# Patient Record
Sex: Female | Born: 1935 | Race: White | Hispanic: No | Smoking: Never smoker
Health system: Southern US, Community
[De-identification: ages and names within clinical notes are randomized; demographics above are authoritative.]

## PROBLEM LIST (undated history)

## (undated) DIAGNOSIS — C50919 Malignant neoplasm of unspecified site of unspecified female breast: Secondary | ICD-10-CM

## (undated) DIAGNOSIS — I4729 Other ventricular tachycardia: Secondary | ICD-10-CM

## (undated) DIAGNOSIS — Z923 Personal history of irradiation: Secondary | ICD-10-CM

## (undated) DIAGNOSIS — K449 Diaphragmatic hernia without obstruction or gangrene: Secondary | ICD-10-CM

## (undated) DIAGNOSIS — L539 Erythematous condition, unspecified: Secondary | ICD-10-CM

## (undated) DIAGNOSIS — I472 Ventricular tachycardia: Secondary | ICD-10-CM

## (undated) DIAGNOSIS — R58 Hemorrhage, not elsewhere classified: Secondary | ICD-10-CM

## (undated) DIAGNOSIS — K219 Gastro-esophageal reflux disease without esophagitis: Secondary | ICD-10-CM

## (undated) DIAGNOSIS — R918 Other nonspecific abnormal finding of lung field: Secondary | ICD-10-CM

## (undated) DIAGNOSIS — C50912 Malignant neoplasm of unspecified site of left female breast: Principal | ICD-10-CM

## (undated) DIAGNOSIS — I10 Essential (primary) hypertension: Secondary | ICD-10-CM

## (undated) DIAGNOSIS — D649 Anemia, unspecified: Secondary | ICD-10-CM

## (undated) DIAGNOSIS — Z79818 Long term (current) use of other agents affecting estrogen receptors and estrogen levels: Secondary | ICD-10-CM

## (undated) DIAGNOSIS — D509 Iron deficiency anemia, unspecified: Secondary | ICD-10-CM

## (undated) HISTORY — DX: Ventricular tachycardia: I47.2

## (undated) HISTORY — DX: Malignant neoplasm of unspecified site of left female breast: C50.912

## (undated) HISTORY — DX: Essential (primary) hypertension: I10

## (undated) HISTORY — DX: Diaphragmatic hernia without obstruction or gangrene: K44.9

## (undated) HISTORY — DX: Anemia, unspecified: D64.9

## (undated) HISTORY — DX: Iron deficiency anemia, unspecified: D50.9

## (undated) HISTORY — DX: Other ventricular tachycardia: I47.29

## (undated) HISTORY — DX: Malignant neoplasm of unspecified site of unspecified female breast: C50.919

## (undated) HISTORY — DX: Gastro-esophageal reflux disease without esophagitis: K21.9

## (undated) HISTORY — DX: Personal history of irradiation: Z92.3

---

## 2008-08-23 ENCOUNTER — Inpatient Hospital Stay (HOSPITAL_COMMUNITY): Admission: EM | Admit: 2008-08-23 | Discharge: 2008-08-25 | Payer: Self-pay | Admitting: Emergency Medicine

## 2008-08-23 ENCOUNTER — Ambulatory Visit: Payer: Self-pay | Admitting: Cardiology

## 2008-08-24 ENCOUNTER — Ambulatory Visit: Payer: Self-pay | Admitting: Oncology

## 2008-08-28 ENCOUNTER — Ambulatory Visit: Payer: Self-pay | Admitting: Oncology

## 2008-09-20 ENCOUNTER — Ambulatory Visit: Payer: Self-pay | Admitting: Gastroenterology

## 2008-12-26 ENCOUNTER — Encounter (HOSPITAL_COMMUNITY): Admission: RE | Admit: 2008-12-26 | Discharge: 2009-02-07 | Payer: Self-pay | Admitting: Oncology

## 2008-12-26 ENCOUNTER — Ambulatory Visit: Payer: Self-pay | Admitting: Oncology

## 2008-12-26 LAB — CBC WITH DIFFERENTIAL/PLATELET
Basophils Absolute: 0.1 10*3/uL (ref 0.0–0.1)
Eosinophils Absolute: 0 10*3/uL (ref 0.0–0.5)
HCT: 16.8 % — ABNORMAL LOW (ref 34.8–46.6)
HGB: 4.2 g/dL — CL (ref 11.6–15.9)
MONO#: 0.5 10*3/uL (ref 0.1–0.9)
NEUT%: 67.4 % (ref 38.4–76.8)
WBC: 6.4 10*3/uL (ref 3.9–10.3)
lymph#: 1.5 10*3/uL (ref 0.9–3.3)

## 2008-12-26 LAB — TYPE & CROSSMATCH - CHCC

## 2008-12-28 LAB — LACTATE DEHYDROGENASE: LDH: 107 U/L (ref 94–250)

## 2008-12-28 LAB — COMPREHENSIVE METABOLIC PANEL
Albumin: 4 g/dL (ref 3.5–5.2)
CO2: 22 mEq/L (ref 19–32)
Glucose, Bld: 93 mg/dL (ref 70–99)
Potassium: 4.3 mEq/L (ref 3.5–5.3)
Sodium: 136 mEq/L (ref 135–145)
Total Protein: 6.2 g/dL (ref 6.0–8.3)

## 2008-12-28 LAB — CEA: CEA: 1.5 ng/mL (ref 0.0–5.0)

## 2009-01-01 LAB — IRON AND TIBC

## 2009-01-02 ENCOUNTER — Encounter (INDEPENDENT_AMBULATORY_CARE_PROVIDER_SITE_OTHER): Payer: Self-pay | Admitting: Diagnostic Radiology

## 2009-01-02 ENCOUNTER — Other Ambulatory Visit: Admission: RE | Admit: 2009-01-02 | Discharge: 2009-01-02 | Payer: Self-pay | Admitting: Diagnostic Radiology

## 2009-01-02 ENCOUNTER — Encounter: Admission: RE | Admit: 2009-01-02 | Discharge: 2009-01-02 | Payer: Self-pay | Admitting: Oncology

## 2009-01-04 ENCOUNTER — Ambulatory Visit (HOSPITAL_COMMUNITY): Admission: RE | Admit: 2009-01-04 | Discharge: 2009-01-04 | Payer: Self-pay | Admitting: Oncology

## 2009-01-11 ENCOUNTER — Encounter: Admission: RE | Admit: 2009-01-11 | Discharge: 2009-01-11 | Payer: Self-pay | Admitting: Oncology

## 2009-01-16 LAB — IRON AND TIBC
%SAT: 18 % — ABNORMAL LOW (ref 20–55)
Iron: 61 ug/dL (ref 42–145)

## 2009-01-16 LAB — COMPREHENSIVE METABOLIC PANEL
Albumin: 3.9 g/dL (ref 3.5–5.2)
Alkaline Phosphatase: 82 U/L (ref 39–117)
BUN: 16 mg/dL (ref 6–23)
Calcium: 9.3 mg/dL (ref 8.4–10.5)
Creatinine, Ser: 0.72 mg/dL (ref 0.40–1.20)
Glucose, Bld: 94 mg/dL (ref 70–99)
Potassium: 4.9 mEq/L (ref 3.5–5.3)

## 2009-01-16 LAB — CBC WITH DIFFERENTIAL/PLATELET
BASO%: 1.1 % (ref 0.0–2.0)
EOS%: 2.3 % (ref 0.0–7.0)
HGB: 10 g/dL — ABNORMAL LOW (ref 11.6–15.9)
MCH: 26.7 pg (ref 25.1–34.0)
MCV: 84.8 fL (ref 79.5–101.0)
NEUT%: 62.9 % (ref 38.4–76.8)
lymph#: 1.6 10*3/uL (ref 0.9–3.3)

## 2009-01-16 LAB — FERRITIN: Ferritin: 794 ng/mL — ABNORMAL HIGH (ref 10–291)

## 2009-02-01 LAB — CBC WITH DIFFERENTIAL/PLATELET
BASO%: 0.3 % (ref 0.0–2.0)
EOS%: 1.3 % (ref 0.0–7.0)
HCT: 35.5 % (ref 34.8–46.6)
LYMPH%: 23.9 % (ref 14.0–49.7)
MCH: 29.9 pg (ref 25.1–34.0)
MCHC: 33.4 g/dL (ref 31.5–36.0)
NEUT%: 68.1 % (ref 38.4–76.8)
Platelets: 184 10*3/uL (ref 145–400)

## 2009-02-01 LAB — VITAMIN B12: Vitamin B-12: 296 pg/mL (ref 211–911)

## 2009-02-13 ENCOUNTER — Ambulatory Visit: Payer: Self-pay | Admitting: Gastroenterology

## 2009-02-13 DIAGNOSIS — R195 Other fecal abnormalities: Secondary | ICD-10-CM | POA: Insufficient documentation

## 2009-02-27 ENCOUNTER — Ambulatory Visit: Payer: Self-pay | Admitting: Gastroenterology

## 2009-02-27 ENCOUNTER — Ambulatory Visit: Payer: Self-pay | Admitting: Oncology

## 2009-03-01 LAB — COMPREHENSIVE METABOLIC PANEL
Albumin: 3.9 g/dL (ref 3.5–5.2)
Alkaline Phosphatase: 78 U/L (ref 39–117)
BUN: 13 mg/dL (ref 6–23)
Creatinine, Ser: 0.74 mg/dL (ref 0.40–1.20)
Glucose, Bld: 132 mg/dL — ABNORMAL HIGH (ref 70–99)
Total Bilirubin: 0.2 mg/dL — ABNORMAL LOW (ref 0.3–1.2)

## 2009-03-01 LAB — CBC WITH DIFFERENTIAL/PLATELET
Basophils Absolute: 0 10*3/uL (ref 0.0–0.1)
EOS%: 1.5 % (ref 0.0–7.0)
Eosinophils Absolute: 0.1 10*3/uL (ref 0.0–0.5)
HGB: 11.7 g/dL (ref 11.6–15.9)
LYMPH%: 30.1 % (ref 14.0–49.7)
MCH: 30.8 pg (ref 25.1–34.0)
MCV: 94.5 fL (ref 79.5–101.0)
MONO%: 8.3 % (ref 0.0–14.0)
NEUT#: 2.8 10*3/uL (ref 1.5–6.5)
Platelets: 163 10*3/uL (ref 145–400)
RBC: 3.8 10*6/uL (ref 3.70–5.45)

## 2009-03-01 LAB — IRON AND TIBC
%SAT: 25 % (ref 20–55)
Iron: 70 ug/dL (ref 42–145)
UIBC: 214 ug/dL

## 2009-03-01 LAB — FERRITIN: Ferritin: 348 ng/mL — ABNORMAL HIGH (ref 10–291)

## 2009-04-03 ENCOUNTER — Ambulatory Visit: Payer: Self-pay | Admitting: Oncology

## 2009-05-03 ENCOUNTER — Ambulatory Visit: Payer: Self-pay | Admitting: Oncology

## 2009-05-03 LAB — COMPREHENSIVE METABOLIC PANEL
BUN: 18 mg/dL (ref 6–23)
CO2: 22 mEq/L (ref 19–32)
Creatinine, Ser: 0.75 mg/dL (ref 0.40–1.20)
Glucose, Bld: 85 mg/dL (ref 70–99)
Total Bilirubin: 0.3 mg/dL (ref 0.3–1.2)

## 2009-05-03 LAB — CBC WITH DIFFERENTIAL/PLATELET
Eosinophils Absolute: 0.1 10*3/uL (ref 0.0–0.5)
HCT: 37 % (ref 34.8–46.6)
LYMPH%: 29.3 % (ref 14.0–49.7)
MCHC: 33.9 g/dL (ref 31.5–36.0)
MCV: 96.7 fL (ref 79.5–101.0)
MONO#: 0.4 10*3/uL (ref 0.1–0.9)
NEUT#: 3 10*3/uL (ref 1.5–6.5)
NEUT%: 60.8 % (ref 38.4–76.8)
Platelets: 231 10*3/uL (ref 145–400)
WBC: 5 10*3/uL (ref 3.9–10.3)

## 2009-05-03 LAB — IRON AND TIBC
Iron: 71 ug/dL (ref 42–145)
TIBC: 295 ug/dL (ref 250–470)

## 2009-05-03 LAB — LACTATE DEHYDROGENASE: LDH: 131 U/L (ref 94–250)

## 2009-05-03 LAB — FERRITIN: Ferritin: 251 ng/mL (ref 10–291)

## 2009-07-10 ENCOUNTER — Ambulatory Visit: Payer: Self-pay | Admitting: Oncology

## 2009-07-12 LAB — CBC WITH DIFFERENTIAL/PLATELET
Basophils Absolute: 0 10*3/uL (ref 0.0–0.1)
Eosinophils Absolute: 0.1 10*3/uL (ref 0.0–0.5)
HCT: 36.1 % (ref 34.8–46.6)
HGB: 12.5 g/dL (ref 11.6–15.9)
LYMPH%: 26 % (ref 14.0–49.7)
MCV: 98.3 fL (ref 79.5–101.0)
MONO#: 0.4 10*3/uL (ref 0.1–0.9)
MONO%: 7.2 % (ref 0.0–14.0)
NEUT#: 3.9 10*3/uL (ref 1.5–6.5)
NEUT%: 65.2 % (ref 38.4–76.8)
Platelets: 237 10*3/uL (ref 145–400)
RBC: 3.68 10*6/uL — ABNORMAL LOW (ref 3.70–5.45)
WBC: 6 10*3/uL (ref 3.9–10.3)

## 2009-07-13 LAB — COMPREHENSIVE METABOLIC PANEL
BUN: 14 mg/dL (ref 6–23)
CO2: 26 mEq/L (ref 19–32)
Glucose, Bld: 93 mg/dL (ref 70–99)
Sodium: 138 mEq/L (ref 135–145)
Total Bilirubin: 0.3 mg/dL (ref 0.3–1.2)
Total Protein: 6.6 g/dL (ref 6.0–8.3)

## 2009-07-13 LAB — LACTATE DEHYDROGENASE: LDH: 126 U/L (ref 94–250)

## 2009-07-13 LAB — IRON AND TIBC
%SAT: 23 % (ref 20–55)
Iron: 75 ug/dL (ref 42–145)
UIBC: 252 ug/dL

## 2009-09-18 ENCOUNTER — Ambulatory Visit: Payer: Self-pay | Admitting: Oncology

## 2009-10-16 ENCOUNTER — Ambulatory Visit: Payer: Self-pay | Admitting: Oncology

## 2009-10-18 LAB — CBC WITH DIFFERENTIAL/PLATELET
Basophils Absolute: 0 10*3/uL (ref 0.0–0.1)
Eosinophils Absolute: 0.1 10*3/uL (ref 0.0–0.5)
HCT: 37.8 % (ref 34.8–46.6)
HGB: 12.4 g/dL (ref 11.6–15.9)
MCH: 32.1 pg (ref 25.1–34.0)
MCV: 97.9 fL (ref 79.5–101.0)
MONO%: 7.5 % (ref 0.0–14.0)
NEUT#: 3.1 10*3/uL (ref 1.5–6.5)
NEUT%: 59.2 % (ref 38.4–76.8)
RDW: 12.7 % (ref 11.2–14.5)

## 2009-10-18 LAB — COMPREHENSIVE METABOLIC PANEL
Albumin: 4.1 g/dL (ref 3.5–5.2)
Alkaline Phosphatase: 71 U/L (ref 39–117)
BUN: 22 mg/dL (ref 6–23)
Calcium: 8.8 mg/dL (ref 8.4–10.5)
Chloride: 99 mEq/L (ref 96–112)
Creatinine, Ser: 0.79 mg/dL (ref 0.40–1.20)
Glucose, Bld: 134 mg/dL — ABNORMAL HIGH (ref 70–99)
Potassium: 4.9 mEq/L (ref 3.5–5.3)

## 2009-10-18 LAB — IRON AND TIBC
Iron: 49 ug/dL (ref 42–145)
TIBC: 354 ug/dL (ref 250–470)
UIBC: 305 ug/dL

## 2009-11-18 ENCOUNTER — Ambulatory Visit: Payer: Self-pay | Admitting: Oncology

## 2009-12-06 LAB — FERRITIN: Ferritin: 42 ng/mL (ref 10–291)

## 2010-01-08 ENCOUNTER — Ambulatory Visit: Payer: Self-pay | Admitting: Oncology

## 2010-02-07 ENCOUNTER — Ambulatory Visit: Payer: Self-pay | Admitting: Oncology

## 2010-02-07 LAB — CBC WITH DIFFERENTIAL/PLATELET
BASO%: 0.3 % (ref 0.0–2.0)
Eosinophils Absolute: 0.1 10*3/uL (ref 0.0–0.5)
HCT: 32.3 % — ABNORMAL LOW (ref 34.8–46.6)
LYMPH%: 25 % (ref 14.0–49.7)
MCHC: 32.7 g/dL (ref 31.5–36.0)
MONO#: 0.5 10*3/uL (ref 0.1–0.9)
NEUT%: 65.1 % (ref 38.4–76.8)
Platelets: 307 10*3/uL (ref 145–400)
WBC: 5.6 10*3/uL (ref 3.9–10.3)

## 2010-02-07 LAB — COMPREHENSIVE METABOLIC PANEL
BUN: 15 mg/dL (ref 6–23)
CO2: 27 mEq/L (ref 19–32)
Calcium: 8.7 mg/dL (ref 8.4–10.5)
Chloride: 105 mEq/L (ref 96–112)
Creatinine, Ser: 0.74 mg/dL (ref 0.40–1.20)
Glucose, Bld: 81 mg/dL (ref 70–99)

## 2010-02-07 LAB — IRON AND TIBC
Iron: 23 ug/dL — ABNORMAL LOW (ref 42–145)
TIBC: 358 ug/dL (ref 250–470)
UIBC: 335 ug/dL

## 2010-02-07 LAB — FERRITIN: Ferritin: 18 ng/mL (ref 10–291)

## 2010-02-28 LAB — CBC WITH DIFFERENTIAL/PLATELET
HCT: 30.6 % — ABNORMAL LOW (ref 34.8–46.6)
MCH: 33.9 pg (ref 25.1–34.0)
MCHC: 33.3 g/dL (ref 31.5–36.0)
MONO%: 9.3 % (ref 0.0–14.0)
Platelets: 275 10*3/uL (ref 145–400)

## 2010-02-28 LAB — FERRITIN: Ferritin: 30 ng/mL (ref 10–291)

## 2010-05-07 ENCOUNTER — Ambulatory Visit: Payer: Self-pay | Admitting: Oncology

## 2010-06-24 ENCOUNTER — Ambulatory Visit: Payer: Self-pay | Admitting: Oncology

## 2010-06-27 ENCOUNTER — Ambulatory Visit (HOSPITAL_COMMUNITY)
Admission: RE | Admit: 2010-06-27 | Discharge: 2010-06-27 | Payer: Self-pay | Source: Home / Self Care | Admitting: Oncology

## 2010-06-27 LAB — CBC WITH DIFFERENTIAL/PLATELET
Basophils Absolute: 0 10*3/uL (ref 0.0–0.1)
Eosinophils Absolute: 0.1 10*3/uL (ref 0.0–0.5)
HGB: 12.6 g/dL (ref 11.6–15.9)
LYMPH%: 24.5 % (ref 14.0–49.7)
MCV: 97.7 fL (ref 79.5–101.0)
MONO%: 8.2 % (ref 0.0–14.0)
NEUT#: 3.8 10*3/uL (ref 1.5–6.5)
NEUT%: 64.5 % (ref 38.4–76.8)
Platelets: 235 10*3/uL (ref 145–400)

## 2010-06-30 LAB — COMPREHENSIVE METABOLIC PANEL
Albumin: 3.8 g/dL (ref 3.5–5.2)
Alkaline Phosphatase: 75 U/L (ref 39–117)
BUN: 17 mg/dL (ref 6–23)
Creatinine, Ser: 0.79 mg/dL (ref 0.40–1.20)
Glucose, Bld: 117 mg/dL — ABNORMAL HIGH (ref 70–99)
Total Bilirubin: 0.2 mg/dL — ABNORMAL LOW (ref 0.3–1.2)

## 2010-06-30 LAB — IRON AND TIBC
%SAT: 34 % (ref 20–55)
Iron: 120 ug/dL (ref 42–145)
UIBC: 231 ug/dL

## 2010-06-30 LAB — FERRITIN: Ferritin: 38 ng/mL (ref 10–291)

## 2010-07-04 ENCOUNTER — Encounter: Admission: RE | Admit: 2010-07-04 | Discharge: 2010-07-04 | Payer: Self-pay | Admitting: Oncology

## 2010-09-03 ENCOUNTER — Ambulatory Visit: Payer: Self-pay | Admitting: Oncology

## 2010-09-05 ENCOUNTER — Encounter: Payer: Self-pay | Admitting: Gastroenterology

## 2010-09-05 LAB — CBC WITH DIFFERENTIAL/PLATELET
BASO%: 1.3 % (ref 0.0–2.0)
Basophils Absolute: 0.1 10*3/uL (ref 0.0–0.1)
EOS%: 2.2 % (ref 0.0–7.0)
HGB: 12.8 g/dL (ref 11.6–15.9)
MCH: 33.5 pg (ref 25.1–34.0)
MONO#: 0.5 10*3/uL (ref 0.1–0.9)
RDW: 13 % (ref 11.2–14.5)
WBC: 5.9 10*3/uL (ref 3.9–10.3)
lymph#: 1.6 10*3/uL (ref 0.9–3.3)

## 2010-09-08 LAB — COMPREHENSIVE METABOLIC PANEL
AST: 16 U/L (ref 0–37)
Albumin: 4.1 g/dL (ref 3.5–5.2)
Alkaline Phosphatase: 75 U/L (ref 39–117)
Chloride: 102 mEq/L (ref 96–112)
Potassium: 4.8 mEq/L (ref 3.5–5.3)
Sodium: 136 mEq/L (ref 135–145)
Total Protein: 6.3 g/dL (ref 6.0–8.3)

## 2010-09-08 LAB — VITAMIN D 25 HYDROXY (VIT D DEFICIENCY, FRACTURES): Vit D, 25-Hydroxy: 78 ng/mL (ref 30–89)

## 2010-09-08 LAB — TRANSFERRIN RECEPTOR, SOLUABLE: Transferrin Receptor, Soluble: 13.6 nmol/L

## 2010-09-08 LAB — IRON AND TIBC: %SAT: 20 % (ref 20–55)

## 2010-10-26 ENCOUNTER — Encounter (HOSPITAL_COMMUNITY): Payer: Self-pay | Admitting: Oncology

## 2010-11-04 NOTE — Letter (Signed)
Summary: [facility]  [facility]   Imported By: Sherian Rein 09/12/2010 07:43:02  _____________________________________________________________________  External Attachment:    Type:   Image     Comment:   External Document

## 2010-12-05 ENCOUNTER — Encounter (HOSPITAL_BASED_OUTPATIENT_CLINIC_OR_DEPARTMENT_OTHER): Payer: Medicare Other | Admitting: Oncology

## 2010-12-05 ENCOUNTER — Other Ambulatory Visit (HOSPITAL_COMMUNITY): Payer: Self-pay | Admitting: Oncology

## 2010-12-05 DIAGNOSIS — D509 Iron deficiency anemia, unspecified: Secondary | ICD-10-CM

## 2010-12-05 DIAGNOSIS — C50919 Malignant neoplasm of unspecified site of unspecified female breast: Secondary | ICD-10-CM

## 2010-12-05 DIAGNOSIS — D649 Anemia, unspecified: Secondary | ICD-10-CM

## 2010-12-05 DIAGNOSIS — Z170 Estrogen receptor positive status [ER+]: Secondary | ICD-10-CM

## 2010-12-05 LAB — CBC WITH DIFFERENTIAL/PLATELET
Basophils Absolute: 0 10*3/uL (ref 0.0–0.1)
EOS%: 1.3 % (ref 0.0–7.0)
MCH: 33.3 pg (ref 25.1–34.0)
MCHC: 33.6 g/dL (ref 31.5–36.0)
MCV: 99.2 fL (ref 79.5–101.0)
MONO%: 7.1 % (ref 0.0–14.0)
RBC: 3.65 10*6/uL — ABNORMAL LOW (ref 3.70–5.45)
RDW: 13.2 % (ref 11.2–14.5)

## 2010-12-10 LAB — COMPREHENSIVE METABOLIC PANEL
ALT: 13 U/L (ref 0–35)
AST: 19 U/L (ref 0–37)
Albumin: 4.2 g/dL (ref 3.5–5.2)
BUN: 18 mg/dL (ref 6–23)
CO2: 26 mEq/L (ref 19–32)
Calcium: 8.8 mg/dL (ref 8.4–10.5)
Chloride: 100 mEq/L (ref 96–112)
Creatinine, Ser: 0.84 mg/dL (ref 0.40–1.20)
Potassium: 4.5 mEq/L (ref 3.5–5.3)

## 2010-12-10 LAB — VITAMIN B12: Vitamin B-12: 643 pg/mL (ref 211–911)

## 2010-12-10 LAB — IRON AND TIBC: UIBC: 247 ug/dL

## 2010-12-10 LAB — VITAMIN D 25 HYDROXY (VIT D DEFICIENCY, FRACTURES): Vit D, 25-Hydroxy: 66 ng/mL (ref 30–89)

## 2010-12-10 LAB — LACTATE DEHYDROGENASE: LDH: 135 U/L (ref 94–250)

## 2011-01-15 LAB — CROSSMATCH: Antibody Screen: NEGATIVE

## 2011-01-15 LAB — ABO/RH: ABO/RH(D): A POS

## 2011-02-17 NOTE — Consult Note (Signed)
NAMECHAU, SAVELL NO.:  [id number]   MEDICAL RECORD NO.:  [id number]          PATIENT TYPE:  INP   LOCATION:  2036                         FACILITY:  [facility]   PHYSICIAN:  Samul Dada, M.D.DATE OF BIRTH:  13-Feb-1922   DATE OF CONSULTATION:  08/24/2008  DATE OF DISCHARGE:  08/25/2008                                 CONSULTATION   Room number 6527.   CONSULTING PHYSICIAN:  Dr. Arline Asp.   REASON FOR CONSULTATION:  Breast masses.   REFERRING PHYSICIAN:  Dr. Karilyn Cota.   HISTORY OF PRESENT ILLNESS:  Ms. Rozman is an 77 year old woman, with no  prior medical history, who presented to her [facility]  physicians with increased weakness for 1 week, fatigue and very short of  breath with minimal exertion.  She also complained of a hematoma in the  right breast, since her dog had jumped on her about 2 months ago.  She  stated that once the bruising resolved, a mass appeared in that breast.  A chest x-ray and a CT of the chest with contrast on August 23, 2008  revealed oval fluid density structures, superficial to the right  pectoralis muscle, immediately abutting a 7x5.2-cm heterogeneous right  breast mass, as well as the lobulated 3.8 x 3.2 cm left central breast  mass.  Left axillary lymphadenopathy noted with dominant lymph node  measuring 1.9 cm.  There is bilateral pleural effusion, and the right  upper lobe 4 mm nodule of unknown significance.  In the left lung, no  masses are seen.  No lytic or sclerotic lesions.  Of note, she was also  found to be severely anemic with a hemoglobin at [facility]  of 3.7, with a repeat hemoglobin of 4 once admitted to [facility].  To this date, she received 4 units of packed RBCs, with some  improvement.  Her hemoccult was negative.  Prior to that at the  [facility] we were asked to see her, with recommendations  regarding her care.   PAST MEDICAL HISTORY:  1. GERD - hiatal  hernia.  2. No diagnosis of hypertension this hospitalization.  3. Status post one run of VTach during this hospitalization.   SURGERIES:  None.   ALLERGIES:  NKDA.   MEDICATIONS:  Lasix as directed, Protonix 40 mg b.i.d., K-Dur 40 mEq as  directed, Zofran 4 mg q.6h.   REVIEW OF SYSTEMS:  Essentially negative with the exception of dyspnea  on exertion, GERD symptoms fatigue. Denying any fever, chills, night  sweats, headaches, confusion, double vision, or dysphagia.  No cough,  chest pain or abdominal pain.  No nausea, vomiting, diarrhea or  constipation.  Denies any blood in the stools or hematuria.  No dysuria.  No back pain, numbness or edema.  No weight loss or failure to thrive.  She admits to eating significant amounts of crushed ice.  She denies any  significant caffeine intake.   FAMILY HISTORY:  Mother died of old age at 55.  Father died of old age.  Her family history is noncontributory for  any oncologic issues or blood  disorders.   SOCIAL HISTORY:  The patient is widowed.  She has four children, two  daughters and two sons.  Supportive family.  She lives alone.  No  tobacco or alcohol history.  Lives in [place], [place].  She is  full code. On health maintenance, she has never had a colonoscopy or  EGD.  No mammogram for many years.  She has not been followed up by a  primary care physician for a long period of time.   PHYSICAL EXAM:  GENERAL APPEARANCE:  This is a well-developed, well-  nourished 77 year old white female in no acute distress, alert and  oriented x3, who looks younger than her stated age.  VITAL SIGNS:  Blood pressure 154/76, pulse 82, respirations 23,  temperature 98.2, pulse oximetry 98% on room air.  HEENT:  Normocephalic, atraumatic.  Sclerae are anicteric.  Oral cavity  without lesions or thrush.  NECK:  Supple.  No cervical or supraclavicular masses.  LUNGS:  Remarkable for right wheezing, and decreased breath sounds at  the bases.  No  rhonchi or rales.  No axillary masses palpable despite  the known left axillary lymphadenopathy per CT.  CARDIOVASCULAR:  Regular rate and rhythm without murmurs, rubs or  gallops.  ABDOMEN:  Soft, nontender.  Bowel sounds x4.  No hepatosplenomegaly.  EXTREMITIES:  With no clubbing or cyanosis.  No edema.  No inguinal  masses.  SKIN:  Remarkable for mild bruising at the puncture sites. No petechial  rash.  No open lesions.  BREASTS:  Remarkable for a 15x10-cm disk discrete right mass extending  from the axillary area to below the nipple.  There is no nipple  retraction.  No nipple discharge.  On the left, there is also a mass of  5x5-cm, with nipple inversion and skin tightening in the area.  There is  a mole above that nipple, suspicious.  No discharge.  GU and RECTAL:  Deferred.  MUSCULOSKELETAL:  No spinal tenderness.  NEURO:  Nonfocal.   LABS:  Hemoglobin 11.9, was 4 on admission. Hematocrit 37.4 and was 13.5  on admission. White count 9.5, platelets 297, MCV 78.2, was 61  initially. Sodium 138, potassium 3.9, BUN 9, creatinine 0.71, glucose  109, total bilirubin  0.2, alkaline phosphatase 65, AST 17, ALT 9, total  protein 5.2, albumin 3, calcium 8.5, hemoccult negative.   ASSESSMENT/PLAN:  Dr. Arline Asp has seen and evaluated the patient and  reviewed the chart.  He discussed the case with her and her adult  children.  The patient has 3 main problems.  1. Iron-deficiency anemia status post 4 units of packed red blood      cells.  The patient needs a gastrointestinal workup despite the      negative stool x1.  This can be done as an outpatient.  2. Clinical left breast cancer with positive lymph nodes on physical      exam and chest computed tomography.  The mass in the left upper      quadrant is 5x5-cm with nipple inversion and skin tightening.  She      needs workup at the breast center.  We recommend mammogram,      ultrasound, and biopsy.  3. Right breast mass 10 x 15 cm,  fluctuant, hopefully benign.  May be      resolving hematoma but we need to rule out cancer asper #2.  Dr.      Arline Asp spoke with the [facility]  physician.  The patient is      alright to be      discharged from our perspective, and can be followed at the cancer      center.  In the meantime, LDH, CEA and CA 27, 29 will be ordered.      In addition, would recommend for her anemia to order an anemia      panel.   Thank you very much for allowing [place] the opportunity to participate in  the care of this nice patient.      Marlowe Kays, P.A.      Samul Dada, M.D.  Electronically Signed    SW/MEDQ  D:  08/27/2008  T:  08/27/2008  Job:  [id number]   cc:   Samul Dada, M.D.

## 2011-02-17 NOTE — Discharge Summary (Signed)
[name], [name]                 ACCOUNT NO.:  [id number]   MEDICAL RECORD NO.:  [id number]          PATIENT TYPE:  INP   LOCATION:  2036                         FACILITY:  [facility]   PHYSICIAN:  Michelene Gardener, MD    DATE OF BIRTH:  [birth year]   DATE OF ADMISSION:  08/23/2008  DATE OF DISCHARGE:  08/25/2008                               DISCHARGE SUMMARY   DISCHARGE DIAGNOSES:  1. Breast masses bilaterally.  The left one is consistent with a      clinical breast cancer and the right one is more suggestive of a      benign tumor.  2. Severe anemia.  3. Hypertension.  4. Nonsustained ventricular tachycardia.  5. Hypertension.   DISCHARGE MEDICATIONS:  1. Metoprolol 25 mg twice daily.  2. Aspirin 81 mg once a day.  3. Prilosec 20 mg once a day.   CONSULTATIONS:  1. Oncology consult.  2. Cardiology consult.   PROCEDURES:  None.   RADIOLOGY STUDIES:  1. Chest x-ray on August 23, 2008, showed cardiomegaly with      bibasilar atelectasis and hiatal hernia.  2. CT scan of the chest without contrast on August 23, 2008, showed      bilateral breast masses, which is worrisome for breast cancer, and      it showed left axillary lymphadenopathy, which is worrisome for      metastasis and small bilateral pleural effusion and right upper      lobe nodules.  Follow up with Oncology next week.   COURSE OF HOSPITALIZATION:  1. Bilateral breast masses.  This patient has extensive masses in      bilateral breast, the left one is suspicious for malignant process.      The right one looks mostly benign tumor.  The patient underwent a      CT scan of the chest and results were mentioned above.  Oncology      evaluation was done, and the patient is recommended for outpatient      workup where she will undergo ultrasound and then she will get      mammography.  The patient will follow with Cancer Center where she      will call on Monday and then she will follow with them.  2. Severe  anemia.  The patient received 4 units of packed RBC, last      hemoglobin is 9.8.  Again, I spoke with Gastroenterology to      evaluate her in the hospital, but they will not be able to see      until tomorrow.  I discussed those with Oncology and with the      patient, the patient feels fine to follow as an outpatient.  Her      current occult blood is negative.  The patient was set up for      outpatient GI followup with Oncology.  3. Hypertension.  Systolic blood pressure remains above 160.  The      patient was started on metoprolol and her blood pressure improved  to 120s.  4. Nonsustained V-tach.  The patient has a 1 episode of nonsustained V-      tach and she remains asymptomatic.  I      started her on metoprolol.  Cardiology evaluation was done and no      aggressive treatment is recommended at this time.   TOTAL ASSESSMENT TIME:  40 minutes.      Michelene Gardener, MD  Electronically Signed     NAE/MEDQ  D:  08/25/2008  T:  08/25/2008  Job:  [id number]

## 2011-02-17 NOTE — Consult Note (Signed)
[name] NO.:  [id number]   MEDICAL RECORD NO.:  [id number]          PATIENT TYPE:  INP   LOCATION:  2036                         FACILITY:  [facility]   PHYSICIAN:  Jesse Sans. Wall, MD, FACCDATE OF BIRTH:  Dec 29, 1921   DATE OF CONSULTATION:  08/24/2008  DATE OF DISCHARGE:                                 CONSULTATION   REFERRING:  [facility] D [facility]   CHIEF COMPLAINT:  Wide complex tachycardia.   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   She had hemoglobin of 4 on admission.  She has got bilateral breast mass  and probably metastatic disease to the left axilla as well as to the  lungs with bilateral pleural effusions.   She has no previous cardiac history .  __________angina, dyspnea on  exertion __________. She denies orthopnea, PND or peripheral edema.   PAST MEDICAL HISTORY:  Negative.  She has __________ in the hospital  before.   PAST SURGICAL HISTORY:  None.   ALLERGIES:  She has no known drug allergies.   MEDICATIONS:  She is currently on aspirin, Plavix, Lasix, Toprolol  __________ b.i.d.   SOCIAL HISTORY:  She lives alone.  She __________ drink.  She is widowed  and has 4 children.   FAMILY HISTORY:  Noncontributory.   REVIEW OF SYSTEMS:  As per HPI is negative.  She does not __________  breast mass.   PHYSICAL EXAMINATION:  VITALS:  Blood pressure 154/96, pulse 72 and  regular, temp is 98.2, respiratory rate is 23.  She is in no acute distress.  She looks much younger than stated age.  HEENT:  Other than above is unremarkable.  NECK:  Supple.  Carotids are 2+ without bruit or JVD.  Thyroid is not  enlarged.  Trachea is midline.    LUNGS:  Clear except for decreased breath sounds in the bases.  HEART:  Regular rate and rhythm.  __________ Normal S1 and S2.  ABDOMEN:  Soft.  Bowel sounds  present without bruits.  EXTREMITIES:  No cyanosis, clubbing or edema.  Pulses intact.  NEURO:  Grossly intact.   Her labs reveal hemoglobin 11.9 after 4 units of blood.  Potassium 3.9.  Normal coag factors.  EKG which shows normal sinus rhythm at the rate of  98 beats per minute.  There are no changes.   Radiographic studies showed bilateral breast mass suspicious for breast  CA with left axillary lymphadenopathy and bilateral pleural effusions.   ASSESSMENT:  Nonsustained ventricular tachycardia, asymptomatic.   PLAN:  1. 2-D echocardiogram.  2. Beta-blockade with an increased dose as tolerated.  We will      increase her dose today to 50 mg p.o. b.i.d.  3. Check TSH.      Thomas C. Daleen Squibb, MD, [facility]  Electronically Signed     TCW/MEDQ  D:  08/24/2008  T:  08/25/2008  Job:  [id number]

## 2011-02-17 NOTE — H&P (Signed)
[name], [name]                 ACCOUNT NO.:  [id number]   MEDICAL RECORD NO.:  [id number]          PATIENT TYPE:  INP   LOCATION:  6527                         FACILITY:  [facility]   PHYSICIAN:  Thomasenia Bottoms, MDDATE OF BIRTH:  07/25/1922   DATE OF ADMISSION:  08/23/2008  DATE OF DISCHARGE:                              HISTORY & PHYSICAL   CHIEF COMPLAINT:  Weakness.   HISTORY OF PRESENT ILLNESS:  Ms. Kosiba is a pleasant 77 year old with no  past medical history, who presented to an urgent care facility today  because of about a week of generalized weakness.  The patient also  reports that she gets very short of breath with minimal activity.  This  is not her baseline.  The patient is usually quite active and  independent.  She has been having trouble with leg cramps for several  weeks and having the desire to crunch eyes.  She has not had any  hematemesis.  No bright red blood per rectum.  No hematuria.  She has  not seen any blood.  No bleeding at all.  She denies any fever.  No  chest pain.  No lower extremity edema.  The patient reports that 2-3  months ago, her 19-pound dog jumped on her chest when she was in the bed  early in the morning.  The patient initially had some bruising, more of  the right breast greater than left and a couple of days later large knot  appeared on her breast and that has been there basically for the last  couple of months since this happened.  All of the bruising has since  resolved, but the big knot has remained.  She says it does not hurt at  all any longer.   Her past medical history is significant for no medical problems except  for what she describes as some mild heartburn symptoms.   Her medications on arrival, she takes an aspirin daily and Prilosec over  the counter.   REVIEW OF SYSTEMS:  CONSTITUTIONAL:  No weight loss.  No night sweats.  Appetite is excellent.  HEENT:  No headache.  No sore throat.  CARDIOVASCULAR:  No chest pain.   No lower extremity edema.  No  orthopnea.  RESPIRATORY:  She gets short of breath with short walks in  the last several weeks.  NEUROLOGIC:  She has been having some leg  cramps.  She also reports some diffuse weakness.  No asymmetric  weakness.  No paresthesias.  All other systems reviewed and are  negative.   PHYSICAL EXAMINATION:  VITAL SIGNS:  In the emergency department, her  temperature is 98.2, blood pressure 174/69, respiratory rate 14, and O2  sats 99% on room air.  GENERAL:  The patient is pale and swallow appearing.  HEENT:  Normocephalic and atraumatic.  Her pupils are equal and round.  Her sclerae are nonicteric.  Conjunctivae are pale.  Oral mucosa moist.  NECK:  Supple.  No lymphadenopathy.  No thyromegaly.  No jugular venous  distention.  CARDIAC:  Regular rate and rhythm.  She is  not tachycardiac.  CHEST:  The patient's breast, she has smooth, firm mass in her upper  right breast.  It is approximately 13 mm long and 5-6 cm deep.  It  extends from her upper chest wall, down into her breast.  In the left  breast, she has a smaller area closer to the nipple.  The left side is  approximately 4 mm long x 2 cm wide.  Both sides are nontender.  There  are no skin changes.  She has nipple discharge.  She has no palpable  lymphadenopathy of her neck or her axilla region.  LUNGS:  Clear to auscultation bilaterally.  No wheezes, no rhonchi, no  rales.  ABDOMEN:  Soft, nontender, and nondistended.  Normoactive bowel sounds.  No hepatosplenomegaly.  NEUROLOGIC:  She is alert and oriented x3.  Her cranial nerves II-XII  are intact grossly.  She has 5/5 strength in her upper and lower  extremities.  Her sensory exam is intact grossly in her upper lower  extremities.  She has normal muscle tone and bulk.  SKIN:  Intact.  No open lesions.  No rashes at all, particularly of her  breast, no dimpling and the skin of her breast appears normal.   LABORATORY DATA:  The patient's sodium is  138, potassium 3.7, chloride  106, bicarb 25, glucose 141, BUN 11, and creatinine 0.7.  AST is normal.  Albumin is low at 3.0.  Her white count is 6.2 and platelet count is  297.  Her chest x-ray reveals cardiac enlargement, questionable hiatal  hernia, bibasilar atelectasis, bronchitic changes, bony demineralization  with old right rib fractures, question of small left pleural effusion.   ASSESSMENT AND PLAN:  Severe symptomatic anemia secondary to blood loss.  The patient did have a stool guaiac which was negative.  We will  transfuse her starting immediately.  I suspect that these are hematomas  in her breasts given her recent trauma and that is the source of the  blood loss, so we will get a CT scan of her chest to evaluate these  masses and proceed from there.  I will put her on IV Protonix  empirically as we continue to guaiac her stools just until we are sure  that the patient does not have a GI bleed.  If the CT can not delineate  between the hematoma versus malignancy, she certainly will require  further workup.  We will hold the patient's aspirin in the meantime.  She will need a primary care physician at discharge.      Thomasenia Bottoms, MD  Electronically Signed     CVC/MEDQ  D:  08/23/2008  T:  08/24/2008  Job:  [id number]

## 2011-03-20 ENCOUNTER — Other Ambulatory Visit (HOSPITAL_COMMUNITY): Payer: Self-pay | Admitting: Oncology

## 2011-03-20 ENCOUNTER — Encounter (HOSPITAL_BASED_OUTPATIENT_CLINIC_OR_DEPARTMENT_OTHER): Payer: Medicare Other | Admitting: Oncology

## 2011-03-20 DIAGNOSIS — C50419 Malignant neoplasm of upper-outer quadrant of unspecified female breast: Secondary | ICD-10-CM

## 2011-03-20 DIAGNOSIS — D509 Iron deficiency anemia, unspecified: Secondary | ICD-10-CM

## 2011-03-20 DIAGNOSIS — C773 Secondary and unspecified malignant neoplasm of axilla and upper limb lymph nodes: Secondary | ICD-10-CM

## 2011-03-20 DIAGNOSIS — D649 Anemia, unspecified: Secondary | ICD-10-CM

## 2011-03-20 DIAGNOSIS — Z170 Estrogen receptor positive status [ER+]: Secondary | ICD-10-CM

## 2011-03-20 DIAGNOSIS — C50919 Malignant neoplasm of unspecified site of unspecified female breast: Secondary | ICD-10-CM

## 2011-03-20 LAB — CBC WITH DIFFERENTIAL/PLATELET
BASO%: 0.6 % (ref 0.0–2.0)
EOS%: 0.9 % (ref 0.0–7.0)
HCT: 39.2 % (ref 34.8–46.6)
HGB: 13.3 g/dL (ref 11.6–15.9)
MCH: 32.3 pg (ref 25.1–34.0)
MCHC: 33.9 g/dL (ref 31.5–36.0)
MONO#: 0.4 10*3/uL (ref 0.1–0.9)
NEUT%: 68.1 % (ref 38.4–76.8)
RDW: 13.2 % (ref 11.2–14.5)
WBC: 5.7 10*3/uL (ref 3.9–10.3)
lymph#: 1.3 10*3/uL (ref 0.9–3.3)

## 2011-03-20 LAB — COMPREHENSIVE METABOLIC PANEL
ALT: 11 U/L (ref 0–35)
AST: 15 U/L (ref 0–37)
Albumin: 4.1 g/dL (ref 3.5–5.2)
CO2: 27 mEq/L (ref 19–32)
Calcium: 9.3 mg/dL (ref 8.4–10.5)
Chloride: 102 mEq/L (ref 96–112)
Potassium: 4.3 mEq/L (ref 3.5–5.3)
Total Protein: 6.5 g/dL (ref 6.0–8.3)

## 2011-03-20 LAB — LACTATE DEHYDROGENASE: LDH: 134 U/L (ref 94–250)

## 2011-07-08 LAB — CROSSMATCH

## 2011-07-08 LAB — CBC
HCT: 13.5 — ABNORMAL LOW
HCT: 37.4
Hemoglobin: 4 — CL
MCHC: 29.6 — ABNORMAL LOW
MCHC: 32.2
MCV: 62.5 — ABNORMAL LOW
MCV: 77.2 — ABNORMAL LOW
MCV: 78.2
Platelets: 237
Platelets: ADEQUATE
RBC: 2.16 — ABNORMAL LOW
RBC: 3.94
RBC: 4.79
RDW: 25.2 — ABNORMAL HIGH
WBC: 6.2
WBC: 9.5

## 2011-07-08 LAB — BASIC METABOLIC PANEL
Chloride: 105
GFR calc Af Amer: >60
GFR calc non Af Amer: >60
Potassium: 3.9

## 2011-07-08 LAB — COMPREHENSIVE METABOLIC PANEL
AST: 17
BUN: 11
CO2: 25
Calcium: 8.1 — ABNORMAL LOW
Chloride: 106
Creatinine, Ser: 0.7
GFR calc Af Amer: >60
GFR calc non Af Amer: >60
Glucose, Bld: 141 — ABNORMAL HIGH
Total Bilirubin: 0.2 — ABNORMAL LOW

## 2011-07-08 LAB — RETICULOCYTES
RBC.: 4.31
Retic Count, Absolute: 60.3
Retic Ct Pct: 1.4

## 2011-07-08 LAB — MAGNESIUM: Magnesium: 2.2

## 2011-07-08 LAB — PROTIME-INR: Prothrombin Time: 12.9

## 2011-07-08 LAB — DIFFERENTIAL
Basophils Absolute: 0.1
Eosinophils Absolute: 0.1
Lymphocytes Relative: 14
Neutro Abs: 4.7

## 2011-07-08 LAB — IRON AND TIBC: TIBC: 512 — ABNORMAL HIGH

## 2011-07-08 LAB — CANCER ANTIGEN 27.29: CA 27.29: 29

## 2011-07-08 LAB — FERRITIN: Ferritin: 25 (ref 10–291)

## 2011-07-08 LAB — OCCULT BLOOD X 1 CARD TO LAB, STOOL: Fecal Occult Bld: NEGATIVE

## 2011-07-08 LAB — FOLATE: Folate: >20

## 2011-07-08 LAB — PREPARE RBC (CROSSMATCH)

## 2011-07-08 LAB — VITAMIN B12: Vitamin B-12: 274 (ref 211–911)

## 2011-07-08 LAB — APTT: aPTT: 22 — ABNORMAL LOW

## 2011-07-17 ENCOUNTER — Other Ambulatory Visit (HOSPITAL_COMMUNITY): Payer: Self-pay | Admitting: Oncology

## 2011-07-17 DIAGNOSIS — Z853 Personal history of malignant neoplasm of breast: Secondary | ICD-10-CM

## 2011-07-31 ENCOUNTER — Ambulatory Visit
Admission: RE | Admit: 2011-07-31 | Discharge: 2011-07-31 | Disposition: A | Discharge status: Home / Self Care | Payer: Medicare Other | Source: Ambulatory Visit | Attending: Oncology | Admitting: Oncology

## 2011-07-31 DIAGNOSIS — Z853 Personal history of malignant neoplasm of breast: Secondary | ICD-10-CM

## 2011-08-21 ENCOUNTER — Other Ambulatory Visit (HOSPITAL_COMMUNITY): Payer: Self-pay | Admitting: Oncology

## 2011-09-25 ENCOUNTER — Telehealth: Payer: Self-pay | Admitting: Oncology

## 2011-09-25 NOTE — Telephone Encounter (Signed)
l/m w/ new appt info and also mailed    aom

## 2011-10-12 ENCOUNTER — Ambulatory Visit: Payer: Medicare Other | Admitting: Oncology

## 2011-10-12 ENCOUNTER — Other Ambulatory Visit: Payer: Medicare Other | Admitting: Lab

## 2011-10-14 ENCOUNTER — Other Ambulatory Visit: Payer: Self-pay | Admitting: *Deleted

## 2011-10-14 DIAGNOSIS — C773 Secondary and unspecified malignant neoplasm of axilla and upper limb lymph nodes: Secondary | ICD-10-CM

## 2011-10-14 DIAGNOSIS — D509 Iron deficiency anemia, unspecified: Secondary | ICD-10-CM

## 2011-10-14 DIAGNOSIS — C50419 Malignant neoplasm of upper-outer quadrant of unspecified female breast: Secondary | ICD-10-CM

## 2011-10-14 DIAGNOSIS — Z170 Estrogen receptor positive status [ER+]: Secondary | ICD-10-CM

## 2011-10-14 DIAGNOSIS — I10 Essential (primary) hypertension: Secondary | ICD-10-CM

## 2011-10-14 MED ORDER — METOPROLOL TARTRATE 25 MG PO TABS: 25.0000 mg | ORAL_TABLET | Freq: Two times a day (BID) | ORAL | Status: DC

## 2011-10-14 MED ORDER — LISINOPRIL 20 MG PO TABS: 20.0000 mg | ORAL_TABLET | Freq: Every day | ORAL | Status: DC

## 2011-10-16 ENCOUNTER — Other Ambulatory Visit: Payer: Self-pay

## 2011-10-16 ENCOUNTER — Telehealth: Payer: Self-pay | Admitting: Oncology

## 2011-10-16 DIAGNOSIS — D509 Iron deficiency anemia, unspecified: Secondary | ICD-10-CM

## 2011-10-16 DIAGNOSIS — C773 Secondary and unspecified malignant neoplasm of axilla and upper limb lymph nodes: Secondary | ICD-10-CM

## 2011-10-16 DIAGNOSIS — Z170 Estrogen receptor positive status [ER+]: Secondary | ICD-10-CM

## 2011-10-16 DIAGNOSIS — I10 Essential (primary) hypertension: Secondary | ICD-10-CM

## 2011-10-16 DIAGNOSIS — C50419 Malignant neoplasm of upper-outer quadrant of unspecified female breast: Secondary | ICD-10-CM

## 2011-10-16 MED ORDER — METOPROLOL TARTRATE 25 MG PO TABS: 25.0000 mg | ORAL_TABLET | Freq: Two times a day (BID) | ORAL | Status: DC

## 2011-10-16 MED ORDER — LISINOPRIL 20 MG PO TABS: 20.0000 mg | ORAL_TABLET | Freq: Every day | ORAL | Status: DC

## 2011-10-16 NOTE — Telephone Encounter (Signed)
l/m on v/m with 10/28/11 appt info.aom

## 2011-10-19 ENCOUNTER — Ambulatory Visit: Payer: Medicare Other | Admitting: Oncology

## 2011-10-19 ENCOUNTER — Other Ambulatory Visit: Payer: Self-pay | Admitting: *Deleted

## 2011-10-19 ENCOUNTER — Other Ambulatory Visit: Payer: Medicare Other | Admitting: Lab

## 2011-10-19 DIAGNOSIS — Z170 Estrogen receptor positive status [ER+]: Secondary | ICD-10-CM

## 2011-10-19 DIAGNOSIS — D649 Anemia, unspecified: Secondary | ICD-10-CM

## 2011-10-19 DIAGNOSIS — D509 Iron deficiency anemia, unspecified: Secondary | ICD-10-CM

## 2011-10-19 DIAGNOSIS — C50419 Malignant neoplasm of upper-outer quadrant of unspecified female breast: Secondary | ICD-10-CM

## 2011-10-19 DIAGNOSIS — C773 Secondary and unspecified malignant neoplasm of axilla and upper limb lymph nodes: Secondary | ICD-10-CM

## 2011-10-19 DIAGNOSIS — I10 Essential (primary) hypertension: Secondary | ICD-10-CM

## 2011-10-19 MED ORDER — LISINOPRIL 20 MG PO TABS: 20.0000 mg | ORAL_TABLET | Freq: Every day | ORAL | Status: DC

## 2011-10-28 ENCOUNTER — Telehealth: Payer: Self-pay | Admitting: Oncology

## 2011-10-28 ENCOUNTER — Other Ambulatory Visit (HOSPITAL_BASED_OUTPATIENT_CLINIC_OR_DEPARTMENT_OTHER): Payer: Medicare Other | Admitting: Lab

## 2011-10-28 ENCOUNTER — Ambulatory Visit (HOSPITAL_BASED_OUTPATIENT_CLINIC_OR_DEPARTMENT_OTHER): Payer: Medicare Other | Admitting: Oncology

## 2011-10-28 ENCOUNTER — Encounter: Payer: Self-pay | Admitting: Oncology

## 2011-10-28 VITALS — BP 193/89 | HR 63 | Temp 97.1°F | Ht 62.0 in | Wt 126.2 lb

## 2011-10-28 DIAGNOSIS — D509 Iron deficiency anemia, unspecified: Secondary | ICD-10-CM

## 2011-10-28 DIAGNOSIS — C50912 Malignant neoplasm of unspecified site of left female breast: Secondary | ICD-10-CM

## 2011-10-28 DIAGNOSIS — Z79811 Long term (current) use of aromatase inhibitors: Secondary | ICD-10-CM

## 2011-10-28 DIAGNOSIS — C50919 Malignant neoplasm of unspecified site of unspecified female breast: Secondary | ICD-10-CM

## 2011-10-28 HISTORY — DX: Malignant neoplasm of unspecified site of left female breast: C50.912

## 2011-10-28 LAB — CBC WITH DIFFERENTIAL/PLATELET
Basophils Absolute: 0 10*3/uL (ref 0.0–0.1)
EOS%: 2 % (ref 0.0–7.0)
Eosinophils Absolute: 0.1 10*3/uL (ref 0.0–0.5)
HCT: 36 % (ref 34.8–46.6)
HGB: 12.4 g/dL (ref 11.6–15.9)
MCH: 32.3 pg (ref 25.1–34.0)
MCV: 93.8 fL (ref 79.5–101.0)
MONO%: 9.8 % (ref 0.0–14.0)
NEUT#: 3 10*3/uL (ref 1.5–6.5)
NEUT%: 55.3 % (ref 38.4–76.8)
Platelets: 247 10*3/uL (ref 145–400)
RDW: 14.4 % (ref 11.2–14.5)

## 2011-10-28 LAB — COMPREHENSIVE METABOLIC PANEL
AST: 17 U/L (ref 0–37)
Albumin: 4.1 g/dL (ref 3.5–5.2)
Alkaline Phosphatase: 94 U/L (ref 39–117)
BUN: 17 mg/dL (ref 6–23)
Calcium: 8.9 mg/dL (ref 8.4–10.5)
Creatinine, Ser: 0.73 mg/dL (ref 0.50–1.10)
Glucose, Bld: 95 mg/dL (ref 70–99)
Potassium: 4.2 mEq/L (ref 3.5–5.3)

## 2011-10-28 LAB — IRON AND TIBC
TIBC: 416 ug/dL (ref 250–470)
UIBC: 371 ug/dL (ref 125–400)

## 2011-10-28 NOTE — Progress Notes (Signed)
CC:   [name]. [name], M.D. Barbara Quarry, MD Barbara Fee, MD  PROBLEM LIST: 1. Locally advanced cancer of the left breast diagnosed  with biopsy     on 01/02/2009 although changes of breast cancer were apparently     evident in November 2009.  There was clinical lymph node     involvement.  Tumor stage was T4b N1, stage IIIB.  Estrogen     receptor was 100%, progesterone receptor 99%.  Proliferative index     was 45%.  HER-2/neu was negative.  The patient declined surgery,     radiation and chemotherapy.  She was accepting of Femara which was     started on Feb 02, 2009.  The patient continues on this and has had     an excellent response to treatment. 2. Cystic masses involving the right breast with negative biopsy on     01/02/2009. 3. Iron-deficiency anemia requiring hospitalization, 4 units of blood     and intravenous iron.  The patient had positive stools but a     negative colonoscopy on 02/28/2009. 4. Hypertension. 5. Hiatal hernia. 6. Cholelithiasis. 7. History of nonsustained ventricular tachycardia.  MEDICATIONS: 1. Norvasc 5 mg. 2. Aspirin 81 mg daily. 3. Cholecalciferol 5000 units daily. 4. Ferrous sulfate 325 mg daily. 5. Femara 2.5 mg daily.  This was started on Feb 02, 2009. 6. Lisinopril 20 mg daily. 7. Lopressor 50 mg daily. 8. Multivitamin. 9. Senokot, 1 daily as needed. 10.Vitamin B12, 50 mcg daily.  HISTORY:  [name] was last seen by [place] on 03/20/2011.  She is accompanied by her daughter, [name].  There has been no significant change in her condition.  She continues to feel well.  She is active. She has not driven in a couple years.  She lives with her daughter, [name], and her family.  There is no history of any pain or respiratory problems, and the patient feels quite well.  PHYSICAL EXAM:  General:  [name] is 78 years old and quite spry for her age and well appearing.  Weight is 126 pounds.  Height 5 feet, 2 inches, body surface area 1.58 sq m.   Vital Signs:  Blood pressure today was 193/89.  The patient does have a blood pressure cuff at home, and usually her blood pressure is only minimally elevated.  When she has been here, her blood pressures have been significantly elevated.  On 03/10/2011, blood pressure was at 184/79.  HEENT:  There is no scleral icterus.  Mouth and pharynx are benign.  No adenopathy in the neck supraclavicular or axillary areas.  Heart/Lungs:  Normal.  Abdomen: Benign with no organomegaly or masses palpable.  Extremities:  No peripheral edema or clubbing.  Neurologic:  Exam is grossly normal. Lymphatics:  No lymphedema of either arm.  Breasts:  Examination of the right breast shows some.  Firmness in the 11 to 12 o'clock position but no discrete mass.  The right nipple is somewhat flat.  The left breast continues to show marked distortion consistent with a diagnosis of confirmed breast cancer.  There is deep inversion of and left nipple- areolar complex.  There are some subcutaneous areas of firmness at about the 6 and 5 o'clock position related to the nipple.  At about the 2 o'clock position just adjacent to the nipple is a 2 to 3 cm indurated area that is somewhat soft, all of which is consistent with locally advanced breast cancer, that has responded very  well to current treatment.  Skin:  Intact.  LABORATORY DATA:  Today white count 5.4, ANC 3.0, hemoglobin 12.4, hematocrit 36.0, platelets 247,000.  Chemistries and iron studies are pending.  Chemistries from 03/20/2011 were normal except for a glucose of 108.  Ferritin was 59, iron saturation 37%.  On 12/05/2010, the vitamin B12 level was 643, and the vitamin D level was 66.  IMAGING STUDIES: 1. Chest x-ray on 06/27/2010 showed no acute cardiopulmonary process. 2. Digital diagnostic bilateral mammograms on 07/04/2010 showed the     masses in both breasts. 3. Digital diagnostic bilateral mammograms carried out on 07/31/2011     showed a dense  spiculated mass containing central biopsy clip     artifact in the slightly upper outer left breast with associated     nipple and skin retraction without significant change since the     most recent prior study of 07/04/2010.  On mammogram, this mass     measures approximately 2.7 x 2.5 x 2.9 cm.  No new mass is     identified in the left breast. 4. On the right side, the circumscribed more superiorly and     posteriorly positioned mass has decreased in size since 07/04/2010.     On the mammogram, it currently measures 3.2 x 3.2 x 2.8 cm, whereas     previously it measured 4.0 x 3.8 x 3.4 cm.  The more irregular mass     in the upper central right breast just anterior to the     circumscribed mass is without significant change.  It measured     approximately 2.9 cm in greatest dimension and is felt to be     stable.  No new masses identified in the right breast.  IMPRESSION AND PLAN:  [name] continues to do extraordinarily well with an excellent response regarding the known locally advanced cancer in her left breast and also resolution of what had been cystic masses in her right breast.  It will be recalled that the biopsy from the right breast was negative.  The patient is completely asymptomatic without any complaints.  Her CBC appears to be stable.  The patient says she is being compliant with her Femara and her other medicines.  We will plan to see [name] again in 4 months at which time we will check CBC, chemistries, and iron studies.    ______________________________ Samul Dada, M.D. DSM/MEDQ  D:  10/28/2011  T:  10/28/2011  Job:  [id number]

## 2011-10-28 NOTE — Progress Notes (Signed)
This office note has been dictated.  #[id number]

## 2011-10-28 NOTE — Telephone Encounter (Signed)
APPT MADE FOR 5/31 AND PRINTED  AOM

## 2011-10-29 ENCOUNTER — Encounter: Payer: Self-pay | Admitting: Oncology

## 2011-10-29 NOTE — Progress Notes (Signed)
Ferritin from 10/28/11 was 18 down from 59 on 03/20/11.  We will give pt 2 doses of IV Feraheme.

## 2011-10-30 ENCOUNTER — Other Ambulatory Visit: Payer: Self-pay

## 2011-10-30 ENCOUNTER — Telehealth: Payer: Self-pay

## 2011-10-30 NOTE — Telephone Encounter (Signed)
Pt's daughter [name] notified per Dr Arline Asp that schedulers will be contacting her to schedule IV Iron for pt.   Pt has had Iron Dextran in the past, but will be receiving Feraheme this time.  Procedure & time expected explained to [name] who verbalizes understanding.   Onc Tx schedule to schedulers by Dr DM.  dph

## 2011-11-02 ENCOUNTER — Other Ambulatory Visit: Payer: Medicare Other | Admitting: Lab

## 2011-11-02 ENCOUNTER — Ambulatory Visit: Payer: Medicare Other | Admitting: Oncology

## 2011-11-02 ENCOUNTER — Telehealth: Payer: Self-pay | Admitting: Oncology

## 2011-11-02 NOTE — Telephone Encounter (Signed)
l/m with 2/1 and 2/8 iv iron appts,aom

## 2011-11-06 ENCOUNTER — Ambulatory Visit (HOSPITAL_BASED_OUTPATIENT_CLINIC_OR_DEPARTMENT_OTHER): Payer: Medicare Other

## 2011-11-06 VITALS — BP 192/83 | HR 63 | Temp 97.1°F

## 2011-11-06 DIAGNOSIS — D509 Iron deficiency anemia, unspecified: Secondary | ICD-10-CM

## 2011-11-06 MED ORDER — SODIUM CHLORIDE 0.9 % IV SOLN
Freq: Once | INTRAVENOUS | Status: AC
  Administered 2011-11-06: 10:00:00 via INTRAVENOUS

## 2011-11-06 MED ORDER — FERUMOXYTOL INJECTION 510 MG/17 ML
510.0000 mg | Freq: Once | INTRAVENOUS | Status: AC
  Administered 2011-11-06: 510 mg via INTRAVENOUS
  Filled 2011-11-06: qty 17

## 2011-11-12 ENCOUNTER — Other Ambulatory Visit: Payer: Self-pay | Admitting: Oncology

## 2011-11-12 DIAGNOSIS — I10 Essential (primary) hypertension: Secondary | ICD-10-CM

## 2011-11-12 DIAGNOSIS — C50419 Malignant neoplasm of upper-outer quadrant of unspecified female breast: Secondary | ICD-10-CM

## 2011-11-13 ENCOUNTER — Ambulatory Visit (HOSPITAL_BASED_OUTPATIENT_CLINIC_OR_DEPARTMENT_OTHER): Payer: Medicare Other

## 2011-11-13 ENCOUNTER — Other Ambulatory Visit: Payer: Self-pay

## 2011-11-13 ENCOUNTER — Telehealth: Payer: Self-pay

## 2011-11-13 VITALS — BP 183/78 | HR 68 | Temp 98.3°F

## 2011-11-13 DIAGNOSIS — D509 Iron deficiency anemia, unspecified: Secondary | ICD-10-CM

## 2011-11-13 MED ORDER — FERUMOXYTOL INJECTION 510 MG/17 ML
510.0000 mg | Freq: Once | INTRAVENOUS | Status: AC
  Administered 2011-11-13: 510 mg via INTRAVENOUS
  Filled 2011-11-13: qty 17

## 2011-11-13 MED ORDER — SODIUM CHLORIDE 0.9 % IV SOLN
Freq: Once | INTRAVENOUS | Status: AC
  Administered 2011-11-13: 10:00:00 via INTRAVENOUS

## 2011-11-13 NOTE — Patient Instructions (Signed)
Pt aware of f/u appts, no complaints.  SLJ

## 2011-11-13 NOTE — Telephone Encounter (Signed)
[name] called earlier in AM that her mothers BP is elevated to 173 systolic and can she adjust her BP meds. [name] was able to get NP appt for her mother in August.  I called [place] and set up a NP appt w/Dr Oliver Barre March 19 at 3pm. S/W Dr Arline Asp and he said to increase norvasc to 10 mg daily.  Called [name] back and relayed this information. [name] expressed understanding

## 2011-11-13 NOTE — Progress Notes (Signed)
Pt's daughter with pt, stating that she needs her BP meds to be adjusted by Dr Arline Asp.  He had given her some BP rx's because she was not established with a PCP.  Pt's daughter stated they are "having trouble getting her established with a PCP (they keep saying they have no openings)".  Gave daughter information to the [facility] on [address] and she stated she will call Dr Murinson's desk RN to discuss possibly adjusting meds in the meantime. She also stated she may take her to urgent care to get some BP meds.  She denied any headaches today but does have h/a at times when BP is high.  BP today is 183/78.  SLJ

## 2011-12-02 ENCOUNTER — Telehealth: Payer: Self-pay

## 2011-12-02 ENCOUNTER — Other Ambulatory Visit: Payer: Self-pay

## 2011-12-02 DIAGNOSIS — I10 Essential (primary) hypertension: Secondary | ICD-10-CM

## 2011-12-02 MED ORDER — AMLODIPINE BESYLATE 5 MG PO TABS: 10.0000 mg | ORAL_TABLET | Freq: Every day | ORAL | Status: DC

## 2011-12-02 NOTE — Telephone Encounter (Signed)
Daughter called that pt is out of norvasc because Dr Arline Asp increased her dose to 10mg /day. Her appt with cardiologist is not until mid March. She asked for refill until she could get into cardiologist. 1 refill given.

## 2011-12-19 ENCOUNTER — Encounter: Payer: Self-pay | Admitting: Internal Medicine

## 2011-12-19 DIAGNOSIS — I472 Ventricular tachycardia: Secondary | ICD-10-CM | POA: Insufficient documentation

## 2011-12-19 DIAGNOSIS — C50919 Malignant neoplasm of unspecified site of unspecified female breast: Secondary | ICD-10-CM | POA: Insufficient documentation

## 2011-12-19 DIAGNOSIS — I4729 Other ventricular tachycardia: Secondary | ICD-10-CM | POA: Insufficient documentation

## 2011-12-19 DIAGNOSIS — Z0000 Encounter for general adult medical examination without abnormal findings: Secondary | ICD-10-CM | POA: Insufficient documentation

## 2011-12-19 DIAGNOSIS — D649 Anemia, unspecified: Secondary | ICD-10-CM | POA: Insufficient documentation

## 2011-12-19 DIAGNOSIS — K219 Gastro-esophageal reflux disease without esophagitis: Secondary | ICD-10-CM | POA: Insufficient documentation

## 2011-12-19 DIAGNOSIS — I10 Essential (primary) hypertension: Secondary | ICD-10-CM | POA: Insufficient documentation

## 2011-12-19 DIAGNOSIS — K8020 Calculus of gallbladder without cholecystitis without obstruction: Secondary | ICD-10-CM | POA: Insufficient documentation

## 2011-12-19 DIAGNOSIS — K449 Diaphragmatic hernia without obstruction or gangrene: Secondary | ICD-10-CM | POA: Insufficient documentation

## 2011-12-22 ENCOUNTER — Ambulatory Visit (INDEPENDENT_AMBULATORY_CARE_PROVIDER_SITE_OTHER): Payer: Medicare Other | Admitting: Internal Medicine

## 2011-12-22 ENCOUNTER — Encounter: Payer: Self-pay | Admitting: Internal Medicine

## 2011-12-22 VITALS — BP 120/82 | HR 69 | Temp 97.0°F | Ht 62.0 in | Wt 127.0 lb

## 2011-12-22 DIAGNOSIS — Z170 Estrogen receptor positive status [ER+]: Secondary | ICD-10-CM

## 2011-12-22 DIAGNOSIS — Z0000 Encounter for general adult medical examination without abnormal findings: Secondary | ICD-10-CM

## 2011-12-22 DIAGNOSIS — C773 Secondary and unspecified malignant neoplasm of axilla and upper limb lymph nodes: Secondary | ICD-10-CM

## 2011-12-22 DIAGNOSIS — C801 Malignant (primary) neoplasm, unspecified: Secondary | ICD-10-CM

## 2011-12-22 DIAGNOSIS — D509 Iron deficiency anemia, unspecified: Secondary | ICD-10-CM

## 2011-12-22 DIAGNOSIS — D649 Anemia, unspecified: Secondary | ICD-10-CM

## 2011-12-22 DIAGNOSIS — I10 Essential (primary) hypertension: Secondary | ICD-10-CM

## 2011-12-22 DIAGNOSIS — C50419 Malignant neoplasm of upper-outer quadrant of unspecified female breast: Secondary | ICD-10-CM

## 2011-12-22 MED ORDER — LISINOPRIL 20 MG PO TABS: 20.0000 mg | ORAL_TABLET | Freq: Every day | ORAL | Status: DC

## 2011-12-22 MED ORDER — METOPROLOL TARTRATE 25 MG PO TABS: 25.0000 mg | ORAL_TABLET | Freq: Two times a day (BID) | ORAL | Status: DC

## 2011-12-22 MED ORDER — AMLODIPINE BESYLATE 10 MG PO TABS: 10.0000 mg | ORAL_TABLET | Freq: Every day | ORAL | Status: DC

## 2011-12-22 NOTE — Patient Instructions (Signed)
Continue all other medications as before Your refills for BP medications were sent to the pharmacy today (with the amlodipine with the 10 mg pill) Please return in 1 year for your yearly visit, or sooner if needed, with Lab testing done 3-5 days before

## 2011-12-27 ENCOUNTER — Encounter: Payer: Self-pay | Admitting: Internal Medicine

## 2011-12-27 NOTE — Assessment & Plan Note (Signed)

## 2011-12-27 NOTE — Progress Notes (Signed)
Subjective:    Patient ID: [name], female    DOB: March 24, 1922, 78 y.o.   MRN: [id number]  HPI  Here for wellness and to establish as new pt  Overall doing ok;  Pt denies CP, worsening SOB, DOE, wheezing, orthopnea, PND, worsening LE edema, palpitations, dizziness or syncope.  Pt denies neurological change such as new Headache, facial or extremity weakness.  Pt denies polydipsia, polyuria, or low sugar symptoms. Pt states overall good compliance with treatment and medications, good tolerability, and trying to follow lower cholesterol diet.  Pt denies worsening depressive symptoms, suicidal ideation or panic. No fever, wt loss, night sweats, loss of appetite, or other constitutional symptoms.  Pt states good ability with ADL's, low fall risk, home safety reviewed and adequate, no significant changes in hearing or vision, and occasionally active with exercise. Past Medical History  Diagnosis Date  . Anemia, unspecified   . Malignant neoplasm of breast (female), unspecified site   . Anemia, iron deficiency   . Hiatal hernia   . HTN (hypertension)   . Non-sustained ventricular tachycardia   . Cholelithiasis   . Breast cancer, left breast 10/28/2011  . GERD (gastroesophageal reflux disease)    History reviewed. No pertinent past surgical history.  reports that she has never smoked. She does not have any smokeless tobacco history on file. She reports that she does not drink alcohol or use illicit drugs. family history includes Blindness in her maternal grandmother. No Known Allergies Current Outpatient Prescriptions on File Prior to Visit  Medication Sig Dispense Refill  . aspirin 81 MG tablet Take 160 mg by mouth daily.      . Cholecalciferol 5000 UNITS TABS Take 5,000 Units by mouth daily.      . Ferrous Sulfate (IRON) 325 (65 FE) MG TABS Take 325 mg by mouth daily.      Marland Kitchen letrozole (FEMARA) 2.5 MG tablet TAKE 1 TABLET BY MOUTH ONCE A DAY  30 tablet  PRN  . Multiple Vitamin (MULTIVITAMIN)  tablet Take 1 tablet by mouth daily.      Marland Kitchen senna (SENOKOT) 8.6 MG tablet Take 1 tablet by mouth as needed.      . vitamin B-12 (CYANOCOBALAMIN) 100 MCG tablet Take 50 mcg by mouth daily.       Review of Systems Review of Systems  Constitutional: Negative for diaphoresis, activity change, appetite change and unexpected weight change.  HENT: Negative for hearing loss, ear pain, facial swelling, mouth sores and neck stiffness.   Eyes: Negative for pain, redness and visual disturbance.  Respiratory: Negative for shortness of breath and wheezing.   Cardiovascular: Negative for chest pain and palpitations.  Gastrointestinal: Negative for diarrhea, blood in stool, abdominal distention and rectal pain.  Genitourinary: Negative for hematuria, flank pain and decreased urine volume.  Musculoskeletal: Negative for myalgias and joint swelling.  Skin: Negative for color change and wound.  Neurological: Negative for syncope and numbness.  Hematological: Negative for adenopathy.  Psychiatric/Behavioral: Negative for hallucinations, self-injury, decreased concentration and agitation.      Objective:   Physical Exam BP 120/82  Pulse 69  Temp(Src) 97 F (36.1 C) (Oral)  Ht 5\' 2"  (1.575 m)  Wt 127 lb (57.607 kg)  BMI 23.23 kg/m2  SpO2 97% Physical Exam  VS noted Constitutional: Pt appears well-developed and well-nourished.  HENT: Head: Normocephalic.  Right Ear: External ear normal.  Left Ear: External ear normal.  Eyes: Conjunctivae and EOM are normal. Pupils are equal, round, and reactive to light.  Neck: Normal range of motion. Neck supple.  Cardiovascular: Normal rate and regular rhythm.   Pulmonary/Chest: Effort normal and breath sounds normal.  Abd:  Soft, NT, non-distended, + BS Neurological: Pt is alert. No cranial nerve deficit.  Skin: Skin is warm. No erythema.  Psychiatric: Pt behavior is normal. Thought content normal.     Assessment & Plan:

## 2011-12-28 ENCOUNTER — Other Ambulatory Visit: Payer: Self-pay | Admitting: Oncology

## 2012-01-08 ENCOUNTER — Telehealth: Payer: Self-pay | Admitting: Oncology

## 2012-01-08 NOTE — Telephone Encounter (Signed)
called pt back and she did have the u/s and did not have a dvt,her plts were 227,

## 2012-03-04 ENCOUNTER — Ambulatory Visit: Payer: Medicare Other | Admitting: Oncology

## 2012-03-04 ENCOUNTER — Other Ambulatory Visit: Payer: Medicare Other

## 2012-03-18 ENCOUNTER — Telehealth: Payer: Self-pay | Admitting: Oncology

## 2012-03-18 ENCOUNTER — Encounter: Payer: Self-pay | Admitting: Oncology

## 2012-03-18 ENCOUNTER — Other Ambulatory Visit (HOSPITAL_BASED_OUTPATIENT_CLINIC_OR_DEPARTMENT_OTHER): Payer: Medicare Other | Admitting: Lab

## 2012-03-18 ENCOUNTER — Ambulatory Visit (HOSPITAL_BASED_OUTPATIENT_CLINIC_OR_DEPARTMENT_OTHER): Payer: Medicare Other | Admitting: Oncology

## 2012-03-18 VITALS — BP 174/82 | HR 65 | Temp 96.9°F | Ht 62.0 in | Wt 126.1 lb

## 2012-03-18 DIAGNOSIS — D509 Iron deficiency anemia, unspecified: Secondary | ICD-10-CM

## 2012-03-18 DIAGNOSIS — C50912 Malignant neoplasm of unspecified site of left female breast: Secondary | ICD-10-CM

## 2012-03-18 DIAGNOSIS — C50919 Malignant neoplasm of unspecified site of unspecified female breast: Secondary | ICD-10-CM

## 2012-03-18 LAB — CBC WITH DIFFERENTIAL/PLATELET
BASO%: 0.8 % (ref 0.0–2.0)
Eosinophils Absolute: 0.2 10*3/uL (ref 0.0–0.5)
HCT: 39.1 % (ref 34.8–46.6)
LYMPH%: 28.5 % (ref 14.0–49.7)
MCHC: 33.7 g/dL (ref 31.5–36.0)
MONO#: 0.5 10*3/uL (ref 0.1–0.9)
NEUT#: 2.8 10*3/uL (ref 1.5–6.5)
NEUT%: 56.9 % (ref 38.4–76.8)
Platelets: 224 10*3/uL (ref 145–400)
RBC: 3.95 10*6/uL (ref 3.70–5.45)
WBC: 5 10*3/uL (ref 3.9–10.3)
lymph#: 1.4 10*3/uL (ref 0.9–3.3)

## 2012-03-18 LAB — LACTATE DEHYDROGENASE: LDH: 131 U/L (ref 94–250)

## 2012-03-18 LAB — IRON AND TIBC
%SAT: 18 % — ABNORMAL LOW (ref 20–55)
Iron: 55 ug/dL (ref 42–145)

## 2012-03-18 LAB — COMPREHENSIVE METABOLIC PANEL
ALT: 16 U/L (ref 0–35)
CO2: 27 mEq/L (ref 19–32)
Calcium: 9.1 mg/dL (ref 8.4–10.5)
Chloride: 105 mEq/L (ref 96–112)
Glucose, Bld: 98 mg/dL (ref 70–99)
Sodium: 139 mEq/L (ref 135–145)
Total Protein: 6.5 g/dL (ref 6.0–8.3)

## 2012-03-18 LAB — FERRITIN: Ferritin: 276 ng/mL (ref 10–291)

## 2012-03-18 NOTE — Progress Notes (Signed)
This office note has been dictated.  #[id number]

## 2012-03-18 NOTE — Telephone Encounter (Signed)
gv dtr appt schedule for October and referral for cxr to be done same day as lb/fu 07/15/12.

## 2012-03-18 NOTE — Progress Notes (Signed)
CC:   [name]. [name], M.D. [name], MD [name], MD   PROBLEM LIST:  1. Locally advanced cancer of the left breast diagnosed with biopsy  on 01/02/2009 although changes of breast cancer were apparently  evident in November 2009. There was clinical lymph node  involvement. Tumor stage was T4b N1 M0, stage IIIB. Estrogen  receptor was 100%, progesterone receptor 99%. Proliferative index  was 45%. HER-2/neu was negative. The patient declined surgery,  radiation and chemotherapy. She was accepting of Femara which was  started on Feb 02, 2009. The patient continues on this and has had  an excellent response to treatment.  2. Cystic masses involving the right breast with negative biopsy on  01/02/2009.  3. Iron-deficiency anemia requiring hospitalization, 4 units of blood  and intravenous iron. The patient had positive stools but a  negative colonoscopy on 02/28/2009.  Feraheme 510 mg IV was given  on 11/06/2011 and 11/13/2011. 4. Hypertension.  5. Hiatal hernia.  6. Cholelithiasis.  7. History of nonsustained ventricular tachycardia.     MEDICATIONS:  1. Norvasc 10 mg daily.  2. Aspirin 81 mg daily.  3. Cholecalciferol 5000 units daily.  4. Ferrous sulfate 325 mg daily.  5. Femara 2.5 mg daily. This was started on Feb 02, 2009.  6. Lisinopril 20 mg daily.  7. Lopressor 50 mg daily.  8. Multivitamin.  9. Senokot, 1 daily as needed.  10.Vitamin B12, 50 mcg daily.    HISTORY: I saw [name] today for followup of her locally-advanced cancer of the left breast diagnosed with needle biopsy on 01/02/2009.  The patient is here today with her daughter, [name].  She was last seen by [place] on 10/28/2011.  Since that visit, the patient states that she has established care with Dr. Oliver Barre for management of her hypertension. Her condition remains excellent.  She is without any complaints today, feels entirely well.  She states that she is being compliant with  her medicines.  At home her blood pressure is usually normal; however, she appears to have white coat hypertension when she is here.  The patient remains quite active, independent around her home and yard.  She likes to raise barnyard animals as pets.  PHYSICAL EXAMINATION:  The patient is a very spry 78 year old woman who looks quite well for her age.  Weight is 126.1 pounds, height 5 feet 2 inches, body surface area 1.58 sq. m.  Blood pressure 174/82.  Other vital signs are normal.  There is no scleral icterus.  Mouth and pharynx are benign.  There is no peripheral adenopathy palpable in the neck, supraclavicular or axillary areas.  Heart and lungs:  Normal.  Abdomen: Benign with no organomegaly or masses palpable.  Extremities:  1+ edema of both lower legs.  This was not present in January.  Neurologic: Normal.  Examination of the right breast seems to be completely benign today.  I am not able to detect any significant abnormalities.  The nipple-areolar complex is slightly flat.  Left breast continues to show marked distortion in the region of the nipple-areolar complex with some deep creasing and overall firmness measuring about 5 x 4 cm.  There is deep inversion of the left nipple.  There are a couple of very subtle areas of subcutaneous induration located inferiorly to this nipple- areolar complex as previously noted.  No axillary adenopathy.  No obvious lymphedema of either arm.  LABORATORY DATA:  Today, white count 5.0, ANC 2.8, hemoglobin  13.2, hematocrit 39.1, platelets 224,000.  Chemistries and iron studies are pending.  Chemistries from 10/28/2011 were entirely normal.  BUN was 17, creatinine 0.73, albumin 5.1, LDH 131.  Iron studies came back showing iron deficiency with a ferritin of 18, iron saturation 11%, iron 45, and TIBC 416.  Ferritin on 03/20/2011 had been 59.  The patient did receive 2 doses of IV Feraheme 510 mg each on 11/06/2011 and 11/13/2011.  She did not  have any reactions to this treatment.  She remains on oral iron 1 a day.  She has previously undergone a GI workup for positive stools Colonoscopy was negative on 02/28/2009.  IMAGING STUDIES:  1. Chest x-ray on 06/27/2010 showed no acute cardiopulmonary process.  2. Digital diagnostic bilateral mammograms on 07/04/2010 showed the  masses in both breasts.  3. Digital diagnostic bilateral mammograms carried out on 07/31/2011  showed a dense spiculated mass containing central biopsy clip  artifact in the slightly upper outer left breast with associated  nipple and skin retraction without significant change since the  most recent prior study of 07/04/2010. On mammogram, this mass  measures approximately 2.7 x 2.5 x 2.9 cm. No new mass is  identified in the left breast.  On the right side, the circumscribed more superiorly and  posteriorly positioned mass has decreased in size since 07/04/2010.  On the mammogram, it currently measures 3.2 x 3.2 x 2.8 cm, whereas  previously it measured 4.0 x 3.8 x 3.4 cm. The more irregular mass  in the upper central right breast just anterior to the  circumscribed mass is without significant change. It measured  approximately 2.9 cm in greatest dimension and is felt to be  stable. No new masses identified in the right breast.   IMPRESSION AND PLAN:  [name] continues to do extraordinarily well with an excellent response to Femara.  The changes in her right breast seem to have resolved and may have been due to trauma.  Biopsy of that breast was negative, whereas the biopsy of the left breast was positive.  The patient has had an excellent response to Femara and continues to respond extremely well.  We await the results of the iron studies.  The patient will receive supplemental IV Feraheme as indicated.  We will plan to see [name] again in 4 months, at which time we will check CBC, chemistries, iron studies, and a chest x-ray.  The patient will also  be due for bilateral mammograms.    ______________________________ Samul Dada, M.D. DSM/MEDQ  D:  03/18/2012  T:  03/18/2012  Job:  [id number]

## 2012-05-20 ENCOUNTER — Other Ambulatory Visit: Payer: Self-pay | Admitting: Oncology

## 2012-05-26 ENCOUNTER — Other Ambulatory Visit: Payer: Self-pay | Admitting: Oncology

## 2012-05-26 ENCOUNTER — Other Ambulatory Visit: Payer: Self-pay

## 2012-05-26 ENCOUNTER — Telehealth: Payer: Self-pay

## 2012-05-26 MED ORDER — AMLODIPINE BESYLATE 10 MG PO TABS: 10.0000 mg | ORAL_TABLET | Freq: Every day | ORAL | Status: DC

## 2012-05-26 NOTE — Telephone Encounter (Signed)
Fax from pharmacy stating the patient said she has an allergic reaction to Amlodipine 10 mg and would like to go back on the 5 mg as did not bother her.

## 2012-05-26 NOTE — Telephone Encounter (Signed)
Called left message to call back 

## 2012-05-26 NOTE — Telephone Encounter (Signed)
Can we be more specific - can she say what type of side effect or allergy symtpom she may be having?

## 2012-05-27 NOTE — Telephone Encounter (Signed)
Called and spoke to the daughter, the patient had severe itching, breathing problems and cough.  She stopped the med. Last Friday (05/20/12).  She gave the patient a lisinopril this am , her hands started to itch (BP this am 158/78).  Once took the BP med. BP went down to 138/78.. The patient is only taking metoprolol at this time.  The family is taking her off all BP meds. Except the metoprolol.  Please advise as the patient and her family do not want to give her any BP meds. At this time.  Please advise

## 2012-05-27 NOTE — Telephone Encounter (Signed)
Called left message to call back 

## 2012-05-27 NOTE — Telephone Encounter (Signed)
Called the patient left message to call back 

## 2012-05-27 NOTE — Telephone Encounter (Signed)
This is a common situation with the lisinopril  (not the other meds)  Agree with stop the lisinopril only; I added to her allergy list  Needs to continue all other meds  OK for benadryl 50 mg po prn (OTC)  Please call with BP on mon or tues, as we will likely need to consider change to a different med for BP

## 2012-05-30 NOTE — Telephone Encounter (Signed)
Called the patient and spoke to the son in law, informed of all information.  He agreed to inform the daughter who will call back this afternoon with information requested (05/30/12).

## 2012-05-31 NOTE — Telephone Encounter (Signed)
Called the patients daughter left message to call back. 

## 2012-05-31 NOTE — Telephone Encounter (Signed)
Called the patient left message to call back 

## 2012-06-01 NOTE — Telephone Encounter (Signed)
Called and spoke to the son in law.  He stated BP was normal over the weekend.  Informed of all instructions and they will call back if patient has further problems.

## 2012-06-07 ENCOUNTER — Telehealth: Payer: Self-pay | Admitting: *Deleted

## 2012-06-07 NOTE — Telephone Encounter (Signed)
Called robin back no answer left msg on her cell stating pt will need ov before md can change med. MD hasn't seen pt since March...Raechel Chute

## 2012-06-07 NOTE — Telephone Encounter (Signed)
Left msg on triage mom had allergic reaction to med ? If it was BP meds she has stop taking med. Wanting md to rx something else...Raechel Chute

## 2012-06-17 ENCOUNTER — Encounter: Payer: Self-pay | Admitting: Internal Medicine

## 2012-06-17 ENCOUNTER — Ambulatory Visit (INDEPENDENT_AMBULATORY_CARE_PROVIDER_SITE_OTHER): Payer: Medicare Other | Admitting: Internal Medicine

## 2012-06-17 VITALS — BP 144/90 | HR 70 | Temp 97.1°F | Ht 62.0 in | Wt 125.2 lb

## 2012-06-17 DIAGNOSIS — R21 Rash and other nonspecific skin eruption: Secondary | ICD-10-CM

## 2012-06-17 DIAGNOSIS — I10 Essential (primary) hypertension: Secondary | ICD-10-CM

## 2012-06-17 DIAGNOSIS — K219 Gastro-esophageal reflux disease without esophagitis: Secondary | ICD-10-CM

## 2012-06-17 DIAGNOSIS — Z0000 Encounter for general adult medical examination without abnormal findings: Secondary | ICD-10-CM

## 2012-06-17 IMAGING — CR DG CHEST 2V
2 series · 2 of 2 positions shown · non-contrast
Comparison: 08/23/2008

CLINICAL DATA: Breast cancer

CHEST - 2 VIEW

[w chest pa]
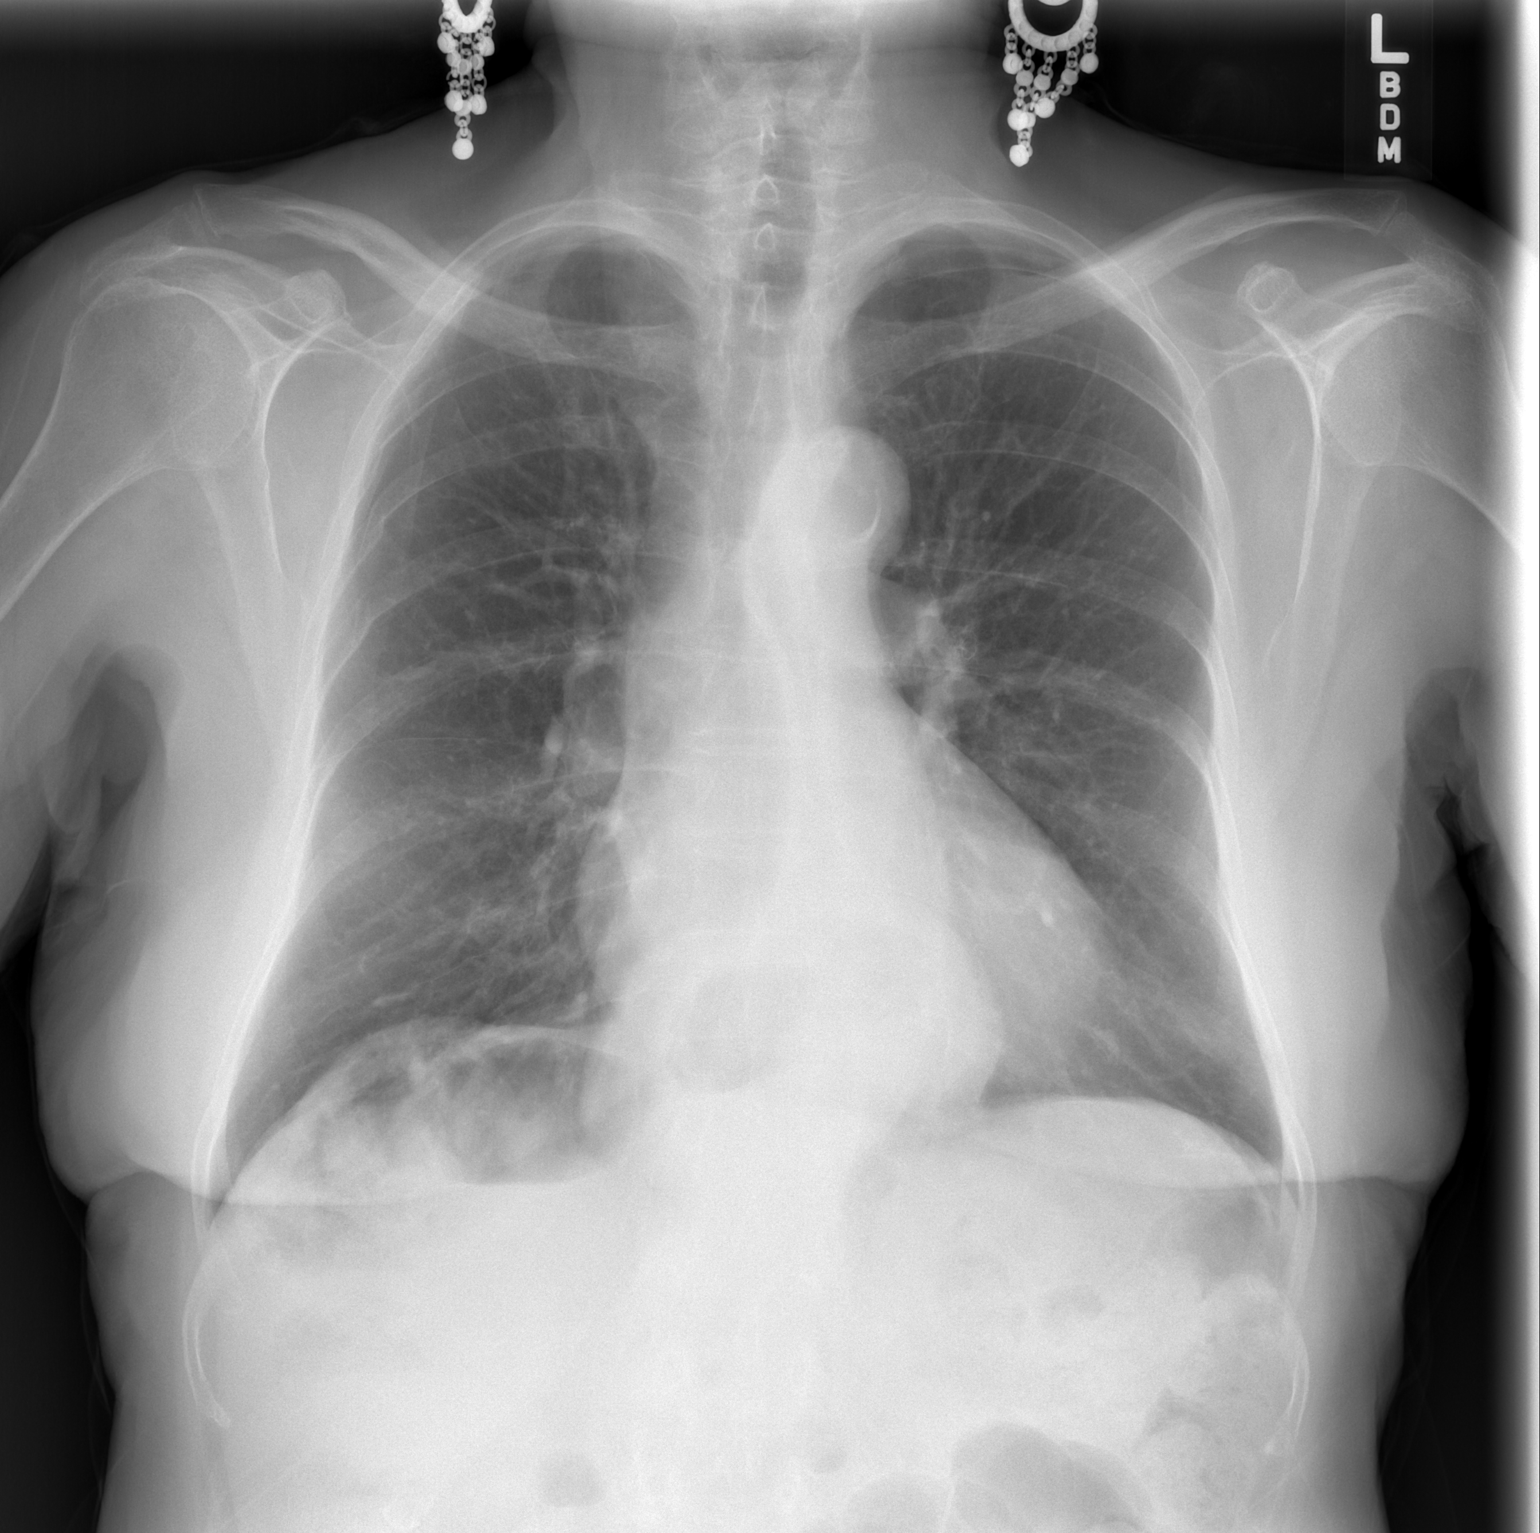

[w chest lat]
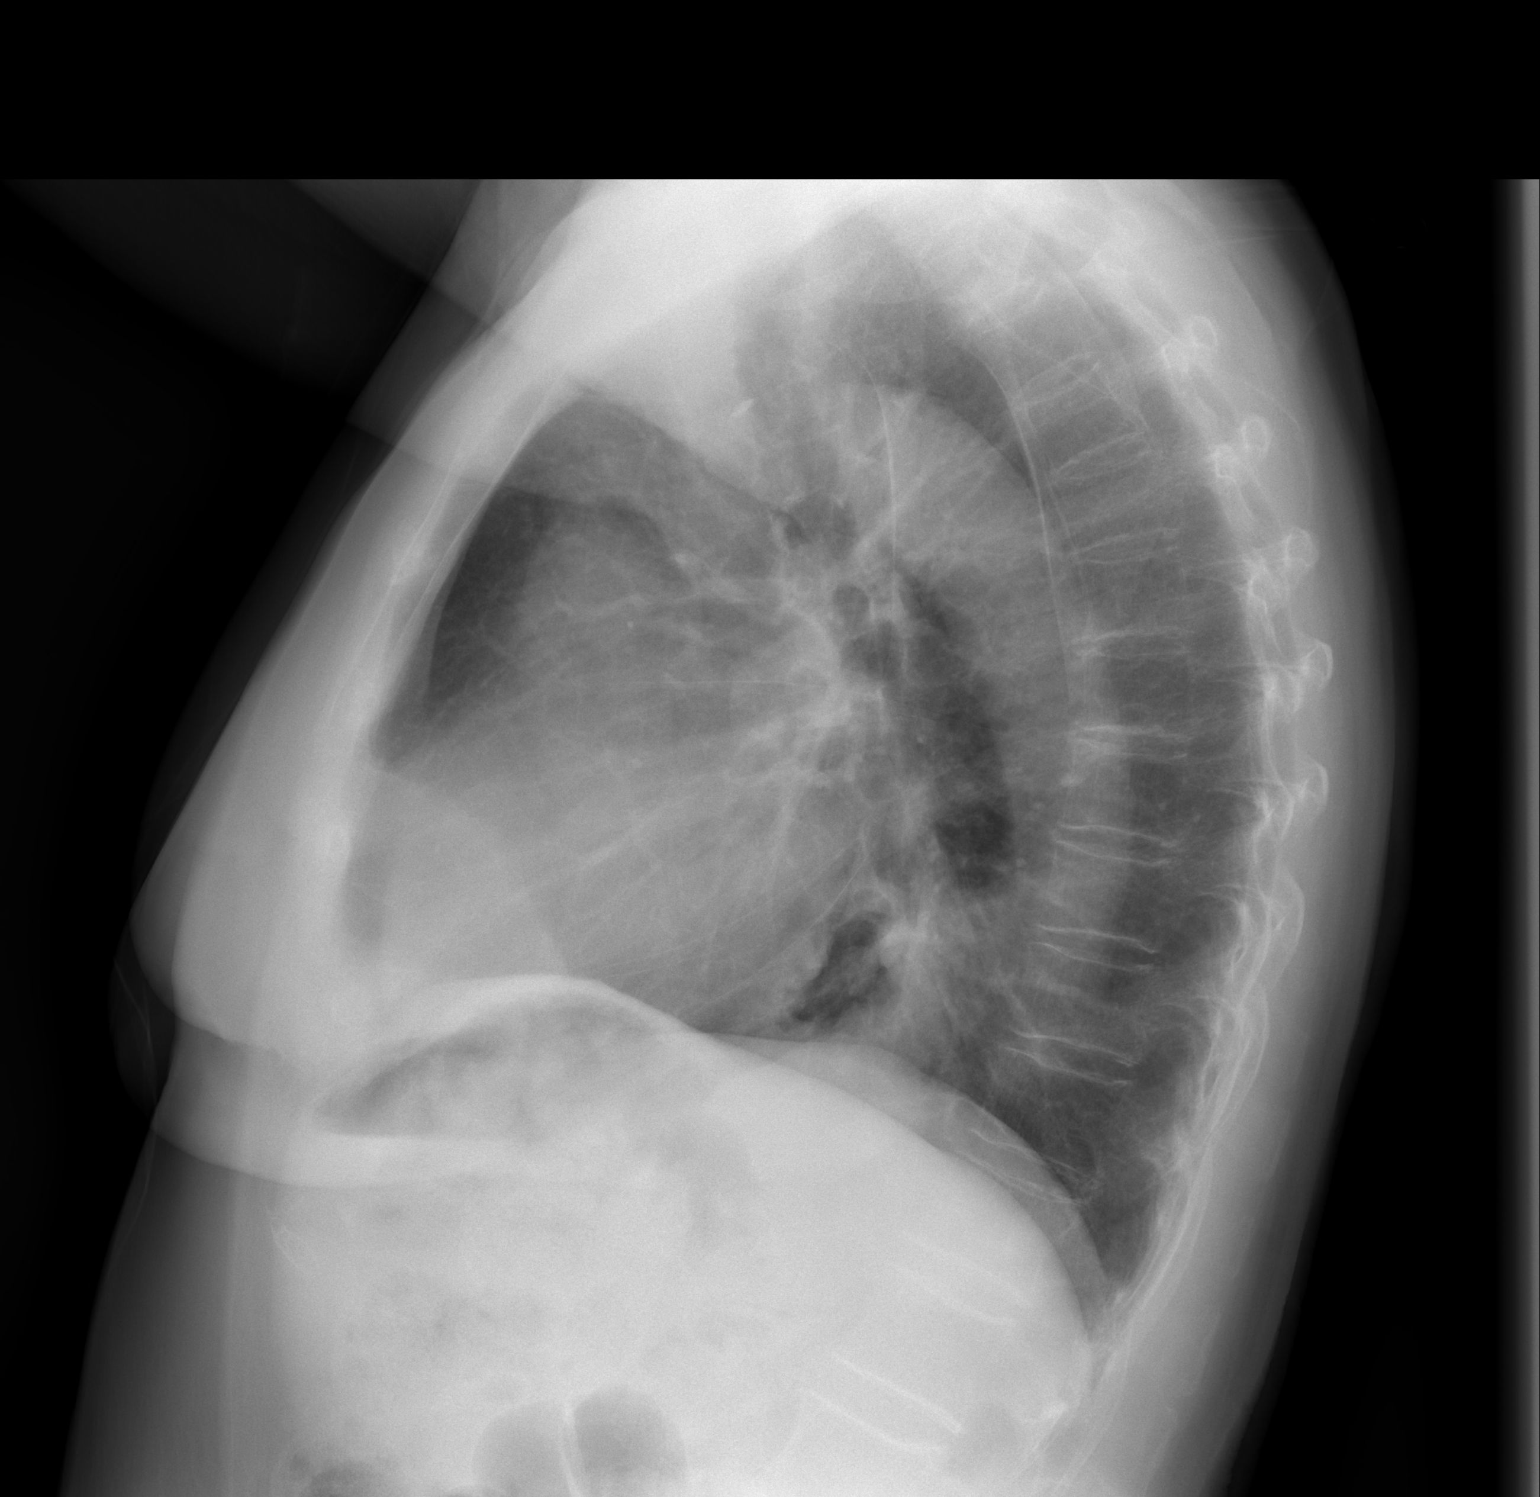

[2 of 2 positions shown; findings below may reference images not displayed]

FINDINGS: Heart size is normal.  Moderate size hiatal hernia noted.
Aorta is ectatic unfolded.  Lungs are grossly clear allowing for
technique.  No pleural effusion.  No acute osseous abnormality.
IMPRESSION: No acute cardiopulmonary process.

## 2012-06-17 MED ORDER — AMLODIPINE-OLMESARTAN 5-20 MG PO TABS: 1.0000 | ORAL_TABLET | Freq: Every day | ORAL | Status: DC

## 2012-06-17 MED ORDER — METHYLPREDNISOLONE ACETATE 80 MG/ML IJ SUSP
120.0000 mg | Freq: Once | INTRAMUSCULAR | Status: AC
  Administered 2012-06-17: 120 mg via INTRAMUSCULAR

## 2012-06-17 MED ORDER — PREDNISONE 10 MG PO TABS: ORAL_TABLET | ORAL | Status: DC

## 2012-06-17 NOTE — Patient Instructions (Addendum)
OK to stop the lisinopril and amlodipine (and Never more take the Lisinopril or ACE Inhibitors in the future) You had the steroid shot today (depomedrol) Take all new medications as prescribed - the prednisone (1 wk only); you can also use caladryl lotion for the worst itchy places Take all new medications as prescribed - the samples of the Azor 5/20 mg - 1 per day for blood pressure Continue all other medications as before Please return if you change your mind about the flu shot Please continue to monitor your Blood Pressure on a regular basis; your goal is to be less than 140/90 Please keep your appointments with your specialists as you have planned Please return in 6 months, or sooner if needed

## 2012-06-17 NOTE — Progress Notes (Signed)
Subjective:    Patient ID: [name], female    DOB: 10/26/1921, 78 y.o.   MRN: [id number]  HPI  Here with daughter after recent rash, likely ACE related and did stop the med, has also stopped the amlodipine well in some confusion,  Did see dermatology as the rash has persisted with skin bx neg for malignancy or other signficant etiology;   Pt denies fever, wt loss, night sweats, loss of appetite, or other constitutional symptoms  Pt denies chest pain, increased sob or doe, wheezing, orthopnea, PND, increased LE swelling, palpitations, dizziness or symptoms, and no tongue or throat swelling.  Pt denies new neurological symptoms such as new headache, or facial or extremity weakness or numbness   Pt denies polydipsia, polyuria.  Denies worsening reflux, dysphagia, abd pain, n/v, bowel change or blood.  Declines flu shot Past Medical History  Diagnosis Date  . Anemia, unspecified   . Malignant neoplasm of breast (female), unspecified site   . Anemia, iron deficiency   . Hiatal hernia   . HTN (hypertension)   . Non-sustained ventricular tachycardia   . Cholelithiasis   . Breast cancer, left breast 10/28/2011  . GERD (gastroesophageal reflux disease)    No past surgical history on file.  reports that she has never smoked. She does not have any smokeless tobacco history on file. She reports that she does not drink alcohol or use illicit drugs. family history includes Blindness in her maternal grandmother. Allergies  Allergen Reactions  . Ace Inhibitors Itching   Current Outpatient Prescriptions on File Prior to Visit  Medication Sig Dispense Refill  . aspirin 81 MG tablet Take 81 mg by mouth daily.       . Cholecalciferol 5000 UNITS TABS Take 5,000 Units by mouth daily.      . Ferrous Sulfate (IRON) 325 (65 FE) MG TABS Take 325 mg by mouth daily.      Marland Kitchen letrozole (FEMARA) 2.5 MG tablet TAKE 1 TABLET BY MOUTH ONCE A DAY  30 tablet  PRN  . metoprolol tartrate (LOPRESSOR) 25 MG tablet Take 1  tablet (25 mg total) by mouth 2 (two) times daily.  180 tablet  3  . Multiple Vitamin (MULTIVITAMIN) tablet Take 1 tablet by mouth daily.      . vitamin B-12 (CYANOCOBALAMIN) 100 MCG tablet Take 50 mcg by mouth daily.      Marland Kitchen amLODipine-olmesartan (AZOR) 5-20 MG per tablet Take 1 tablet by mouth daily.  90 tablet  3  . senna (SENOKOT) 8.6 MG tablet Take 1 tablet by mouth as needed.       Review of Systems  Constitutional: Negative for diaphoresis and unexpected weight change.  HENT: Negative for tinnitus.   Eyes: Negative for photophobia and visual disturbance.  Respiratory: Negative for choking and stridor.   Gastrointestinal: Negative for vomiting and blood in stool.  Genitourinary: Negative for hematuria and decreased urine volume.  Musculoskeletal: Negative for gait problem.  Neurological: Negative for tremors and numbness.  Psychiatric/Behavioral: Negative for decreased concentration. The patient is not hyperactive.      Objective:   Physical Exam BP 144/90  Pulse 70  Temp 97.1 F (36.2 C) (Oral)  Ht 5\' 2"  (1.575 m)  Wt 125 lb 4 oz (56.813 kg)  BMI 22.91 kg/m2  SpO2 98% Physical Exam  VS noted Constitutional: Pt appears well-developed and well-nourished.  HENT: Head: Normocephalic.  Right Ear: External ear normal.  Left Ear: External ear normal.  No tongue or throat swelling Eyes:  Conjunctivae and EOM are normal. Pupils are equal, round, and reactive to light.  Neck: Normal range of motion. Neck supple.  Cardiovascular: Normal rate and regular rhythm.   Pulmonary/Chest: Effort normal and breath sounds normal.  - no rales or wheezing Abd:  Soft, NT, non-distended, + BS Neurological: Pt is alert. Not confused  Skin: Skin with diffuse hive like rash to arms and torso Psychiatric: Pt behavior is normal. Thought content normal.     Assessment & Plan:

## 2012-06-18 ENCOUNTER — Encounter: Payer: Self-pay | Admitting: Internal Medicine

## 2012-06-18 NOTE — Assessment & Plan Note (Signed)
stable overall by hx and exam, most recent data reviewed with pt, and pt to continue medical treatment as before Lab Results  Component Value Date   WBC 5.0 03/18/2012   HGB 13.2 03/18/2012   HCT 39.1 03/18/2012   PLT 224 03/18/2012   GLUCOSE 98 03/18/2012   ALT 16 03/18/2012   AST 18 03/18/2012   NA 139 03/18/2012   K 4.3 03/18/2012   CL 105 03/18/2012   CREATININE 0.78 03/18/2012   BUN 13 03/18/2012   CO2 27 03/18/2012   INR 1.0 08/24/2008

## 2012-06-18 NOTE — Assessment & Plan Note (Addendum)
stable overall by hx and exam, most recent data reviewed with pt, and pt to continue medical treatment as before except to stop the ACE,amlodipine;  And start azor 5/20 mg qd, gave 3 wks samples, f/u BP at home and next visit BP Readings from Last 3 Encounters:  06/17/12 144/90  03/18/12 174/82  12/22/11 120/82

## 2012-06-18 NOTE — Assessment & Plan Note (Signed)
Likely persistent ACEI related rash, to stop the ACE, gave depomedrol IM, and predpack, to watch for polys, confusion or psychosis on prednisone,  to f/u any worsening symptoms or concerns

## 2012-07-14 ENCOUNTER — Telehealth: Payer: Self-pay | Admitting: Oncology

## 2012-07-14 NOTE — Telephone Encounter (Signed)
daughter had called to r/s 10/11 appt to 10/18,done,pt aware   aom

## 2012-07-15 ENCOUNTER — Ambulatory Visit: Payer: Medicare Other | Admitting: Oncology

## 2012-07-15 ENCOUNTER — Ambulatory Visit: Payer: Medicare Other | Admitting: Family

## 2012-07-15 ENCOUNTER — Other Ambulatory Visit: Payer: Medicare Other

## 2012-07-18 ENCOUNTER — Telehealth: Payer: Self-pay | Admitting: Oncology

## 2012-07-18 NOTE — Telephone Encounter (Signed)
l/m with new appt as she left a message to r/s to 11/1    aom

## 2012-07-22 ENCOUNTER — Encounter: Payer: Self-pay | Admitting: Internal Medicine

## 2012-07-22 ENCOUNTER — Telehealth: Payer: Self-pay | Admitting: Internal Medicine

## 2012-07-22 ENCOUNTER — Ambulatory Visit: Payer: Medicare Other | Admitting: Family

## 2012-07-22 ENCOUNTER — Ambulatory Visit (INDEPENDENT_AMBULATORY_CARE_PROVIDER_SITE_OTHER): Payer: Medicare Other | Admitting: Internal Medicine

## 2012-07-22 ENCOUNTER — Other Ambulatory Visit: Payer: Self-pay | Admitting: Oncology

## 2012-07-22 ENCOUNTER — Ambulatory Visit: Payer: Medicare Other | Admitting: Endocrinology

## 2012-07-22 ENCOUNTER — Other Ambulatory Visit: Payer: Medicare Other | Admitting: Lab

## 2012-07-22 VITALS — BP 172/88 | HR 77 | Temp 97.2°F | Resp 16 | Wt 125.0 lb

## 2012-07-22 DIAGNOSIS — I10 Essential (primary) hypertension: Secondary | ICD-10-CM

## 2012-07-22 DIAGNOSIS — Z853 Personal history of malignant neoplasm of breast: Secondary | ICD-10-CM

## 2012-07-22 DIAGNOSIS — R21 Rash and other nonspecific skin eruption: Secondary | ICD-10-CM

## 2012-07-22 DIAGNOSIS — Z9889 Other specified postprocedural states: Secondary | ICD-10-CM

## 2012-07-22 DIAGNOSIS — K219 Gastro-esophageal reflux disease without esophagitis: Secondary | ICD-10-CM

## 2012-07-22 MED ORDER — METOPROLOL TARTRATE 50 MG PO TABS: 50.0000 mg | ORAL_TABLET | Freq: Two times a day (BID) | ORAL | Status: DC

## 2012-07-22 MED ORDER — IRBESARTAN 150 MG PO TABS: 150.0000 mg | ORAL_TABLET | Freq: Every day | ORAL | Status: DC

## 2012-07-22 NOTE — Telephone Encounter (Signed)
° °  Caller: [name]/Child; Patient Name: [name]; PCP: Oliver Barre (Adults only); Best Callback Phone Number: [phone].  She is calling that pt was placed on Azor.   Pt started on Azor 06/17/12 for B/P and now is helping her B/P but is causing nausea every night and she has a slight rash on her arms that started this week. THEY WANT TO KNOW IF HER METROPOLOL 25 MG 1 PO BID CAN BE INCREASED?   IS THERE ANYTHING SHE CAN TAKE THAT IS NOT A CA CHANNEL BLOCKER?  Pt was previously on another B/P med that caused a severe rash.    Rash does not itch and is under the skin.  It looks like brown spots.    Triaged Hypertension and B/P this AM 121/? (low) and all emergent symptoms ruled out.  Needs appt in 72 hours, but per nursing judgement recommending pt be seen today.  Appt with [place] at 1600.  Call back  instrucitons given.

## 2012-07-22 NOTE — Telephone Encounter (Signed)
° °

## 2012-07-22 NOTE — Progress Notes (Signed)
Subjective:    Patient ID: [name], female    DOB: 12-12-1921, 78 y.o.   MRN: [id number]  HPI   Here with daughter to f/u;  Pt believes she is allergic to amlodipine and points to mult liver spots and a few bruises to the arms to prove this;  No other rash, pruritis, swelling, lip swelling, throat swelling, sob/doe, cough.  Both present quite set on change of azor,, though duaghter states several times she was very impressed with the good BP control when taking.  Pt stopped a few days ago - BP last few days similar to today.  Pt denies new neurological symptoms such as new headache, or facial or extremity weakness or numbness   Pt denies polydipsia, polyuria.   Pt denies fever, wt loss, night sweats, loss of appetite, or other constitutional symptoms  No other acute complaints.   Denies urinary symptoms such as dysuria, frequency, urgency,or hematuria.  Denies worsening reflux, dysphagia, abd pain, n/v, bowel change or blood.  Past Medical History  Diagnosis Date  . Anemia, unspecified   . Malignant neoplasm of breast (female), unspecified site   . Anemia, iron deficiency   . Hiatal hernia   . HTN (hypertension)   . Non-sustained ventricular tachycardia   . Cholelithiasis   . Breast cancer, left breast 10/28/2011  . GERD (gastroesophageal reflux disease)    No past surgical history on file.  reports that she has never smoked. She does not have any smokeless tobacco history on file. She reports that she does not drink alcohol or use illicit drugs. family history includes Blindness in her maternal grandmother. Allergies  Allergen Reactions  . Ace Inhibitors Itching  . Amlodipine Nausea Only and Rash    Leg cramps   Current Outpatient Prescriptions on File Prior to Visit  Medication Sig Dispense Refill  . aspirin 81 MG tablet Take 81 mg by mouth daily.       . Cholecalciferol 5000 UNITS TABS Take 5,000 Units by mouth daily.      . Ferrous Sulfate (IRON) 325 (65 FE) MG TABS Take 325 mg  by mouth daily.      Marland Kitchen letrozole (FEMARA) 2.5 MG tablet TAKE 1 TABLET BY MOUTH ONCE A DAY  30 tablet  PRN  . Multiple Vitamin (MULTIVITAMIN) tablet Take 1 tablet by mouth daily.      Marland Kitchen senna (SENOKOT) 8.6 MG tablet Take 1 tablet by mouth as needed.      . vitamin B-12 (CYANOCOBALAMIN) 100 MCG tablet Take 50 mcg by mouth daily.      . irbesartan (AVAPRO) 150 MG tablet Take 1 tablet (150 mg total) by mouth daily.  90 tablet  3  . predniSONE (DELTASONE) 10 MG tablet 2 tabs by mouth per day for 7 days  14 tablet  0   Review of Systems  Constitutional: Negative for diaphoresis and unexpected weight change.  HENT: Negative for tinnitus.   Eyes: Negative for photophobia and visual disturbance.  Respiratory: Negative for choking and stridor.   Gastrointestinal: Negative for vomiting and blood in stool.  Genitourinary: Negative for hematuria and decreased urine volume.  Musculoskeletal: Negative for gait problem.  Skin: Negative for color change and wound.  Neurological: Negative for tremors and numbness.  Psychiatric/Behavioral: Negative for decreased concentration. The patient is not hyperactive.       Objective:   Physical Exam BP 172/88  Pulse 77  Temp 97.2 F (36.2 C) (Oral)  Resp 16  Wt 125 lb (56.7  kg)  SpO2 96% Physical Exam  VS noted Constitutional: Pt appears well-developed and well-nourished.  HENT: Head: Normocephalic.  Right Ear: External ear normal.  Left Ear: External ear normal.  Eyes: Conjunctivae and EOM are normal. Pupils are equal, round, and reactive to light.  Neck: Normal range of motion. Neck supple.  Cardiovascular: Normal rate and regular rhythm.   Pulmonary/Chest: Effort normal and breath sounds normal.  Neurological: Pt is alert. Not confused  Skin: Skin is warm. No erythema. Several bruises and liver spots to arms to which pt is adamant represents an allergic rash Psychiatric: Pt behavior is normal. Thought content normal.     Assessment & Plan:

## 2012-07-22 NOTE — Patient Instructions (Addendum)
OK to stop the azor Please increase the metoprolol to 50 mg twice per day Please start the generic for Avapro at 150 mg per day Continue all other medications as before Please have the pharmacy call with any other refills you may need. Please return in 6 months, or sooner if needed Please remember to sign up for My Chart at your earliest convenience, as this will be important to you in the future with finding out test results.

## 2012-07-23 ENCOUNTER — Encounter: Payer: Self-pay | Admitting: Internal Medicine

## 2012-07-23 NOTE — Assessment & Plan Note (Signed)
stable overall by hx and exam, most recent data reviewed with pt, and pt to continue medical treatment as before Lab Results  Component Value Date   WBC 5.0 03/18/2012   HGB 13.2 03/18/2012   HCT 39.1 03/18/2012   PLT 224 03/18/2012   GLUCOSE 98 03/18/2012   ALT 16 03/18/2012   AST 18 03/18/2012   NA 139 03/18/2012   K 4.3 03/18/2012   CL 105 03/18/2012   CREATININE 0.78 03/18/2012   BUN 13 03/18/2012   CO2 27 03/18/2012   INR 1.0 08/24/2008   No current meds needed, for otc tums prn

## 2012-07-23 NOTE — Assessment & Plan Note (Signed)
Tried to explain to daughter and pt that I dont see evidence for allergy or rash today but they are adamant. I did not mention any specific tx except OTC antihistamine prn

## 2012-07-23 NOTE — Assessment & Plan Note (Addendum)
Ok to try change azor to avapro 150 qd, to cont to follow closely at home as they do, suspect may need higher strength but will start low to avoid overcorrection and symptoms such as dizziness for now, also for increased metoprolol to 50 bid  BP Readings from Last 3 Encounters:  07/22/12 172/88  06/17/12 144/90  03/18/12 174/82

## 2012-08-03 ENCOUNTER — Other Ambulatory Visit: Payer: Self-pay | Admitting: Oncology

## 2012-08-03 ENCOUNTER — Telehealth: Payer: Self-pay | Admitting: Oncology

## 2012-08-03 DIAGNOSIS — C50912 Malignant neoplasm of unspecified site of left female breast: Secondary | ICD-10-CM

## 2012-08-03 NOTE — Telephone Encounter (Signed)
lmonvm for both pt and dtr re pt doing a cxr @ 8:30 am 11/1 before her lb/fu appt here. Pt's dtr was also given this info at 03/18/12  Visit. cxr is a walk in appt.

## 2012-08-05 ENCOUNTER — Telehealth: Payer: Self-pay | Admitting: Oncology

## 2012-08-05 ENCOUNTER — Telehealth: Payer: Self-pay | Admitting: *Deleted

## 2012-08-05 ENCOUNTER — Ambulatory Visit (HOSPITAL_COMMUNITY)
Admission: RE | Admit: 2012-08-05 | Discharge: 2012-08-05 | Disposition: A | Discharge status: Home / Self Care | Payer: Medicare Other | Source: Ambulatory Visit | Attending: Oncology | Admitting: Oncology

## 2012-08-05 ENCOUNTER — Other Ambulatory Visit: Payer: Self-pay | Admitting: Oncology

## 2012-08-05 ENCOUNTER — Other Ambulatory Visit (HOSPITAL_BASED_OUTPATIENT_CLINIC_OR_DEPARTMENT_OTHER): Payer: Medicare Other | Admitting: Lab

## 2012-08-05 ENCOUNTER — Ambulatory Visit (HOSPITAL_BASED_OUTPATIENT_CLINIC_OR_DEPARTMENT_OTHER): Payer: Medicare Other | Admitting: Family

## 2012-08-05 ENCOUNTER — Other Ambulatory Visit: Payer: Self-pay | Admitting: *Deleted

## 2012-08-05 ENCOUNTER — Encounter: Payer: Self-pay | Admitting: Oncology

## 2012-08-05 ENCOUNTER — Encounter: Payer: Self-pay | Admitting: Family

## 2012-08-05 VITALS — BP 183/83 | HR 68 | Temp 97.0°F | Resp 20

## 2012-08-05 DIAGNOSIS — D509 Iron deficiency anemia, unspecified: Secondary | ICD-10-CM

## 2012-08-05 DIAGNOSIS — C50912 Malignant neoplasm of unspecified site of left female breast: Secondary | ICD-10-CM

## 2012-08-05 DIAGNOSIS — C50419 Malignant neoplasm of upper-outer quadrant of unspecified female breast: Secondary | ICD-10-CM

## 2012-08-05 DIAGNOSIS — K449 Diaphragmatic hernia without obstruction or gangrene: Secondary | ICD-10-CM | POA: Insufficient documentation

## 2012-08-05 DIAGNOSIS — C773 Secondary and unspecified malignant neoplasm of axilla and upper limb lymph nodes: Secondary | ICD-10-CM

## 2012-08-05 DIAGNOSIS — C50919 Malignant neoplasm of unspecified site of unspecified female breast: Secondary | ICD-10-CM | POA: Insufficient documentation

## 2012-08-05 LAB — COMPREHENSIVE METABOLIC PANEL (CC13)
ALT: 13 U/L (ref 0–55)
AST: 15 U/L (ref 5–34)
Albumin: 3.5 g/dL (ref 3.5–5.0)
Alkaline Phosphatase: 93 U/L (ref 40–150)
BUN: 11 mg/dL (ref 7.0–26.0)
Potassium: 4.6 mEq/L (ref 3.5–5.1)
Sodium: 133 mEq/L — ABNORMAL LOW (ref 136–145)

## 2012-08-05 LAB — CBC WITH DIFFERENTIAL/PLATELET
BASO%: 0.9 % (ref 0.0–2.0)
EOS%: 1.4 % (ref 0.0–7.0)
MCH: 32.4 pg (ref 25.1–34.0)
MCHC: 34.1 g/dL (ref 31.5–36.0)
MONO#: 0.5 10*3/uL (ref 0.1–0.9)
RBC: 3.84 10*6/uL (ref 3.70–5.45)
lymph#: 1.1 10*3/uL (ref 0.9–3.3)

## 2012-08-05 NOTE — Telephone Encounter (Signed)
appts made and printed for pt aom °

## 2012-08-05 NOTE — Telephone Encounter (Signed)
Called pt at home and spoke with daughter  [name].   Informed [name] that pt's Ferritin level was 34.  Dr. Arline Asp would like for pt to receive Feraheme infusions on  11/7 and  11/14.   [name] requested appts to be changed to Fridays since she is off and can take pt to appts.   Dr. Arline Asp notified.   OK to change dates to  11/8  And  11/15.   New onc tx sent to scheduler.

## 2012-08-05 NOTE — Progress Notes (Signed)
Patient ID: [name], female   DOB: 1922/09/07, 78 y.o.   MRN: [id number] CSN: [id number]  CC: [name]. [name], M.D.  [name], MD  [name], MD    Problem List: [name] is a 78 y.o. Caucasian female with a problem list consisting of:  1. Locally advanced cancer of the left breast diagnosed with biopsy on 01/02/2009 although changes of breast cancer were apparently  evident in November 2009. There was clinical lymph node involvement. Tumor stage was T4b N1 M0, stage IIIB. Estrogen receptor was 100%, progesterone receptor 99%. Proliferative index was 45%. HER-2/neu was negative. The patient declined surgery, radiation and chemotherapy. She was accepting of Femara which was started on Feb 02, 2009.The patient continues on this and has had an excellent response to treatment.  2. Cystic masses involving the right breast with negative biopsy on 01/02/2009.  3. Iron-deficiency anemia requiring hospitalization, 4 units of blood and intravenous iron. The patient had positive stools but a negative colonoscopy on 02/28/2009. Feraheme 510 mg IV was given on 11/06/2011 and 11/13/2011.  4. Hypertension 5. Hiatal hernia 6. Cholelithiasis  7. History of nonsustained ventricular tachycardia.   Dr. Arline Asp and I saw [name] today for follow up of her locally-advanced cancer of the left breast diagnosed with needle biopsy on 01/02/2009. The patient is here today with her daughter, [name]. She was last seen by [place] on 03/18/2012. The patient has established care with Dr. Oliver Barre for management of her hypertension.  Her daughter says her blood pressure is only elevated when she comes here for an office visit.  At home, [name] says her mother's systolic blood pressure is 140 or less.   With regards to her breast cancer and anemia, her condition remains excellent. She is without any complaints today and feels entirely well.  [name] denies any symptomatology today including fever, chills,  night sweats, unusual bleeding, changes with her breasts, N/V/D and constipation.  She states that she is being compliant with her medicines.  [name] states that she did have an allergic reaction to Lisinopril which manifested with a cough and a severe rash.  When the medication was discontinued, her symptoms resolved.  The patient remains quite active, independent around her home and yard. She likes to raise barnyard animals as pets.   Past Medical History: Past Medical History  Diagnosis Date  . Anemia, unspecified   . Malignant neoplasm of breast (female), unspecified site   . Anemia, iron deficiency   . Hiatal hernia   . HTN (hypertension)   . Non-sustained ventricular tachycardia   . Cholelithiasis   . Breast cancer, left breast 10/28/2011  . GERD (gastroesophageal reflux disease)     Surgical History: No past surgical history on file.  Current Medications: Current Outpatient Prescriptions  Medication Sig Dispense Refill  . aspirin 81 MG tablet Take 81 mg by mouth daily.       . Cholecalciferol 5000 UNITS TABS Take 5,000 Units by mouth daily.      . Ferrous Sulfate (IRON) 325 (65 FE) MG TABS Take 325 mg by mouth daily.      . irbesartan (AVAPRO) 150 MG tablet Take 1 tablet (150 mg total) by mouth daily.  90 tablet  3  . letrozole (FEMARA) 2.5 MG tablet TAKE 1 TABLET BY MOUTH ONCE A DAY  30 tablet  PRN  . metoprolol (LOPRESSOR) 50 MG tablet Take 1 tablet (50 mg total) by mouth 2 (two) times daily.  180 tablet  3  . Multiple Vitamin (MULTIVITAMIN) tablet Take 1 tablet by mouth daily.      Marland Kitchen senna (SENOKOT) 8.6 MG tablet Take 1 tablet by mouth as needed.      . vitamin B-12 (CYANOCOBALAMIN) 100 MCG tablet Take 50 mcg by mouth daily.        Allergies: Allergies  Allergen Reactions  . Ace Inhibitors Itching  . Amlodipine Nausea Only and Rash    Leg cramps  . Lisinopril Rash    Family History: Family History  Problem Relation Age of Onset  . Blindness Maternal Grandmother       Social History: History  Substance Use Topics  . Smoking status: Never Smoker   . Smokeless tobacco: Never Used  . Alcohol Use: No    Review of Systems: 10 Point review of systems was completed and is negative except as noted above.   Physical Exam:   Blood pressure 183/83, pulse 68, temperature 97 F (36.1 C), temperature source Oral, resp. rate 20.  General appearance: Alert, cooperative, well nourished, no apparent distress Head: Normocephalic, without obvious abnormality, atraumatic Eyes: Conjunctivae/corneas clear, PERRLA, EOMI Nose: Nares, septum and mucosa are normal, no drainage or sinus tenderness Neck: No adenopathy, supple, symmetrical, trachea midline, thyroid not enlarged, no tenderness Resp: Clear to auscultation bilaterally, diminished bibasilar breath sounds Breasts:  The right breast has a flat nipple-areolar complex but otherwise benign,  Left breast continues to show marked distortion in the region of the nipple-areolar complex with some deep creasing and overall firmness. There is deep inversion with scabbing of the left nipple. No tenderness or discharge of either breast noted. Cardio: Regular rate and rhythm, S1, S2 normal, no murmur, click, rub or gallop GI: Soft, non-tender, hypoactive bowel sounds, no organomegaly, hiatal hernia Extremities: Extremities normal, atraumatic, no cyanosis or edema, dry skin Lymph nodes: Cervical, supraclavicular, and axillary nodes normal Neurologic: Grossly normal   Laboratory Data: Results for orders placed in visit on 08/05/12 (from the past 48 hour(s))  CBC WITH DIFFERENTIAL     Status: Normal   Collection Time   08/05/12  9:25 AM      Component Value Range Comment   WBC 5.2  3.9 - 10.3 10e3/uL    NEUT# 3.5  1.5 - 6.5 10e3/uL    HGB 12.4  11.6 - 15.9 g/dL    HCT 16.1  09.6 - 04.5 %    Platelets 247  145 - 400 10e3/uL    MCV 95.1  79.5 - 101.0 fL    MCH 32.4  25.1 - 34.0 pg    MCHC 34.1  31.5 - 36.0 g/dL    RBC  4.09  8.11 - 9.14 10e6/uL    RDW 13.7  11.2 - 14.5 %    lymph# 1.1  0.9 - 3.3 10e3/uL    MONO# 0.5  0.1 - 0.9 10e3/uL    Eosinophils Absolute 0.1  0.0 - 0.5 10e3/uL    Basophils Absolute 0.0  0.0 - 0.1 10e3/uL    NEUT% 66.4  38.4 - 76.8 %    LYMPH% 21.2  14.0 - 49.7 %    MONO% 10.1  0.0 - 14.0 %    EOS% 1.4  0.0 - 7.0 %    BASO% 0.9  0.0 - 2.0 %   COMPREHENSIVE METABOLIC PANEL (CC13)     Status: Abnormal   Collection Time   08/05/12  9:25 AM      Component Value Range Comment   Sodium  133 (*) 136 - 145 mEq/L    Potassium 4.6  3.5 - 5.1 mEq/L    Chloride 103  98 - 107 mEq/L    CO2 27  22 - 29 mEq/L    Glucose 106 (*) 70 - 99 mg/dl    BUN 09.8  7.0 - 11.9 mg/dL    Creatinine 0.7  0.6 - 1.1 mg/dL    Total Bilirubin <1.47 Repeated and Verified  0.20 - 1.20 mg/dL    Alkaline Phosphatase 93  40 - 150 U/L    AST 15  5 - 34 U/L    ALT 13  0 - 55 U/L    Total Protein 6.0 (*) 6.4 - 8.3 g/dL    Albumin 3.5  3.5 - 5.0 g/dL    Calcium 8.9  8.4 - 82.9 mg/dL   LACTATE DEHYDROGENASE (CC13)     Status: Normal   Collection Time   08/05/12  9:25 AM      Component Value Range Comment   LDH 151  125 - 220 U/L      Imaging Studies: 1. Chest x-ray on 06/27/2010 showed no acute cardiopulmonary process.  2. Digital diagnostic bilateral mammograms on 07/04/2010 showed the masses in both breasts.  3. Digital diagnostic bilateral mammograms carried out on 07/31/2011 showed a dense spiculated mass containing central biopsy clip artifact in the slightly upper outer left breast with associated nipple and skin retraction without significant change since the most recent prior study of 07/04/2010. On mammogram, this mass measures approximately 2.7 x 2.5 x 2.9 cm. No new mass is identified in the left breast. On the right side, the circumscribed more superiorly and posteriorly positioned mass has decreased in size since 07/04/2010. On the mammogram, it currently measures 3.2 x 3.2 x 2.8 cm, whereas previously it  measured 4.0 x 3.8 x 3.4 cm. The more irregular mass in the upper central right breast just anterior to the circumscribed mass is without significant change. It measured approximately 2.9 cm in greatest dimension and is felt to be stable. No new masses identified in the right breast.  4. Chest x-ray 2 views on 08/05/12 shows the chest demonstrates a large air-fluid level in the retrocardiac space.  Findings are suggestive for a large hiatal hernia.  The lungs are clear bilaterally.  Stable appearance of the heart and mediastinum.  The trachea is midline.  No acute cardiopulmonary disease.      Impression/Plan: Ms. Canales continues to do extraordinarily well with an excellent response to Femara.  We await the results of the iron studies. The patient will receive supplemental IV Feraheme if indicated. We will plan to see Ms. Dehaan again in 4 months, at which time we will check CBC, chemistries and iron studies.  The patient is scheduled for bilateral mammograms on 08/12/2012.  The patient and her daughter are asked to contact us in the interim if they have any questions or concerns.   Larina Bras, NP-C 08/05/2012, 9:41 AM

## 2012-08-05 NOTE — Patient Instructions (Addendum)
Please call us if you have questions or concerns 

## 2012-08-05 NOTE — Progress Notes (Signed)
This patient's ferritin was 34 on 08/05/2012, down from 276 on 03/18/2012. The patient had received IV Feraheme 510 mg on February 1 and 11/23/2011 after ferritin value came back 18 on 10/28/2011.  We will plan to give the patient IV Feraheme on November 7 and November 14.  The patient had undergone a colonoscopy on 02/28/2009. That was negative. She has a large hiatal hernia which may be accounting for her chronic GI blood loss. She has had positive stools in the past.

## 2012-08-06 LAB — VITAMIN D 25 HYDROXY (VIT D DEFICIENCY, FRACTURES): Vit D, 25-Hydroxy: 52 ng/mL (ref 30–89)

## 2012-08-06 LAB — IRON AND TIBC
%SAT: 8 % — ABNORMAL LOW (ref 20–55)
Iron: 30 ug/dL — ABNORMAL LOW (ref 42–145)

## 2012-08-06 LAB — FERRITIN: Ferritin: 34 ng/mL (ref 10–291)

## 2012-08-08 ENCOUNTER — Telehealth: Payer: Self-pay | Admitting: *Deleted

## 2012-08-08 NOTE — Telephone Encounter (Signed)
Left voice message on the daughter [name]'s phone about the date and time of the IV IRON appointments  Sent michelle email to set up the patient's IV IRON Feraheme

## 2012-08-11 ENCOUNTER — Telehealth: Payer: Self-pay | Admitting: Oncology

## 2012-08-11 ENCOUNTER — Telehealth: Payer: Self-pay

## 2012-08-11 NOTE — Telephone Encounter (Signed)
lvm at home and on cell that there is no need for lab 11/8 so pt needs just to keep her 245 pm iron infusion appt.

## 2012-08-11 NOTE — Telephone Encounter (Signed)
Infusion appts added for 11/8 and 11/15. appt time for 11/8 changed due to addition of infusion. lmonvm for pt as well as on dtr cell re add on and change in time for 11/8 @ 215pm.

## 2012-08-12 ENCOUNTER — Other Ambulatory Visit: Payer: Self-pay | Admitting: Oncology

## 2012-08-12 ENCOUNTER — Other Ambulatory Visit: Payer: Medicare Other | Admitting: Lab

## 2012-08-12 ENCOUNTER — Encounter: Payer: Self-pay | Admitting: Oncology

## 2012-08-12 ENCOUNTER — Telehealth: Payer: Self-pay

## 2012-08-12 ENCOUNTER — Ambulatory Visit (HOSPITAL_BASED_OUTPATIENT_CLINIC_OR_DEPARTMENT_OTHER): Payer: Medicare Other

## 2012-08-12 ENCOUNTER — Ambulatory Visit
Admission: RE | Admit: 2012-08-12 | Discharge: 2012-08-12 | Disposition: A | Discharge status: Home / Self Care | Payer: Medicare Other | Source: Ambulatory Visit | Attending: Oncology | Admitting: Oncology

## 2012-08-12 DIAGNOSIS — D509 Iron deficiency anemia, unspecified: Secondary | ICD-10-CM

## 2012-08-12 DIAGNOSIS — Z853 Personal history of malignant neoplasm of breast: Secondary | ICD-10-CM

## 2012-08-12 DIAGNOSIS — Z9889 Other specified postprocedural states: Secondary | ICD-10-CM

## 2012-08-12 DIAGNOSIS — C50912 Malignant neoplasm of unspecified site of left female breast: Secondary | ICD-10-CM

## 2012-08-12 MED ORDER — SODIUM CHLORIDE 0.9 % IV SOLN
Freq: Once | INTRAVENOUS | Status: AC
  Administered 2012-08-12: 15:00:00 via INTRAVENOUS

## 2012-08-12 MED ORDER — FERUMOXYTOL INJECTION 510 MG/17 ML
510.0000 mg | Freq: Once | INTRAVENOUS | Status: AC
  Administered 2012-08-12: 510 mg via INTRAVENOUS
  Filled 2012-08-12: qty 17

## 2012-08-12 NOTE — Telephone Encounter (Signed)
lvm that on 11/15 pt has 1030 lab appt, MD appt and infusion appt for iron. Dr Arline Asp wants to see pt to discuss recent mammogram and [place] results.

## 2012-08-12 NOTE — Patient Instructions (Addendum)
Ferumoxytol injection What is this medicine? FERUMOXYTOL is an iron complex. Iron is used to make healthy red blood cells, which carry oxygen and nutrients throughout the body. This medicine is used to treat iron deficiency anemia in people with chronic kidney disease. This medicine may be used for other purposes; ask your health care provider or pharmacist if you have questions. What should I tell my health care provider before I take this medicine? They need to know if you have any of these conditions: -anemia not caused by low iron levels -high levels of iron in the blood -magnetic resonance imaging (MRI) test scheduled -an unusual or allergic reaction to iron, other medicines, foods, dyes, or preservatives -pregnant or trying to get pregnant -breast-feeding How should I use this medicine? This medicine is for infusion into a vein. It is given by a health care professional in a hospital or clinic setting. Talk to your pediatrician regarding the use of this medicine in children. Special care may be needed. Overdosage: If you think you've taken too much of this medicine contact a poison control center or emergency room at once. Overdosage: If you think you have taken too much of this medicine contact a poison control center or emergency room at once. NOTE: This medicine is only for you. Do not share this medicine with others. What if I miss a dose? It is important not to miss your dose. Call your doctor or health care professional if you are unable to keep an appointment. What may interact with this medicine? This medicine may interact with the following medications: -other iron products This list may not describe all possible interactions. Give your health care provider a list of all the medicines, herbs, non-prescription drugs, or dietary supplements you use. Also tell them if you smoke, drink alcohol, or use illegal drugs. Some items may interact with your medicine. What should I watch  for while using this medicine? Visit your doctor or healthcare professional regularly. Tell your doctor or healthcare professional if your symptoms do not start to get better or if they get worse. You may need blood work done while you are taking this medicine. You may need to follow a special diet. Talk to your doctor. Foods that contain iron include: whole grains/cereals, dried fruits, beans, or peas, leafy green vegetables, and organ meats (liver, kidney). What side effects may I notice from receiving this medicine? Side effects that you should report to your doctor or health care professional as soon as possible: -allergic reactions like skin rash, itching or hives, swelling of the face, lips, or tongue -breathing problems -changes in blood pressure -feeling faint or lightheaded, falls -fever or chills -flushing, sweating, or hot feelings -swelling of the ankles or feet Side effects that usually do not require medical attention (Report these to your doctor or health care professional if they continue or are bothersome.): -diarrhea -headache -nausea, vomiting -stomach pain This list may not describe all possible side effects. Call your doctor for medical advice about side effects. You may report side effects to FDA at 1-800-FDA-1088. Where should I keep my medicine? This drug is given in a hospital or clinic and will not be stored at home. NOTE: This sheet is a summary. It may not cover all possible information. If you have questions about this medicine, talk to your doctor, pharmacist, or health care provider.  2012, [employer]. (06/13/2008 9:48:25 PM) 

## 2012-08-12 NOTE — Progress Notes (Unsigned)
Mrs. [name] was seen on 08/05/2012 by Norina Buzzard. I did not examined the patient.  Mammogram and ultrasound of the left breast suggests progression. The mass measures 3.3 cm whereas on 07/31/2011 the mass measured 2.6 cm by mammogram. Another mass seen in the lower central aspect of the left breast measures 1.1 cm where as it measured 0.6 cm previously.  By ultrasound the dominant mass in the left breast measures 2.5 x 2.4 x 3.0 cm. The smaller lesion by ultrasound measures 1.1 x 0.7 x 0.9 cm. A lymph node in the left axilla measures 1.1 x 0.9 x 1.4 cm. There was obliteration of the fatty hilum.  Examination of the right breast by ultrasound discloses a mass in the superior aspect of the right breast measuring 2.4 x 2.6 x 0.6 cm. This mass is located 3 cm from the nipple. A second mass located 1 cm from the nipple measures 1.0 x 0.8 x 1.1 cm.  It is felt that the malignancy in the left breast is progressing. The masses in the right breast are decreasing.

## 2012-08-19 ENCOUNTER — Encounter: Payer: Self-pay | Admitting: Oncology

## 2012-08-19 ENCOUNTER — Other Ambulatory Visit (HOSPITAL_BASED_OUTPATIENT_CLINIC_OR_DEPARTMENT_OTHER): Payer: Medicare Other | Admitting: Lab

## 2012-08-19 ENCOUNTER — Telehealth: Payer: Self-pay

## 2012-08-19 ENCOUNTER — Other Ambulatory Visit: Payer: Self-pay

## 2012-08-19 ENCOUNTER — Telehealth: Payer: Self-pay | Admitting: Oncology

## 2012-08-19 ENCOUNTER — Ambulatory Visit (HOSPITAL_BASED_OUTPATIENT_CLINIC_OR_DEPARTMENT_OTHER): Payer: Medicare Other | Admitting: Oncology

## 2012-08-19 ENCOUNTER — Ambulatory Visit (HOSPITAL_BASED_OUTPATIENT_CLINIC_OR_DEPARTMENT_OTHER): Payer: Medicare Other

## 2012-08-19 ENCOUNTER — Other Ambulatory Visit: Payer: Medicare Other | Admitting: Lab

## 2012-08-19 VITALS — BP 182/91

## 2012-08-19 DIAGNOSIS — D509 Iron deficiency anemia, unspecified: Secondary | ICD-10-CM

## 2012-08-19 DIAGNOSIS — C50912 Malignant neoplasm of unspecified site of left female breast: Secondary | ICD-10-CM

## 2012-08-19 DIAGNOSIS — C773 Secondary and unspecified malignant neoplasm of axilla and upper limb lymph nodes: Secondary | ICD-10-CM

## 2012-08-19 DIAGNOSIS — D649 Anemia, unspecified: Secondary | ICD-10-CM

## 2012-08-19 DIAGNOSIS — C50419 Malignant neoplasm of upper-outer quadrant of unspecified female breast: Secondary | ICD-10-CM

## 2012-08-19 LAB — CBC WITH DIFFERENTIAL/PLATELET
Basophils Absolute: 0.1 10*3/uL (ref 0.0–0.1)
Eosinophils Absolute: 0.1 10*3/uL (ref 0.0–0.5)
HGB: 13.3 g/dL (ref 11.6–15.9)
MCV: 95.2 fL (ref 79.5–101.0)
MONO#: 0.6 10*3/uL (ref 0.1–0.9)
MONO%: 9.8 % (ref 0.0–14.0)
NEUT#: 4 10*3/uL (ref 1.5–6.5)
RBC: 4.06 10*6/uL (ref 3.70–5.45)
RDW: 13.7 % (ref 11.2–14.5)
WBC: 6.3 10*3/uL (ref 3.9–10.3)
lymph#: 1.5 10*3/uL (ref 0.9–3.3)
nRBC: 0 % (ref 0–0)

## 2012-08-19 MED ORDER — FERUMOXYTOL INJECTION 510 MG/17 ML
510.0000 mg | Freq: Once | INTRAVENOUS | Status: AC
  Administered 2012-08-19: 510 mg via INTRAVENOUS
  Filled 2012-08-19: qty 17

## 2012-08-19 MED ORDER — SODIUM CHLORIDE 0.9 % IV SOLN
Freq: Once | INTRAVENOUS | Status: AC
  Administered 2012-08-19: 13:00:00 via INTRAVENOUS

## 2012-08-19 NOTE — Progress Notes (Signed)
CC:   Barbara Fee, MD [name], MD [name]. [name], M.D.  PROBLEM LIST:  1. Locally advanced cancer of the left breast diagnosed with biopsy  on 01/02/2009 although changes of breast cancer were apparently  evident in November 2009. There was clinical lymph node  involvement. Tumor stage was T4b N1 M0, stage IIIB. Estrogen  receptor was 100%, progesterone receptor 99%. Proliferative index  was 45%. HER-2/neu was negative. The patient declined surgery,  radiation and chemotherapy. She was accepting of Femara which was  started on Feb 02, 2009. The patient continues on this and has had  an excellent response to treatment.  2. Cystic masses involving the right breast with negative biopsy on  01/02/2009.  3. Iron-deficiency anemia requiring hospitalization, 4 units of blood  and intravenous iron. The patient had positive stools but a  negative colonoscopy on 02/28/2009. Feraheme 510 mg IV was given  on 11/06/2011 and 11/13/2011.  4. Hypertension.  5. Hiatal hernia.  6. Cholelithiasis.  7. History of nonsustained ventricular tachycardia.    MEDICATIONS:  1. Norvasc 10 mg daily.  2. Aspirin 81 mg daily.  3. Cholecalciferol 5000 units daily.  4. Ferrous sulfate 325 mg daily.  5. Femara 2.5 mg daily. This was started on Feb 02, 2009.  6. Avapro 150 mg daily.  7. Lopressor 50 mg twice daily.  8. Multivitamin.  9. Senokot, 1 daily as needed.  10.Vitamin B12, 50 mcg daily.    HISTORY:  [name] was seen today for followup of her locally advanced cancer involving the left breast currently on Femara 2.5 mg daily.  This was started in May 2010.  The patient also is under evaluation for recurrent iron deficiency anemia associated with positive stools but a negative colonoscopy that was carried out on 02/28/2009.  The patient was last seen by [place] on 08/05/2012 and prior to that on 03/18/2012.  There had been no clinical change in her condition.  The patient's iron  saturation and ferritin on 08/05/2012 came back 8 and 34 respectively down from 18 and 276 respectively on 03/18/2012.  We arranged for Ms. [name] to receive IV Feraheme 510 mg a week ago 11/08. She is here today for another dose of IV Feraheme today.  In addition, the patient had undergone yearly breast re-evaluation on 08/12/2012. These studies were compared with the study done from 07/31/2011.  There was suggestion of progression.  A mass in the left breast that measured 3.3 cm on 11/08 had previously measured 2.6 cm by mammogram.  There was another mass seen in the lower central aspect of the left breast measuring 1.1 cm whereas previously on 07/31/2011 it measured 0.6 cm. Ultrasound was carried out on 08/12/2012.  The dominant mass in the left breast measured 2.5 x 2.4 x 3.0 cm by ultrasound.  The smaller lesion by ultrasound measured 1.1 x 0.7 x 0.9 cm.  I do not believe we had an ultrasound last year for comparison.  A lymph node was seen in the left axilla measuring 1.1 x 0.9 x 1.4 cm.  There was obliteration of the fatty hilum.  There were also by ultrasound masses in the right breast. A mass in the superior aspect of the right breast measured 2.4 x 2.6 x 0.6 cm.  This mass was located 3 cm from the nipple.  A second mass which was located 1 cm from the nipple measured 1.0 x 0.8 x 1.1 cm.  It was felt that the known  biopsy proven malignancy in the left breast was progressing.  The masses in the right breast were decreasing.  Clinically the patient remains stable without symptoms.  She denies any obvious blood in the stools.  She is on iron.  PHYSICAL EXAMINATION:  [name] looks well.  She is here today with her daughter, [name].  Weight is 127.3 pounds.  Her initial blood pressure was 206/85.  Repeat blood pressure was 182/91.  Other vital signs are normal.  The major findings have to do with the left breast where there is a thickening that measures about 4 cm with a lot of  distortion and some scab formation.  I do not see any obvious active tumor on the skin itself.  I do not really see any obvious changes compared with prior exams.  As stated, the left breast is markedly distorted.  The right breast is entirely benign as is the right axilla.  In the left anterior axilla however I feel a 1.5 cm lymph node that I have not appreciated recently.  The patient did have palpable left axillary node at the time of diagnosis 3-4 years ago.  The rest of her physical exam really is unremarkable.  Heart and lungs are normal.  No adenopathy in the neck. There is trace edema of the legs.  Abdomen is benign.  LABORATORY:  Today consisting of CBC, CEA and CA27.29 are pending.  IMAGING STUDIES:  1. Chest x-ray on 06/27/2010 showed no acute cardiopulmonary process.  2. Digital diagnostic bilateral mammograms on 07/04/2010 showed the  masses in both breasts.  3. Digital diagnostic bilateral mammograms carried out on 07/31/2011  showed a dense spiculated mass containing central biopsy clip  artifact in the slightly upper outer left breast with associated  nipple and skin retraction without significant change since the  most recent prior study of 07/04/2010. On mammogram, this mass  measures approximately 2.7 x 2.5 x 2.9 cm. No new mass is  identified in the left breast.  On the right side, the circumscribed more superiorly and  posteriorly positioned mass has decreased in size since 07/04/2010.  On the mammogram, it currently measures 3.2 x 3.2 x 2.8 cm, whereas  previously it measured 4.0 x 3.8 x 3.4 cm. The more irregular mass  in the upper central right breast just anterior to the  circumscribed mass is without significant change. It measured  approximately 2.9 cm in greatest dimension and is felt to be  stable. No new masses identified in the right breast. 4. Chest x-ray, 2 view, from 08/05/2012 showed a large hiatal hernia, but no acute cardiopulmonary disease. 5.  Digital diagnostic bilateral mammogram and bilateral breast ultrasound were carried out on 08/12/2012. These studies were compared with the study done from 07/31/2011.  There was suggestion of progression.  A mass in the left breast that measured 3.3 cm on 11/08 had previously measured 2.6 cm by mammogram.  There was another mass seen in the lower central aspect of the left breast measuring 1.1 cm whereas previously on 07/31/2011 it measured 0.6 cm. Ultrasound was carried out on 08/12/2012.  The dominant mass in the left breast measured 2.5 x 2.4 x 3.0 cm by ultrasound.  The smaller lesion by ultrasound measured 1.1 x 0.7 x 0.9 cm.  I do not believe we had an ultrasound last year for comparison.  A lymph node was seen in the left axilla measuring 1.1 x 0.9 x 1.4 cm.  There was obliteration of the fatty hilum.  There were also by ultrasound masses in the right breast. A mass in the superior aspect of the right breast measured 2.4 x 2.6 x 0.6 cm.  This mass was located 3 cm from the nipple.  A second mass which was located 1 cm from the nipple measured 1.0 x 0.8 x 1.1 cm.  It was felt that the known biopsy proven malignancy in the left breast was progressing.  The masses in the right breast were decreasing.  IMPRESSION AND PLAN:  [name] is here today for a second dose of Feraheme 510 mg IV.  She received a similar dose 510 mg last week on 11/08.  My inclination at this time with regard to the imaging studies regarding the left breast is to be conservative.  Femara has done a superb job of controlling the patient's disease.  My plan is to see her again after the holidays, somewhere around 01/09 at which time we will check CBC, chemistries and iron studies.  I am giving the patient stool cards so we can check for occult blood.  Stools have been positive in the past.  At some point I suspect we will repeat the mammogram and ultrasound, probably 3 or 4 months from when they were carried out on  11/08.  If there is continued progression than a change in treatment will be necessary either to change to something like Faslodex or perhaps something like Aromasin or perhaps even tamoxifen.  I should add that the patient had a fingerstick CBC earlier today and had rather persistent oozing and bleeding from that site requiring a change of bandages.  Even after that the fingerstick continued to ooze. We will continue to use elevation and pressure to control the bleeding. The patient is on aspirin which may be complicating the problem.    ______________________________ Samul Dada, M.D. DSM/MEDQ  D:  08/19/2012  T:  08/19/2012  Job:  [id number]

## 2012-08-19 NOTE — Telephone Encounter (Signed)
Labs hemolyzed from draw in infusion room. Pt most of way home and cannot come back until next Friday. POF to schedulers.

## 2012-08-19 NOTE — Telephone Encounter (Signed)
gv and printed appt schedule for pt for Jan 2014. °

## 2012-08-19 NOTE — Progress Notes (Signed)
This office note has been dictated.  #[id number]

## 2012-08-19 NOTE — Patient Instructions (Addendum)
Ferumoxytol injection What is this medicine? FERUMOXYTOL is an iron complex. Iron is used to make healthy red blood cells, which carry oxygen and nutrients throughout the body. This medicine is used to treat iron deficiency anemia in people with chronic kidney disease. This medicine may be used for other purposes; ask your health care provider or pharmacist if you have questions. What should I tell my health care provider before I take this medicine? They need to know if you have any of these conditions: -anemia not caused by low iron levels -high levels of iron in the blood -magnetic resonance imaging (MRI) test scheduled -an unusual or allergic reaction to iron, other medicines, foods, dyes, or preservatives -pregnant or trying to get pregnant -breast-feeding How should I use this medicine? This medicine is for infusion into a vein. It is given by a health care professional in a hospital or clinic setting. Talk to your pediatrician regarding the use of this medicine in children. Special care may be needed. Overdosage: If you think you've taken too much of this medicine contact a poison control center or emergency room at once. Overdosage: If you think you have taken too much of this medicine contact a poison control center or emergency room at once. NOTE: This medicine is only for you. Do not share this medicine with others. What if I miss a dose? It is important not to miss your dose. Call your doctor or health care professional if you are unable to keep an appointment. What may interact with this medicine? This medicine may interact with the following medications: -other iron products This list may not describe all possible interactions. Give your health care provider a list of all the medicines, herbs, non-prescription drugs, or dietary supplements you use. Also tell them if you smoke, drink alcohol, or use illegal drugs. Some items may interact with your medicine. What should I watch  for while using this medicine? Visit your doctor or healthcare professional regularly. Tell your doctor or healthcare professional if your symptoms do not start to get better or if they get worse. You may need blood work done while you are taking this medicine. You may need to follow a special diet. Talk to your doctor. Foods that contain iron include: whole grains/cereals, dried fruits, beans, or peas, leafy green vegetables, and organ meats (liver, kidney). What side effects may I notice from receiving this medicine? Side effects that you should report to your doctor or health care professional as soon as possible: -allergic reactions like skin rash, itching or hives, swelling of the face, lips, or tongue -breathing problems -changes in blood pressure -feeling faint or lightheaded, falls -fever or chills -flushing, sweating, or hot feelings -swelling of the ankles or feet Side effects that usually do not require medical attention (Report these to your doctor or health care professional if they continue or are bothersome.): -diarrhea -headache -nausea, vomiting -stomach pain This list may not describe all possible side effects. Call your doctor for medical advice about side effects. You may report side effects to FDA at 1-800-FDA-1088. Where should I keep my medicine? This drug is given in a hospital or clinic and will not be stored at home. NOTE: This sheet is a summary. It may not cover all possible information. If you have questions about this medicine, talk to your doctor, pharmacist, or health care provider.  2012, [employer]. (06/13/2008 9:48:25 PM) 

## 2012-08-22 ENCOUNTER — Encounter: Payer: Self-pay | Admitting: Oncology

## 2012-08-22 ENCOUNTER — Telehealth: Payer: Self-pay | Admitting: Oncology

## 2012-08-22 ENCOUNTER — Other Ambulatory Visit: Payer: Self-pay | Admitting: Oncology

## 2012-08-22 DIAGNOSIS — C50912 Malignant neoplasm of unspecified site of left female breast: Secondary | ICD-10-CM

## 2012-08-22 NOTE — Progress Notes (Signed)
In view of the recent mammogram and breast ultrasound suggesting progression of tumor in the left breast, we will go ahead and obtain a PET scan. This patient's last PET scan was on 01/04/2009. There has been no evidence for distant metastatic disease.

## 2012-08-22 NOTE — Telephone Encounter (Signed)
lmonvm for pt re appt for 11/22. Other appts for January and March 2014 remain the same.

## 2012-08-22 NOTE — Telephone Encounter (Signed)
[facility] will contact pt re pet scan appt. 1-2 wks per 11/18 pof. No other orders.

## 2012-08-23 ENCOUNTER — Encounter: Payer: Self-pay | Admitting: Oncology

## 2012-08-23 ENCOUNTER — Telehealth: Payer: Self-pay

## 2012-08-23 NOTE — Progress Notes (Signed)
Received patient's application for assistance. She sent part of the bank statement. I need page 3 and 4 of that statement. I will call patient to advise her need all of it.

## 2012-08-23 NOTE — Telephone Encounter (Signed)
lvm that Dr Arline Asp ordered PET scan for pt. Last PET was in 2010.

## 2012-08-26 ENCOUNTER — Other Ambulatory Visit: Payer: Medicare Other | Admitting: Lab

## 2012-08-26 ENCOUNTER — Encounter: Payer: Self-pay | Admitting: Oncology

## 2012-08-26 DIAGNOSIS — C50912 Malignant neoplasm of unspecified site of left female breast: Secondary | ICD-10-CM

## 2012-08-26 NOTE — Progress Notes (Signed)
Processed patient's application for assistance. 1 family and she is approved for a 70% discount 08/26/12-02/23/13. I will send letter/green card, note account and scanned application in document.

## 2012-09-05 ENCOUNTER — Other Ambulatory Visit: Payer: Self-pay | Admitting: Oncology

## 2012-09-05 DIAGNOSIS — C50912 Malignant neoplasm of unspecified site of left female breast: Secondary | ICD-10-CM

## 2012-09-09 ENCOUNTER — Encounter (HOSPITAL_COMMUNITY): Payer: Self-pay

## 2012-09-09 ENCOUNTER — Ambulatory Visit (HOSPITAL_COMMUNITY)
Admission: RE | Admit: 2012-09-09 | Discharge: 2012-09-09 | Disposition: A | Discharge status: Home / Self Care | Payer: Medicare Other | Source: Ambulatory Visit | Attending: Oncology | Admitting: Oncology

## 2012-09-09 DIAGNOSIS — R911 Solitary pulmonary nodule: Secondary | ICD-10-CM | POA: Insufficient documentation

## 2012-09-09 DIAGNOSIS — C50919 Malignant neoplasm of unspecified site of unspecified female breast: Secondary | ICD-10-CM | POA: Insufficient documentation

## 2012-09-09 DIAGNOSIS — N649 Disorder of breast, unspecified: Secondary | ICD-10-CM | POA: Insufficient documentation

## 2012-09-09 DIAGNOSIS — C50912 Malignant neoplasm of unspecified site of left female breast: Secondary | ICD-10-CM

## 2012-09-09 DIAGNOSIS — R918 Other nonspecific abnormal finding of lung field: Secondary | ICD-10-CM

## 2012-09-09 HISTORY — DX: Other nonspecific abnormal finding of lung field: R91.8

## 2012-09-09 LAB — GLUCOSE, CAPILLARY: Glucose-Capillary: 95 mg/dL (ref 70–99)

## 2012-09-09 MED ORDER — FLUDEOXYGLUCOSE F - 18 (FDG) INJECTION
18.0000 | Freq: Once | INTRAVENOUS | Status: AC | PRN
  Administered 2012-09-09: 18 via INTRAVENOUS

## 2012-09-13 ENCOUNTER — Encounter: Payer: Self-pay | Admitting: Oncology

## 2012-09-13 NOTE — Progress Notes (Signed)
PET scan was carried out on 09/09/2012 and compared with the prior PET scan from 01/04/2009. Scans were carefully reviewed.  There are 2 lesions seen in the left breast. The larger lesion is located lateral to the nipple. There is a smaller lesion located at the 6:00 position which I believe is new. There is also a left axillary lymph node. The 2 previous lesions which were seen on the initial PET scan are smaller. It is my concern that after an initial response to Femara, that the patient is now showing signs of progression in the left breast.  Previously there was a dumbbell lesion in the right breast. This lesion was quite large. There is now a small residual focus which corresponds to the upper part of the previous dumbbell lesion. This residual lesion is associated with increased activity raising concerns that this residual lesion may be malignant. It would appear that the original mass was mostly hematoma which has now resolved. There is the possibility that there was some tumor in the right breast as well.  Lastly, on reviewing the original PET scan, it would appear that the patient may have had small pulmonary metastases which have now regressed. Some of the lesions on the present scan are clearly smaller than they were. Some of the original lesions can no longer be seen. It is the lesions that remain and are now smaller which suggest that this patient originally had small metastatic nodules involving the lungs.  It is my plan to continue Femara for the next couple of months and then to repeat the mammogram of both breasts. Given the fact that we think the patient may have had metastatic disease involving the lungs, albeit minimal, my inclination is to continue with hormonal therapy, possibly Faslodex or tamoxifen. If disease progresses in the left breast, the patient may require local treatment, either radiation or possibly mastectomy.

## 2012-10-06 ENCOUNTER — Telehealth: Payer: Self-pay | Admitting: Oncology

## 2012-10-06 NOTE — Telephone Encounter (Signed)
Moved 1/10 appt from DM to [facility] per desk nurse. lmonvm for pt re change w/new time for 2:45pm. Schedule mailed.

## 2012-10-14 ENCOUNTER — Ambulatory Visit: Payer: Medicare Other | Admitting: Family

## 2012-10-14 ENCOUNTER — Telehealth: Payer: Self-pay | Admitting: Oncology

## 2012-10-14 ENCOUNTER — Other Ambulatory Visit: Payer: Medicare Other | Admitting: Lab

## 2012-10-14 NOTE — Telephone Encounter (Signed)
pt called and need to r/s appt to Feb..Marland KitchenMarland KitchenDone

## 2012-11-11 ENCOUNTER — Telehealth: Payer: Self-pay | Admitting: Oncology

## 2012-11-11 ENCOUNTER — Encounter: Payer: Self-pay | Admitting: Physician Assistant

## 2012-11-11 ENCOUNTER — Other Ambulatory Visit (HOSPITAL_BASED_OUTPATIENT_CLINIC_OR_DEPARTMENT_OTHER): Payer: Medicare Other | Admitting: Lab

## 2012-11-11 ENCOUNTER — Ambulatory Visit (HOSPITAL_BASED_OUTPATIENT_CLINIC_OR_DEPARTMENT_OTHER): Payer: Medicare Other | Admitting: Physician Assistant

## 2012-11-11 VITALS — BP 215/77 | HR 58 | Temp 97.7°F | Resp 20 | Ht 62.0 in | Wt 126.3 lb

## 2012-11-11 DIAGNOSIS — C50419 Malignant neoplasm of upper-outer quadrant of unspecified female breast: Secondary | ICD-10-CM

## 2012-11-11 DIAGNOSIS — D509 Iron deficiency anemia, unspecified: Secondary | ICD-10-CM

## 2012-11-11 DIAGNOSIS — C50912 Malignant neoplasm of unspecified site of left female breast: Secondary | ICD-10-CM

## 2012-11-11 DIAGNOSIS — Z170 Estrogen receptor positive status [ER+]: Secondary | ICD-10-CM

## 2012-11-11 LAB — CBC WITH DIFFERENTIAL/PLATELET
BASO%: 0.9 % (ref 0.0–2.0)
Basophils Absolute: 0 10*3/uL (ref 0.0–0.1)
EOS%: 2.1 % (ref 0.0–7.0)
HCT: 39.5 % (ref 34.8–46.6)
HGB: 13.2 g/dL (ref 11.6–15.9)
LYMPH%: 30.3 % (ref 14.0–49.7)
MCH: 32 pg (ref 25.1–34.0)
MCHC: 33.4 g/dL (ref 31.5–36.0)
MCV: 95.9 fL (ref 79.5–101.0)
NEUT%: 57.1 % (ref 38.4–76.8)
Platelets: 213 10*3/uL (ref 145–400)
lymph#: 1.5 10*3/uL (ref 0.9–3.3)

## 2012-11-11 LAB — COMPREHENSIVE METABOLIC PANEL (CC13)
ALT: 13 U/L (ref 0–55)
Albumin: 3.4 g/dL — ABNORMAL LOW (ref 3.5–5.0)
Alkaline Phosphatase: 103 U/L (ref 40–150)
CO2: 28 mEq/L (ref 22–29)
Potassium: 4 mEq/L (ref 3.5–5.1)
Sodium: 139 mEq/L (ref 136–145)
Total Bilirubin: 0.39 mg/dL (ref 0.20–1.20)
Total Protein: 6.3 g/dL — ABNORMAL LOW (ref 6.4–8.3)

## 2012-11-11 LAB — IRON AND TIBC: UIBC: 212 ug/dL (ref 125–400)

## 2012-11-11 LAB — LACTATE DEHYDROGENASE (CC13): LDH: 131 U/L (ref 125–245)

## 2012-11-11 NOTE — Progress Notes (Signed)
[facility]  [phone]   OFFICE PROGRESS NOTE  Oliver Barre, MD [name], MD [name]. [name], M.D.  PATIENT IDENTIFICATION:  1. Locally advanced cancer of the left breast diagnosed with biopsy on 01/02/2009 although changes of breast cancer were apparently evident in November 2009. There was clinical lymph node involvement. Tumor stage was T4b N1 M0, stage IIIB. Estrogen receptor was 100%, progesterone receptor 99%. Proliferative index  was 45%. HER-2/neu was negative. The patient declined surgery, radiation and chemotherapy. She was accepting of Femara which was started on Feb 02, 2009.  With initial  excellent response to treatment. However PET scan was carried out on 09/09/2012 and compared with the prior PET scan from 01/04/2009 showed  2 lesions seen in the left breast. The larger lesion is located lateral to the nipple. There is a smaller lesion located at the 6:00 position,new. There is also a left axillary lymph node. The 2 previous lesions which were seen on the initial PET scan are smaller. There is concern that after an initial response to Femara, that the patient is now showing signs of progression in the left breast. Previously there was a dumbbell lesion in the right breast. This lesion was quite large. There is now a small residual focus which corresponds to the upper part of the previous dumbbell lesion. This residual lesion is associated with increased activity raising concerns that this residual lesion may be malignant. It would appear that the original mass was mostly hematoma which has now resolved. There is the possibility that there was some tumor in the right breast as well.  2. Cystic masses involving the right breast with negative biopsy on 01/02/2009.  3. Iron-deficiency anemia requiring hospitalization, 4 units of blood and intravenous iron. The patient had positive stools but a negative colonoscopy on 02/28/2009. Feraheme 510 mg IV was  given on 11/06/2011 and 11/13/2011 and 08/08/12.  4. Hypertension.  5. Hiatal hernia.  6. Cholelithiasis.  7. History of nonsustained ventricular tachycardia.  8.PET scan, shows that  patient may have had small pulmonary metastases which have now regressed. Some of the lesions on the present scan are clearly smaller than they were. Some of the original lesions can no longer be seen. It is the lesions that remain and are now smaller which suggest that this patient originally had small metastatic nodules involving the lungs  INTERVAL HISTORY:  Jonetta Speak is returning today accompanied by her daughter Melina Schools.the patient denies any major complaints, other than the known left breast lesions palpated during the prior exam. In addition, the patient states that the left breast at the scab site, shows intermittent serosanguineous draining. She denies any fever, chills, night sweats. No respiratory or cardiac complaints. Her weight is stable her appetite is adequate. Her last CEA on 08/19/2012 was 2.8, with a CA 2729 of 1.6. She continues to take Femara as prescribed.she had a PET scan performed on12/03/2012,with the results above mentioned,worrisome for progression of disease. She tries to remain as active as possible.  MEDICATIONS: Current Outpatient Prescriptions  Medication Sig Dispense Refill  . aspirin 81 MG tablet Take 81 mg by mouth daily.       . Cholecalciferol 5000 UNITS TABS Take 5,000 Units by mouth daily.      . Ferrous Sulfate (IRON) 325 (65 FE) MG TABS Take 325 mg by mouth daily.      . irbesartan (AVAPRO) 150 MG tablet Take 1 tablet (150 mg total) by mouth daily.  90  tablet  3  . letrozole (FEMARA) 2.5 MG tablet TAKE 1 TABLET BY MOUTH ONCE A DAY  30 tablet  3  . metoprolol (LOPRESSOR) 50 MG tablet Take 1 tablet (50 mg total) by mouth 2 (two) times daily.  180 tablet  3  . Multiple Vitamin (MULTIVITAMIN) tablet Take 1 tablet by mouth daily.      Marland Kitchen senna (SENOKOT) 8.6 MG tablet Take 1 tablet by  mouth as needed.      . vitamin B-12 (CYANOCOBALAMIN) 100 MCG tablet Take 50 mcg by mouth daily.        ALLERGIES:  is allergic to ace inhibitors; amlodipine; and lisinopril.  REVIEW OF SYSTEMS:  The rest of the 14-point review of system was negative.   There were no vitals filed for this visit. Wt Readings from Last 3 Encounters:  07/22/12 125 lb (56.7 kg)  06/17/12 125 lb 4 oz (56.813 kg)  03/18/12 126 lb 1.6 oz (57.199 kg)      PHYSICAL EXAMINATION:  Ms. Strength looks well. The major findings have to do with the left breast where there is a thickening that measures about 4 cm with a lot of distortion and some scab formation.No obvious active tumor on the skin itself. I do not really see any obvious changes compared with prior exams. As stated, the left breast is markedly distorted. The right breast is entirely benign as is the right axilla. In the left anterior axilla however I feel a 1.5 cm lymph node palpated  on prior exam The patient did have palpable left axillary node at the time of diagnosis 4 years ago. The rest of her physical exam really is unremarkable. Heart and lungs are normal. No adenopathy in the neck. There is trace edema of the legs. Abdomen is benign.     LABORATORY/RADIOLOGY DATA:   Lab 11/11/12 0956  WBC 5.0  HGB 13.2  HCT 39.5  PLT 213  MCV 95.9  MCH 32.0  MCHC 33.4  RDW 14.2  LYMPHSABS 1.5  MONOABS 0.5  EOSABS 0.1  BASOSABS 0.0  BANDABS --    CMP   No results found for this basename: NA:5,K:5,CL:5,CO2:5,GLUCOSE:5,BUN:5,CREATININE:5,GFRCGP,:5,CALCIUM:5,MG:5,AST:5,ALT:5,ALKPHOS:5,BILITOT:5 in the last 168 hours      Component Value Date/Time   BILITOT <0.20 Repeated and Verified 08/05/2012 0925   BILITOT 0.3 03/18/2012 1339     Radiology Studies:  1. Chest x-ray on 06/27/2010 showed no acute cardiopulmonary process.   2. Digital diagnostic bilateral mammograms on 07/04/2010 showed the masses in both breasts.   3. Digital diagnostic  bilateral mammograms carried out on 07/31/2011 showed a dense spiculated mass containing central biopsy clip artifact in the slightly upper outer left breast with associated nipple and skin retraction without significant change since the most recent prior study of 07/04/2010. On mammogram, this mass  measures approximately 2.7 x 2.5 x 2.9 cm. No new mass is identified in the left breast. On the right side, the circumscribed more superiorly and posteriorly positioned mass has decreased in size since 07/04/2010. On the mammogram, it currently measures 3.2 x 3.2 x 2.8 cm, whereas previously it measured 4.0 x 3.8 x 3.4 cm. The more irregular mass in the upper central right breast just anterior to the cicumscribed mass is without significant change. It measured  approximately 2.9 cm in greatest dimension and is felt to be stable. No new masses identified in the right breast.   4. Chest x-ray, 2 view, from 08/05/2012 showed a large hiatal hernia, but no acute cardiopulmonary  disease.   5. Digital diagnostic bilateral mammogram and bilateral breast ultrasound were carried out on 08/12/2012. These studies were compared with the study done from 07/31/2011. There was suggestion of progression. A mass in the left breast that measured 3.3 cm on 11/08 had previously measured 2.6 cm by mammogram. There was another mass seen in the lower central aspect of the left breast measuring 1.1 cm whereas previously on 07/31/2011 it measured 0.6 cm. Ultrasound was carried out on 08/12/2012. The dominant mass in the left breast measured 2.5 x 2.4 x 3.0 cm by ultrasound. The smaller lesion by ultrasound measured 1.1 x 0.7 x 0.9 cm. I do not believe we had an ultrasound last year for comparison. A lymph node was seen in the left axilla measuring 1.1 x 0.9 x 1.4 cm. There was obliteration of the fatty hilum. There were also by ultrasound masses in the right breast. A mass in the superior aspect of the right breast measured 2.4 x 2.6 x    0.6 cm. This mass was located 3 cm from the nipple. A second mass which was located 1 cm from the nipple measured 1.0 x 0.8 x 1.1 cm. It was felt that the known biopsy proven malignancy in the left breast was progressing. The masses in the right breast were decreasing     ASSESSMENT AND PLAN:  Dr. Arline Asp has seen and evaluated the patient, and review all the labs and imaging studies.her latest PET scan is worrisome for progression of disease, despite the use of Femara.we'll repeat a mammogram hopefully today for evaluation of progression, along with chest x-ray to rule out metastatic disease.  If disease progresses in the left breast, the patient may require local treatment, either radiation or possibly mastectomy.at this time, Femara will be discontinued. She will return in 2 weeks, for the review of the films and further discussion of the plans. She knows to call us if she has any questions or concerns

## 2012-11-11 NOTE — Patient Instructions (Signed)
Return in 2 weeks to see Dr. Arline Asp . Will proceed with Chest X Ray and a bilateral mammogram.

## 2012-11-11 NOTE — Telephone Encounter (Signed)
gv and printed appt schedule for pt for Feb and March...desk nurse to call back with d/t of 2week appt...will call pt with appt d/

## 2012-11-11 NOTE — Telephone Encounter (Signed)
called pt and advised on 2.21.14 appts.Marland KitchenMarland Kitchen

## 2012-11-15 ENCOUNTER — Telehealth: Payer: Self-pay

## 2012-11-15 NOTE — Telephone Encounter (Signed)
S/w pt and she asked me to call daughter. lvm for daughter to call back to see if mammogram office gave reason for not doing earlier, booked or insurance or what?

## 2012-11-16 ENCOUNTER — Telehealth: Payer: Self-pay

## 2012-11-16 NOTE — Telephone Encounter (Signed)
S/w robin that Dr Arline Asp said to keep 12/09/12 appt and to cancel 2/21 appt so we could have all the tests done before the visit. [name] expressed understanding

## 2012-11-25 ENCOUNTER — Ambulatory Visit: Payer: Medicare Other | Admitting: Oncology

## 2012-12-02 ENCOUNTER — Ambulatory Visit (HOSPITAL_COMMUNITY)
Admission: RE | Admit: 2012-12-02 | Discharge: 2012-12-02 | Disposition: A | Discharge status: Home / Self Care | Payer: Medicare Other | Source: Ambulatory Visit | Attending: Physician Assistant | Admitting: Physician Assistant

## 2012-12-02 ENCOUNTER — Ambulatory Visit
Admission: RE | Admit: 2012-12-02 | Discharge: 2012-12-02 | Disposition: A | Discharge status: Home / Self Care | Payer: Medicare Other | Source: Ambulatory Visit | Attending: Physician Assistant | Admitting: Physician Assistant

## 2012-12-02 DIAGNOSIS — K449 Diaphragmatic hernia without obstruction or gangrene: Secondary | ICD-10-CM | POA: Insufficient documentation

## 2012-12-02 DIAGNOSIS — C50912 Malignant neoplasm of unspecified site of left female breast: Secondary | ICD-10-CM

## 2012-12-02 DIAGNOSIS — C50919 Malignant neoplasm of unspecified site of unspecified female breast: Secondary | ICD-10-CM | POA: Insufficient documentation

## 2012-12-02 DIAGNOSIS — Z85118 Personal history of other malignant neoplasm of bronchus and lung: Secondary | ICD-10-CM | POA: Insufficient documentation

## 2012-12-09 ENCOUNTER — Ambulatory Visit: Payer: Medicare Other | Admitting: Oncology

## 2012-12-09 ENCOUNTER — Other Ambulatory Visit: Payer: Medicare Other | Admitting: Lab

## 2012-12-12 ENCOUNTER — Telehealth: Payer: Self-pay | Admitting: Medical Oncology

## 2012-12-12 ENCOUNTER — Other Ambulatory Visit: Payer: Self-pay | Admitting: Medical Oncology

## 2012-12-12 NOTE — Telephone Encounter (Signed)
I called [name] to inform her  that Dr. Arline Asp would like to see her mother this week. We are going to schedule her for 12/14/12 1130 am/ 1200 pm. I also let her know that Dr. Arline Asp is referring her mother to radiation oncology for an evaluation. She states she is not sure her mother would want to do radiation and I told her she can discuss with Dr. Arline Asp on Wed.Marland Kitchenand if she chooses not to do we can cancel her appointment. She voiced understanding.

## 2012-12-12 NOTE — Telephone Encounter (Signed)
Pt's daughter [name] called left a message stating that her mother missed her appointment due to the weather. She is worried about her mother. This am she looked at her mother's breast and it is worse. She noted there is some bleeding. I spoke with Dr. Arline Asp and he needs to see the pt this week and he would like pt to be referred to rad-onc. POF was made for these appointments.

## 2012-12-14 ENCOUNTER — Other Ambulatory Visit (HOSPITAL_BASED_OUTPATIENT_CLINIC_OR_DEPARTMENT_OTHER): Payer: Medicare Other | Admitting: Lab

## 2012-12-14 ENCOUNTER — Encounter: Payer: Self-pay | Admitting: Radiation Oncology

## 2012-12-14 ENCOUNTER — Encounter: Payer: Self-pay | Admitting: Oncology

## 2012-12-14 ENCOUNTER — Ambulatory Visit (HOSPITAL_BASED_OUTPATIENT_CLINIC_OR_DEPARTMENT_OTHER): Payer: Medicare Other | Admitting: Oncology

## 2012-12-14 VITALS — BP 220/100 | HR 86 | Temp 97.9°F | Resp 20 | Ht 62.0 in | Wt 124.8 lb

## 2012-12-14 DIAGNOSIS — C50419 Malignant neoplasm of upper-outer quadrant of unspecified female breast: Secondary | ICD-10-CM

## 2012-12-14 DIAGNOSIS — Z79818 Long term (current) use of other agents affecting estrogen receptors and estrogen levels: Secondary | ICD-10-CM

## 2012-12-14 DIAGNOSIS — D539 Nutritional anemia, unspecified: Secondary | ICD-10-CM | POA: Insufficient documentation

## 2012-12-14 DIAGNOSIS — L539 Erythematous condition, unspecified: Secondary | ICD-10-CM

## 2012-12-14 DIAGNOSIS — R58 Hemorrhage, not elsewhere classified: Secondary | ICD-10-CM

## 2012-12-14 DIAGNOSIS — C50912 Malignant neoplasm of unspecified site of left female breast: Secondary | ICD-10-CM

## 2012-12-14 DIAGNOSIS — Z5111 Encounter for antineoplastic chemotherapy: Secondary | ICD-10-CM

## 2012-12-14 HISTORY — DX: Erythematous condition, unspecified: L53.9

## 2012-12-14 HISTORY — DX: Long term (current) use of other agents affecting estrogen receptors and estrogen levels: Z79.818

## 2012-12-14 HISTORY — DX: Hemorrhage, not elsewhere classified: R58

## 2012-12-14 LAB — COMPREHENSIVE METABOLIC PANEL (CC13)
Albumin: 3.3 g/dL — ABNORMAL LOW (ref 3.5–5.0)
Alkaline Phosphatase: 90 U/L (ref 40–150)
CO2: 26 mEq/L (ref 22–29)
Calcium: 8.9 mg/dL (ref 8.4–10.4)
Chloride: 102 mEq/L (ref 98–107)
Glucose: 112 mg/dl — ABNORMAL HIGH (ref 70–99)
Potassium: 4.2 mEq/L (ref 3.5–5.1)
Sodium: 136 mEq/L (ref 136–145)
Total Protein: 6.2 g/dL — ABNORMAL LOW (ref 6.4–8.3)

## 2012-12-14 LAB — CBC WITH DIFFERENTIAL/PLATELET
BASO%: 0.9 % (ref 0.0–2.0)
EOS%: 5.2 % (ref 0.0–7.0)
LYMPH%: 29.9 % (ref 14.0–49.7)
MCHC: 33.5 g/dL (ref 31.5–36.0)
MCV: 97.2 fL (ref 79.5–101.0)
MONO%: 8.6 % (ref 0.0–14.0)
Platelets: 224 10*3/uL (ref 145–400)
RBC: 3.85 10*6/uL (ref 3.70–5.45)
RDW: 13.6 % (ref 11.2–14.5)
WBC: 5.6 10*3/uL (ref 3.9–10.3)

## 2012-12-14 LAB — FERRITIN: Ferritin: 433 ng/mL — ABNORMAL HIGH (ref 10–291)

## 2012-12-14 MED ORDER — FULVESTRANT 250 MG/5ML IM SOLN
500.0000 mg | Freq: Once | INTRAMUSCULAR | Status: AC
  Administered 2012-12-14: 500 mg via INTRAMUSCULAR
  Filled 2012-12-14: qty 10

## 2012-12-14 NOTE — Progress Notes (Signed)
79 year old female. Widowed. Four children, two girls and two boys. Supportive family. Lives alone in [place].   Referred by Dr. Arline Asp. Tumor stage T4b N1 M0, ER, PR positive, and HER 2 negative with lymph node involvement evident in 08/2008. Patient declined surgery, radiation or chemotherapy. However, patient did begin taking Femara. PET done 09/09/12 showed two lesions seen in the left breast, larger lesion lateral to nipple and new lesion located at 6 o'clock position.   AX: ace inhibitors, amlodipine, and lisinopril No hx of radiation therapy No indication of a pacemaker

## 2012-12-14 NOTE — Progress Notes (Signed)
This office note has been dictated.  #[id number]

## 2012-12-14 NOTE — Progress Notes (Signed)
CC:   Barbara Fee, MD [name], MD [name]. [name], M.D.  PROBLEM LIST:  1. Locally advanced cancer of the left breast diagnosed with biopsy  on 01/02/2009 although changes of breast cancer were apparently  evident in November 2009. There was clinical lymph node  involvement. Tumor stage was T4b N1 M0, stage IIIB. In retrospect,  the PET scan from 01/04/2009 suggests the possibility of small pulmonary metastases which did not show any FDG activity. Estrogen receptor was 100%, progesterone receptor 99%. Proliferative index was 45%. HER-2/neu was negative. The patient declined surgery, radiation and chemotherapy. Femara was started on Feb 02, 2009, and continued through 12/14/2012. The patient had an excellent response to Femara; however, in November 2013 there was suggestion of slow progression involving the left breast. The patient started Faslodex 500 mg on 12/14/2012.  2. Cystic masses involving the right breast with negative biopsy on  01/02/2009.  3. Iron-deficiency anemia requiring hospitalization, 4 units of blood  and intravenous iron. The patient had positive stools but a  negative colonoscopy on 02/28/2009. Feraheme 510 mg IV was given on  11/06/2011, 11/13/2011, 08/12/2012 and 08/19/2012.   4. Hypertension.  5. Hiatal hernia.  6. Cholelithiasis.  7. History of nonsustained ventricular tachycardia.  MEDICATIONS:  Reviewed and recorded. Current Outpatient Prescriptions  Medication Sig Dispense Refill  . aspirin 81 MG tablet Take 81 mg by mouth daily.       . Cholecalciferol 5000 UNITS TABS Take 5,000 Units by mouth daily.      . Ferrous Sulfate (IRON) 325 (65 FE) MG TABS Take 325 mg by mouth daily.      . irbesartan (AVAPRO) 150 MG tablet Take 1 tablet (150 mg total) by mouth daily.  90 tablet  3  . letrozole (FEMARA) 2.5 MG tablet TAKE 1 TABLET BY MOUTH ONCE A DAY  30 tablet  3  . metoprolol (LOPRESSOR) 50 MG tablet Take 1 tablet (50 mg total) by mouth 2 (two) times  daily.  180 tablet  3  . Multiple Vitamin (MULTIVITAMIN) tablet Take 1 tablet by mouth daily.      Marland Kitchen senna (SENOKOT) 8.6 MG tablet Take 1 tablet by mouth as needed.      . vitamin B-12 (CYANOCOBALAMIN) 100 MCG tablet Take 50 mcg by mouth daily.       No current facility-administered medications for this visit.    TREATMENT PROGRAM:  Faslodex 500 mg IM was started on 12/14/2012.  After a loading dose to be given on 12/30/2012 and 01/13/2013, the patient will receive Faslodex every month.  SMOKING HISTORY:  The patient has never smoked cigarettes.   HISTORY:  I saw [name] today for followup of her locally advanced cancer involving the left breast with diagnosis going back to March 2010.  The patient may have had small pulmonary metastases.  This is unproven.  She may also have a small focus of cancer in the right breast as per recent imaging studies.  [name] is currently on Femara 2.5 mg daily.  Occasionally she will take an extra dose for stinging discomfort and some bleeding that has developed in the left breast.  [name] was last seen by [place] on 11/11/2012 and prior to that on 08/19/2012 and 08/05/2012.  We have been concerned over the past few months that the patient has been developing progression of her disease, especially in the left breast.  This has been confirmed by imaging studies, specifically a mammogram and ultrasound of  the left breast carried out on 08/12/2012, a PET scan from 09/09/2012, and the most recent mammogram of the left breast from 12/02/2012.  In addition, the patient has noted some stinging discomfort.  She has had a couple of episodes of bleeding from the nipple-areolar complex.  Aside from this, she feels well.  She is here today with her daughter, [name].  PHYSICAL EXAMINATION:  General:  She looks well, certainly younger and more spry than her stated age of 89.  Weight is 124 pounds 12.8 ounces, height 5 feet 2 inches, body surface area 1.57  sq m.  Vital Signs: Blood pressure today 220/100.  The patient was made aware of her elevated blood pressure.  She says that she has white coat hypertension, although her blood pressures here have been consistently elevated. HEENT:  There is no scleral icterus.  Mouth and pharynx are benign. Lymph nodes:  No peripheral adenopathy palpable in the neck.  There may be a lymph node in the left axilla.  No right axillary adenopathy. Breasts:  I do not perceive any actual masses in the right breast. There are some areas of induration in the upper aspect of the right breast, but for the most part the right breast looks to be normal.  Left breast is markedly distorted with retraction of the nipple-areolar complex.  No active bleeding.  There is a thickening area that measures at least 4 cm.  There is no obvious skin lesion or skin breakdown. There is some blotchy erythema over the left chest and there is a band of blanching erythema underneath the left breast.  I do not think this is cellulitis, but I cannot be absolutely sure.  The patient has been using some lotion on this area.  She knows to call us if the area of erythema seems to be spreading and particularly if she has any fever or chills.  Abdomen:  Benign with no organomegaly or masses palpable. Extremities:  No peripheral edema or clubbing.  Ankles may be a little puffy.  There is no lymphedema of either arm.  Neurologic:  Exam is normal.  LAB DATA:  White count today 5.6, ANC 3.1, hemoglobin 12.5, hematocrit 37.4, platelets 224,000.  Chemistries today notable for an albumin of 3.3, LDH 141, BUN 10, and creatinine 0.7.  Iron studies from 11/11/2012 showed a ferritin of 494 and an iron saturation of 35%.  On 08/05/2012 ferritin was 34 and iron saturation 8%.  It will be recalled that the patient did receive IV Feraheme 510 mg on 08/12/2012 and 08/19/2012. CA 27.29 was 16 and CEA was 2.8 on 08/26/2012.  IMAGING STUDIES:  1. Chest x-ray  on 06/27/2010 showed no acute cardiopulmonary process.  2. Digital diagnostic bilateral mammograms on 07/04/2010 showed the  masses in both breasts.  3. Digital diagnostic bilateral mammograms carried out on 07/31/2011  showed a dense spiculated mass containing central biopsy clip  artifact in the slightly upper outer left breast with associated  nipple and skin retraction without significant change since the  most recent prior study of 07/04/2010. On mammogram, this mass  measures approximately 2.7 x 2.5 x 2.9 cm. No new mass is  identified in the left breast.  On the right side, the circumscribed more superiorly and  posteriorly positioned mass has decreased in size since 07/04/2010.  On the mammogram, it currently measures 3.2 x 3.2 x 2.8 cm, whereas  previously it measured 4.0 x 3.8 x 3.4 cm. The more irregular mass  in the  upper central right breast just anterior to the  circumscribed mass is without significant change. It measured  approximately 2.9 cm in greatest dimension and is felt to be  stable. No new masses identified in the right breast.  4. Chest x-ray, 2 view, from 08/05/2012 showed a large hiatal hernia, but  no acute cardiopulmonary disease.   5. Digital diagnostic bilateral mammogram and bilateral breast ultrasound were  carried out on 08/12/2012. These studies were compared with the study done from 07/31/2011. There was suggestion of progression. A mass in the left breast that measured 3.3 cm on 11/08 had previously measured 2.6 cm by mammogram. There was another mass seen in the lower central aspect of the left breast  measuring 1.1 cm whereas previously on 07/31/2011 it measured 0.6 cm.  Ultrasound was carried out on 08/12/2012. The dominant mass in the left  breast measured 2.5 x 2.4 x 3.0 cm by ultrasound. The smaller lesion by  ultrasound measured 1.1 x 0.7 x 0.9 cm. I do not believe we had an  ultrasound last year for comparison. A lymph node was seen in the left   axilla measuring 1.1 x 0.9 x 1.4 cm. There was obliteration of the  fatty hilum. There were also by ultrasound masses in the right breast.  A mass in the superior aspect of the right breast measured 2.4 x 2.6 x  0.6 cm. This mass was located 3 cm from the nipple. A second mass  which was located 1 cm from the nipple measured 1.0 x 0.8 x 1.1 cm. It  was felt that the known biopsy proven malignancy in the left breast was  progressing. The masses in the right breast were decreasing.  6. Mammogram and ultrasound of the left breast from 08/12/2012 suggested progression compared with the prior mammogram of 07/31/2011.  The mass which is seen in about the 3 o'clock position now measures about 3.3 cm as compared with 2.6 cm previously.  There was another mass seen at around the 6 o'clock position that measures 1.1 cm, whereas previously it measured 0.6 cm.  There was also felt to be a lymph node in the left axilla measuring about 1.4 cm in maximum dimension.  There was some obliteration of the fatty hilum suggesting that this may, in fact, be replaced by cancer.  Ultrasound exam of the right breast showed a mass in the superior aspect measuring 2.4 x 2.6 x 0.6 cm.  The mass is located about 3 cm from the nipple.  There was a second mass located about 1 cm from the nipple and this measured about 1.1 cm.  The masses in the right breast have decreased compared with prior studies, whereas the malignancy in the left breast seems to be progressing.  7. PET scan carried out on 09/09/2012 was compared with the prior PET scan from 01/04/2009.  Once again there are 2 lesions seen in the left breast.  The larger lesion is located at about the 3 o'clock position. There is a smaller lesion at about 6 o'clock which may be new, as well as a left axillary lymph node.  There was previously a dumbbell-shaped lesion in the right breast which was quite large.  There is now a small residual focus corresponding to  the upper part of the previous dumbbell lesion with some increased activity which raises concern that this residual lesion may be malignant.  In reviewing the original PET scan from 01/04/2009, it appears that the patient may have had  some small pulmonary metastases which had regressed on treatment with Femara.  Some of the lesions are still present on the most recent PET scan from 09/09/2012, but clearly are smaller.  8. Digital diagnostic mammogram of the left breast on 12/02/2012 shows progression compared with the prior study from 08/12/2012.  The larger lesion at about 3 o'clock in the left breast now is felt to measure 3.8 cm in maximum dimension.  The second lesion at about 6 o'clock measures 1.1 cm in maximum dimension.  9. Chest x-ray, 2 view, from 12/02/2012 showed some emphysematous changes, but no acute cardiopulmonary findings.  There was a large hiatal hernia.    IMPRESSION AND PLAN:  Mrs. Rabelo is noticing some changes in her left breast with bleeding and some stinging.  We also have several recent studies dating back to the mammogram from 08/12/2012 showing progression in the left breast.  It is possible that the patient may have had small pulmonary metastases at the time of diagnosis going back now 4 years to March 2010.  I have instructed the patient to stop her Femara.  We are going to get her started on Faslodex (fulvestrant) 500 mg IM today to be followed in approximately 2 weeks, on 12/30/2012, with another 500 mg IM and then a return appointment and another dose of Faslodex 500 mg IM on or about April 11th.  We will check CBC and chemistries.  Thereafter, the patient will receive injections every month.  The patient also has a Radiation Oncology appointment for tomorrow for consideration of treatments to the left breast.  I was concerned about the appearance of the left breast, specifically the erythema under the breast which certainly could be due  to cellulitis.  I was not convinced, since it was a rather unusual distribution, more of a band.  The patient does not seem to be toxic or sick.  The patient and her daughter will keep an eye on this and call us if there is any evidence of spreading of the erythema or any other symptoms to suggest cellulitis such as fever or chills.    ______________________________ Samul Dada, M.D. DSM/MEDQ  D:  12/14/2012  T:  12/14/2012  Job:  [id number]

## 2012-12-14 NOTE — Progress Notes (Signed)
Patient reports bloody nipple discharge from left breast. MD made aware.

## 2012-12-14 NOTE — Patient Instructions (Signed)
Do not take any more Femara.  The new medicine for your breast cancer is Faslodex.  Keep a watch on the red area below your left breast.  If it seems to be spreading or if you develop fever and/or chills, you must call us.

## 2012-12-15 ENCOUNTER — Telehealth: Payer: Self-pay | Admitting: *Deleted

## 2012-12-15 ENCOUNTER — Ambulatory Visit: Admission: RE | Admit: 2012-12-15 | Payer: Medicare Other | Source: Ambulatory Visit | Admitting: Radiation Oncology

## 2012-12-15 ENCOUNTER — Ambulatory Visit: Admission: RE | Admit: 2012-12-15 | Payer: Medicare Other | Source: Ambulatory Visit

## 2012-12-15 NOTE — Telephone Encounter (Signed)
Called patient to alter [place] visit for 12-15-12, lvm for a return call

## 2012-12-16 ENCOUNTER — Encounter: Payer: Self-pay | Admitting: Medical Oncology

## 2012-12-16 ENCOUNTER — Other Ambulatory Visit: Payer: Self-pay | Admitting: Medical Oncology

## 2012-12-16 ENCOUNTER — Telehealth: Payer: Self-pay | Admitting: Medical Oncology

## 2012-12-16 MED ORDER — CEFUROXIME AXETIL 500 MG PO TABS: 500.0000 mg | ORAL_TABLET | Freq: Two times a day (BID) | ORAL | Status: DC

## 2012-12-16 NOTE — Telephone Encounter (Signed)
[name]-daughter called stating that the redness on her mother's left breast is not better. She would like to know if Dr. Arline Asp could give her mother an antibiotic. I spoke with Dr. Arline Asp and he feels this is her cancer and asked if they got an appointment to see Dr. Roselind Messier. It looks like they never rescheduled the appointment. I called [name] to let her know that we calledl in a prescription and Dr. Arline Asp really would like for her to see radiation oncology.[name] states she never heard from them. I explained that there is a note in the computer that [place] in rad-onc had left a message. [name] states that she will call to reschedule. Dr. Arline Asp notified.

## 2012-12-18 ENCOUNTER — Telehealth: Payer: Self-pay | Admitting: Oncology

## 2012-12-18 NOTE — Telephone Encounter (Signed)
S/w dtr [name] re appts for 3/28 and 4/10.

## 2012-12-29 ENCOUNTER — Encounter: Payer: Self-pay | Admitting: Radiation Oncology

## 2012-12-30 ENCOUNTER — Ambulatory Visit (HOSPITAL_BASED_OUTPATIENT_CLINIC_OR_DEPARTMENT_OTHER): Payer: Medicare Other

## 2012-12-30 ENCOUNTER — Ambulatory Visit
Admission: RE | Admit: 2012-12-30 | Discharge: 2012-12-30 | Disposition: A | Discharge status: Home / Self Care | Payer: Medicare Other | Source: Ambulatory Visit | Attending: Radiation Oncology | Admitting: Radiation Oncology

## 2012-12-30 ENCOUNTER — Encounter: Payer: Self-pay | Admitting: Radiation Oncology

## 2012-12-30 VITALS — BP 130/88 | HR 56 | Temp 97.2°F | Resp 18

## 2012-12-30 VITALS — BP 148/90 | HR 61 | Temp 97.5°F | Resp 20 | Ht 62.0 in | Wt 124.1 lb

## 2012-12-30 DIAGNOSIS — Z170 Estrogen receptor positive status [ER+]: Secondary | ICD-10-CM | POA: Insufficient documentation

## 2012-12-30 DIAGNOSIS — I10 Essential (primary) hypertension: Secondary | ICD-10-CM | POA: Insufficient documentation

## 2012-12-30 DIAGNOSIS — C50919 Malignant neoplasm of unspecified site of unspecified female breast: Secondary | ICD-10-CM | POA: Insufficient documentation

## 2012-12-30 DIAGNOSIS — C50119 Malignant neoplasm of central portion of unspecified female breast: Secondary | ICD-10-CM | POA: Insufficient documentation

## 2012-12-30 DIAGNOSIS — C50112 Malignant neoplasm of central portion of left female breast: Secondary | ICD-10-CM

## 2012-12-30 DIAGNOSIS — C50912 Malignant neoplasm of unspecified site of left female breast: Secondary | ICD-10-CM

## 2012-12-30 DIAGNOSIS — R918 Other nonspecific abnormal finding of lung field: Secondary | ICD-10-CM | POA: Insufficient documentation

## 2012-12-30 DIAGNOSIS — C50419 Malignant neoplasm of upper-outer quadrant of unspecified female breast: Secondary | ICD-10-CM

## 2012-12-30 DIAGNOSIS — Z5111 Encounter for antineoplastic chemotherapy: Secondary | ICD-10-CM

## 2012-12-30 DIAGNOSIS — Z79899 Other long term (current) drug therapy: Secondary | ICD-10-CM | POA: Insufficient documentation

## 2012-12-30 DIAGNOSIS — K219 Gastro-esophageal reflux disease without esophagitis: Secondary | ICD-10-CM | POA: Insufficient documentation

## 2012-12-30 HISTORY — DX: Hemorrhage, not elsewhere classified: R58

## 2012-12-30 HISTORY — DX: Other nonspecific abnormal finding of lung field: R91.8

## 2012-12-30 HISTORY — DX: Erythematous condition, unspecified: L53.9

## 2012-12-30 HISTORY — DX: Long term (current) use of other agents affecting estrogen receptors and estrogen levels: Z79.818

## 2012-12-30 MED ORDER — FULVESTRANT 250 MG/5ML IM SOLN
500.0000 mg | Freq: Once | INTRAMUSCULAR | Status: AC
  Administered 2012-12-30: 500 mg via INTRAMUSCULAR
  Filled 2012-12-30: qty 10

## 2012-12-30 NOTE — Addendum Note (Signed)
Encounter addended by: Delynn Flavin, RN on: 12/30/2012  6:16 PM<BR>     Documentation filed: Charges VN

## 2012-12-30 NOTE — Patient Instructions (Signed)
Call MD for problems or concerns 

## 2012-12-30 NOTE — Progress Notes (Signed)
New Consult  Locally advanced Left breast cancer  Dx with bx 01/02/2009  ER/PR + Her2 neu -, pt declined surgery,radiation and chemotherapy at that time, was on Femara 02/02/2009-12/14/12, Faslodex 500 mg started 12/14/12  Alert,oriented x3, no c/o pain, states"the antibiotic cleared up my left breast, skin intact,nipple dimpled with scab

## 2012-12-30 NOTE — Progress Notes (Addendum)
Radiation Oncology         [phone]) [phone] ________________________________  Initial outpatient Consultation  Name: [name] MRN: [id number]  Date: 12/30/2012  DOB: 1921-11-22  ZD:[id number] Barbara Ruiz, MD  Murinson, Gerarda Fraction, MD   REFERRING PHYSICIAN: Murinson, Gerarda Fraction, MD  DIAGNOSIS: [phone] Left breast invasive ductal carcinoma, ER/PR positive HER-2/neu negative with pulmonary nodules at diagnosis of unclear etiology  HISTORY OF PRESENT ILLNESS::[name] is a 79 y.o. female here with her daughter today who was diagnosed 4 years ago with locally advanced left breast cancer. Biopsy was on 01/02/2009 revealing pathology as described above. The patient was seen by a surgeon in [place], but they do not remember his name. The patient ultimately did not pursue surgery. She did pursue Femara under the care of Dr. Arline Asp. She has been on this for almost 4 years. Several months ago, she showed evidence of slow progression of the left breast mass. She also recently showed some erythematous changes over her breast, questionable for cellulitis. She was recently switched to Faslodex, and placed on antibiotic. The patient reports that her skin changes resolved very nicely with the antibiotic. She also reports that the general malaise that she felt when she had the skin erythema has improved since dry in the antibiotic. she denies any weight loss. She does report bleeding from the skin of her left breast, which started weeks before she was switched to Faslodex. This has scabbed over.  PREVIOUS RADIATION THERAPY: No  PAST MEDICAL HISTORY:  has a past medical history of Anemia, unspecified; Malignant neoplasm of breast (female), unspecified site; Anemia, iron deficiency; Hiatal hernia; HTN (hypertension); Non-sustained ventricular tachycardia; Cholelithiasis; Breast cancer, left breast (10/28/2011); GERD (gastroesophageal reflux disease); Pulmonary nodules/lesions, multiple (09/09/12); Use of fulvestrant  (Faslodex) (12/14/12); Erythema (12/14/12); and Bleeding (12/14/12).    PAST SURGICAL HISTORY:History reviewed. No pertinent past surgical history.  FAMILY HISTORY: family history includes Blindness in her maternal grandmother.  There is no history of Cancer.  SOCIAL HISTORY:  reports that she has never smoked. She has never used smokeless tobacco. She reports that she does not drink alcohol or use illicit drugs.  ALLERGIES: Ace inhibitors; Amlodipine; and Lisinopril  MEDICATIONS:  Current Outpatient Prescriptions  Medication Sig Dispense Refill  . aspirin 81 MG tablet Take 81 mg by mouth daily.       . Cholecalciferol 5000 UNITS TABS Take 5,000 Units by mouth daily.      . Ferrous Sulfate (IRON) 325 (65 FE) MG TABS Take 325 mg by mouth daily.      . irbesartan (AVAPRO) 150 MG tablet Take 1 tablet (150 mg total) by mouth daily.  90 tablet  3  . metoprolol (LOPRESSOR) 50 MG tablet Take 1 tablet (50 mg total) by mouth 2 (two) times daily.  180 tablet  3  . Multiple Vitamin (MULTIVITAMIN) tablet Take 1 tablet by mouth daily.      Marland Kitchen senna (SENOKOT) 8.6 MG tablet Take 1 tablet by mouth as needed.      . vitamin B-12 (CYANOCOBALAMIN) 100 MCG tablet Take 50 mcg by mouth daily.      . cefUROXime (CEFTIN) 500 MG tablet Take 1 tablet (500 mg total) by mouth 2 (two) times daily.  14 tablet  0  . letrozole (FEMARA) 2.5 MG tablet TAKE 1 TABLET BY MOUTH ONCE A DAY  30 tablet  3   No current facility-administered medications for this encounter.    REVIEW OF SYSTEMS:    Pertinent items are noted in  HPI.   PHYSICAL EXAM:  height is 5\' 2"  (1.575 m) and weight is 124 lb 1.6 oz (56.291 kg). Her oral temperature is 97.5 F (36.4 C). Her blood pressure is 148/90 and her pulse is 61. Her respiration is 20 and oxygen saturation is 100%.   General: Alert and oriented, in no acute distress HEENT: Head is normocephalic. Eye sensitive to direct  light. Extraocular movements are intact. Oropharynx is dentures and is  clear. Neck: Neck is supple, no palpable cervical or supraclavicular lymphadenopathy. Heart: Regular in rate and rhythm with no murmurs, rubs, or gallops. Chest: Clear to auscultation bilaterally, with no rhonchi, wheezes, or rales. Abdomen: Soft, nontender, nondistended, with no rigidity or guarding. Extremities: No cyanosis or edema. Lymphatics: No obvious lymphadenopathy. Skin/Breasts: Right breast - no obvious abnormality, but nipple is a little retracted. Left breast - Puckered appearance in the central/lateral aspect of the breast with underlying firm mass, measuring approximately 4 cm in greatest dimension. There is an overlying scab in the area of puckered skin. No clear axillary lymphadenopathy on either side  Neurologic: Cranial nerves II through XII are grossly intact. No obvious focalities. Speech is fluent. Coordination is intact. Psychiatric: Judgment and insight are intact. Affect is appropriate.   LABORATORY DATA:  Lab Results  Component Value Date   WBC 5.6 12/14/2012   HGB 12.5 12/14/2012   HCT 37.4 12/14/2012   MCV 97.2 12/14/2012   PLT 224 12/14/2012   CMP     Component Value Date/Time   NA 136 12/14/2012 1158   NA 139 03/18/2012 1339   K 4.2 12/14/2012 1158   K 4.3 03/18/2012 1339   CL 102 12/14/2012 1158   CL 105 03/18/2012 1339   CO2 26 12/14/2012 1158   CO2 27 03/18/2012 1339   GLUCOSE 112* 12/14/2012 1158   GLUCOSE 98 03/18/2012 1339   BUN 10.4 12/14/2012 1158   BUN 13 03/18/2012 1339   CREATININE 0.7 12/14/2012 1158   CREATININE 0.78 03/18/2012 1339   CALCIUM 8.9 12/14/2012 1158   CALCIUM 9.1 03/18/2012 1339   PROT 6.2* 12/14/2012 1158   PROT 6.5 03/18/2012 1339   ALBUMIN 3.3* 12/14/2012 1158   ALBUMIN 4.2 03/18/2012 1339   AST 15 12/14/2012 1158   AST 18 03/18/2012 1339   ALT 11 12/14/2012 1158   ALT 16 03/18/2012 1339   ALKPHOS 90 12/14/2012 1158   ALKPHOS 96 03/18/2012 1339   BILITOT 0.35 12/14/2012 1158   BILITOT 0.3 03/18/2012 1339   GFRNONAA >60 08/24/2008 0850    GFRAA  Value: >60        The eGFR has been calculated using the MDRD equation. This calculation has not been validated in all clinical 08/24/2008 0850         RADIOGRAPHY:  PET SCAN Reviewed from Dec 2013.  Dg Chest 2 View  12/02/2012  *RADIOLOGY REPORT*  Clinical Data: Breast cancer, history lung nodules, rule out metastasis  CHEST - 2 VIEW  Comparison: Chest radiograph 08/05/2012  Findings: Normal cardiac silhouette.  Large hiatal hernia with air- fluid level is again noted.  Hepatic flexure is noted beneath the right hemidiaphragm.  Lungs are clear without evidence effusion, infiltrate, or pneumothorax.  There is flattening hemidiaphragms and mild bronchitic change.  IMPRESSION:  1.  No acute cardiopulmonary findings.  2.  Emphysematous change.  3.  Large hiatal hernia.   Original Report Authenticated By: Genevive Bi, M.D.    Mm Digital Diagnostic Unilat L  12/02/2012  *RADIOLOGY REPORT*  Clinical Data:  Known left breast cancer being treated with Femara. New mass more inferiorly in the left breast with increased uptake on a recent PET CT, suspicious for a  second focus of malignancy.  DIGITAL DIAGNOSTIC LEFT MAMMOGRAM WITH CAD  Comparison: Previous imaging examinations, including mammograms, ultrasounds, MRI and PET CT.  Findings:  ACR Breast Density Category 2: There is a scattered fibroglandular pattern.  The patient's known malignancy in the superior aspect of the left breast is slightly larger than seen on 08/12/2012.  This currently measures 3.8 cm in maximum diameter previously measured 3.4 cm in maximum diameter.  There is a second mass more inferiorly and posteriorly, currently measuring 1.1 cm in maximum diameter previously measuring 1.0 cm in maximum diameter.  This corresponds to the second mass seen on the recent PET CT.  This has slightly more irregular and ill defined margins than previously seen.  Mammographic images were processed with CAD.  IMPRESSION: Mild increase in size of  the patient's known malignancy in the left breast with a mild increase in size and irregularity of a second mass more inferiorly in the breast.  This second mass is suspicious for a second focus of malignancy.  This would need further evaluation with ultrasound and probable ultrasound-guided core needle biopsy for complete characterization.  In speaking with the patient and her daughter today, they declined ultrasound at this time due to the feeling that this would not change the patient's choice of treatment.  This can be performed at a later date if clinically desired.  RECOMMENDATION: Left breast ultrasound (refused by the patient and her daughter at this time).  I have discussed the findings and recommendations with the patient. Results were also provided in writing at the conclusion of the visit.  BI-RADS CATEGORY 0:  Incomplete.  Need additional imaging evaluation and/or prior mammograms for comparison.   Original Report Authenticated By: Beckie Salts, M.D.       IMPRESSION/PLAN: This is a lovely 79 year old woman who opted for antiestrogen therapy alone after being diagnosed with locally advanced breast cancer 4 years ago. She has done well in Femara, but most recently has shown some slow interval progression of her left breast mass. She recently had what was probably cellulitis and has improved nicely with antibiotics. She's been switched to Faslodex to try to address the growth of her tumor.  Overall, she feels well. I think it is reasonable for her to continue with Faslodex. I do not think that radiotherapy is indicated at this time. If the patient progresses in spite of Faslodex, I think it's reasonable for her to get a surgical opinion as surgery is the best option for long-term local control. I explained to the patient and her daughter that radiotherapy alone would not be likely to be provide long-term control of her breast mass, as bulky breast cancer is not terribly radiosensitive.  However,  radiotherapy could be considered adjuvantly for local control after surgery; or, if surgery is not recommended, and the patient has disease resistant to hormonal therapy, I would consider radiotherapy alone as an option.  The patient and her daughter are pleased with this plan. I'm happy to see them back on an as-needed basis. All questions answered today.  I spent 25 minutes minutes face to face with the patient and more than 50% of that time was spent in counseling and/or coordination of care.    __________________________________________   Lonie Peak, MD

## 2013-01-09 ENCOUNTER — Telehealth: Payer: Self-pay | Admitting: Medical Oncology

## 2013-01-09 ENCOUNTER — Other Ambulatory Visit: Payer: Self-pay | Admitting: Medical Oncology

## 2013-01-09 ENCOUNTER — Telehealth: Payer: Self-pay | Admitting: Oncology

## 2013-01-09 NOTE — Telephone Encounter (Signed)
Pt dtr called today to r/s 4/10 appt. Per dtr she can only come on Friday. dtr given new appt for 4/25 @ 9:30am for lb/fu/flush.

## 2013-01-09 NOTE — Telephone Encounter (Signed)
Daughter Melina Schools- called and left a message that she can not bring her mother to see  Dr. Arline Asp on Thursdays. She called and moved her appt to 01/27/13. She states her mother is suppose to have her chemo injections and did not know if she needs to come in or wait. I called her back and left her a message that she does need her faslodex this week. I made a POF for an injection 01/13/13. I asked her to call if she does not hear from scheduling in the next day or two.

## 2013-01-10 ENCOUNTER — Telehealth: Payer: Self-pay | Admitting: Oncology

## 2013-01-12 ENCOUNTER — Ambulatory Visit: Payer: Medicare Other | Admitting: Physician Assistant

## 2013-01-12 ENCOUNTER — Other Ambulatory Visit: Payer: Medicare Other | Admitting: Lab

## 2013-01-13 ENCOUNTER — Ambulatory Visit (HOSPITAL_BASED_OUTPATIENT_CLINIC_OR_DEPARTMENT_OTHER): Payer: Medicare Other

## 2013-01-13 VITALS — BP 176/84 | HR 68 | Temp 97.1°F

## 2013-01-13 DIAGNOSIS — Z5111 Encounter for antineoplastic chemotherapy: Secondary | ICD-10-CM

## 2013-01-13 DIAGNOSIS — C50419 Malignant neoplasm of upper-outer quadrant of unspecified female breast: Secondary | ICD-10-CM

## 2013-01-13 DIAGNOSIS — C50912 Malignant neoplasm of unspecified site of left female breast: Secondary | ICD-10-CM

## 2013-01-13 MED ORDER — FULVESTRANT 250 MG/5ML IM SOLN
500.0000 mg | Freq: Once | INTRAMUSCULAR | Status: AC
  Administered 2013-01-13: 500 mg via INTRAMUSCULAR
  Filled 2013-01-13: qty 10

## 2013-01-13 NOTE — Patient Instructions (Addendum)
Fulvestrant injection What is this medicine? FULVESTRANT (ful VES trant) blocks the effects of estrogen. It is used to treat breast cancer in women past the age of menopause. This medicine may be used for other purposes; ask your health care provider or pharmacist if you have questions. What should I tell my health care provider before I take this medicine? They need to know if you have any of these conditions: -bleeding problems -liver disease -low levels of platelets in the blood -an unusual or allergic reaction to fulvestrant, other medicines, foods, dyes, or preservatives -pregnant or trying to get pregnant -breast-feeding How should I use this medicine? This medicine is for injection into a muscle. It is usually given by a health care professional in a hospital or clinic setting. Talk to your pediatrician regarding the use of this medicine in children. Special care may be needed. Overdosage: If you think you have taken too much of this medicine contact a poison control center or emergency room at once. NOTE: This medicine is only for you. Do not share this medicine with others. What if I miss a dose? It is important not to miss your dose. Call your doctor or health care professional if you are unable to keep an appointment. What may interact with this medicine? -medicines that treat or prevent blood clots like warfarin, enoxaparin, and dalteparin This list may not describe all possible interactions. Give your health care provider a list of all the medicines, herbs, non-prescription drugs, or dietary supplements you use. Also tell them if you smoke, drink alcohol, or use illegal drugs. Some items may interact with your medicine. What should I watch for while using this medicine? Your condition will be monitored carefully while you are receiving this medicine. You will need important blood work done while you are taking this medicine. Do not become pregnant while taking this medicine.  Women should inform their doctor if they wish to become pregnant or think they might be pregnant. There is a potential for serious side effects to an unborn child. Talk to your health care professional or pharmacist for more information. What side effects may I notice from receiving this medicine? Side effects that you should report to your doctor or health care professional as soon as possible: -allergic reactions like skin rash, itching or hives, swelling of the face, lips, or tongue -feeling faint or lightheaded, falls -fever or flu-like symptoms -sore throat -vaginal bleeding Side effects that usually do not require medical attention (report to your doctor or health care professional if they continue or are bothersome): -aches, pains -constipation or diarrhea -headache -hot flashes -nausea, vomiting -pain at site where injected -stomach pain This list may not describe all possible side effects. Call your doctor for medical advice about side effects. You may report side effects to FDA at 1-800-FDA-1088. Where should I keep my medicine? This drug is given in a hospital or clinic and will not be stored at home. NOTE: This sheet is a summary. It may not cover all possible information. If you have questions about this medicine, talk to your doctor, pharmacist, or health care provider.  2013, [employer]. (01/30/2008 3:39:24 PM)  

## 2013-01-16 ENCOUNTER — Telehealth: Payer: Self-pay | Admitting: Medical Oncology

## 2013-01-16 NOTE — Telephone Encounter (Signed)
Robyn-daughter called and left a message that she thinks her mother is having an allergic reaction to something. She has a rash and has had some trouble swallowing. She asked if it could be the faslodex. She states her mother also started a new blood pressure drug. I called Robyn back to get more information about the rash and symptoms but only got her voice mail. I asked her to call me. I spoke with Dr. Arline Asp and this is the pt's third injeciton of faslodex.

## 2013-01-18 ENCOUNTER — Telehealth: Payer: Self-pay

## 2013-01-18 NOTE — Telephone Encounter (Signed)
[name] called earlier stating that pt was having rash to face, chest and arms and some difficulty swallowing. [name] asked if there was any commonality between avapro and her other allergies. The pt did stop her irbesartan and the rash is better after 2 days, still present but diminished. I s/w Dr Arline Asp and he does not feel the faslodex would be the cause of the rash. I lvm that Dr Oliver Barre would be the one to address blood pressure issues.

## 2013-01-24 ENCOUNTER — Telehealth: Payer: Self-pay | Admitting: *Deleted

## 2013-01-24 NOTE — Telephone Encounter (Signed)
Called left msg on triage stating think mom is having a allergic reaction to her avapro. Same symptoms as before. Did check with Dr. Arline Asp office rash isn't coming from the cancer drug. Called daughter back no answer LMOM pt will need to have office visit. Last saw dr. Jonny Ruiz 07/2012

## 2013-01-27 ENCOUNTER — Other Ambulatory Visit (HOSPITAL_BASED_OUTPATIENT_CLINIC_OR_DEPARTMENT_OTHER): Payer: Medicare Other | Admitting: Lab

## 2013-01-27 ENCOUNTER — Ambulatory Visit (HOSPITAL_BASED_OUTPATIENT_CLINIC_OR_DEPARTMENT_OTHER): Payer: Medicare Other | Admitting: Physician Assistant

## 2013-01-27 VITALS — BP 176/85 | HR 57 | Temp 97.1°F | Resp 18 | Ht 62.0 in | Wt 123.9 lb

## 2013-01-27 DIAGNOSIS — C50912 Malignant neoplasm of unspecified site of left female breast: Secondary | ICD-10-CM

## 2013-01-27 DIAGNOSIS — D539 Nutritional anemia, unspecified: Secondary | ICD-10-CM

## 2013-01-27 DIAGNOSIS — C50419 Malignant neoplasm of upper-outer quadrant of unspecified female breast: Secondary | ICD-10-CM

## 2013-01-27 DIAGNOSIS — C773 Secondary and unspecified malignant neoplasm of axilla and upper limb lymph nodes: Secondary | ICD-10-CM

## 2013-01-27 DIAGNOSIS — Z170 Estrogen receptor positive status [ER+]: Secondary | ICD-10-CM

## 2013-01-27 DIAGNOSIS — C50112 Malignant neoplasm of central portion of left female breast: Secondary | ICD-10-CM

## 2013-01-27 LAB — CBC WITH DIFFERENTIAL/PLATELET
Eosinophils Absolute: 0.2 10*3/uL (ref 0.0–0.5)
MCV: 97.9 fL (ref 79.5–101.0)
MONO%: 11 % (ref 0.0–14.0)
NEUT#: 3.2 10*3/uL (ref 1.5–6.5)
RBC: 3.91 10*6/uL (ref 3.70–5.45)
RDW: 13.4 % (ref 11.2–14.5)
WBC: 5.6 10*3/uL (ref 3.9–10.3)
lymph#: 1.6 10*3/uL (ref 0.9–3.3)

## 2013-01-27 LAB — COMPREHENSIVE METABOLIC PANEL (CC13)
ALT: 12 U/L (ref 0–55)
Alkaline Phosphatase: 101 U/L (ref 40–150)
CO2: 27 mEq/L (ref 22–29)
Calcium: 8.5 mg/dL (ref 8.4–10.4)
Sodium: 137 mEq/L (ref 136–145)
Total Bilirubin: <0.2 mg/dL (ref 0.20–1.20)
Total Protein: 6.2 g/dL — ABNORMAL LOW (ref 6.4–8.3)

## 2013-01-27 LAB — LACTATE DEHYDROGENASE (CC13): LDH: 135 U/L (ref 125–245)

## 2013-01-27 NOTE — Progress Notes (Signed)
[facility]  [phone]    OFFICE PROGRESS NOTE  CC: [name], MD  [name], MD  [name]. [name], M.D.   PROBLEM LIST:  1. Locally advanced cancer of the left breast diagnosed with biopsy on 01/02/2009 although changes of breast cancer were apparently evident in November 2009. There was clinical lymph node involvement. Tumor stage was T4b N1 M0, stage IIIB. In retrospect, the PET scan from 01/04/2009 suggests the possibility of small pulmonary metastases which did not show any FDG activity. Estrogen receptor was 100%, progesterone receptor 99%. Proliferative index was 45%. HER-2/neu was negative. The patient declined surgery, radiation and chemotherapy. Femara was started on Feb 02, 2009, and continued through 12/14/2012. The patient had an excellent response to Femara; however, in November 2013 there was suggestion of slow progression involving the left breast. The patient started Faslodex 500 mg on 12/14/2012. 3. Iron-deficiency anemia requiring hospitalization, 4 units of blood and intravenous iron. The patient had positive stools but a negative colonoscopy on 02/28/2009. Feraheme 510 mg IV was given on 11/06/2011, 11/13/2011, 08/12/2012 and 08/19/2012.  4. Hypertension.  5. Hiatal hernia.  6. Cholelithiasis.  7. History of nonsustained ventricular tachycardia.   TREATMENT PROGRAM: Faslodex 500 mg IM was started on 12/14/2012. After a loading dose to be given on 12/30/2012 and 01/13/2013, the patient  will receive Faslodex every month, that is on 02/10/13 (Friday).    SMOKING HISTORY: The patient has never smoked cigarettes   MEDICATIONS:  Current Outpatient Prescriptions  Medication Sig Dispense Refill  . aspirin 81 MG tablet Take 81 mg by mouth daily.       . Cholecalciferol 5000 UNITS TABS Take 5,000 Units by mouth daily.      . Ferrous Sulfate (IRON) 325 (65 FE) MG TABS Take 325 mg by mouth daily.      . metoprolol (LOPRESSOR) 50 MG  tablet Take 1 tablet (50 mg total) by mouth 2 (two) times daily.  180 tablet  3  . Multiple Vitamin (MULTIVITAMIN) tablet Take 1 tablet by mouth daily.      Marland Kitchen senna (SENOKOT) 8.6 MG tablet Take 1 tablet by mouth as needed.      . vitamin B-12 (CYANOCOBALAMIN) 100 MCG tablet Take 50 mcg by mouth daily.       No current facility-administered medications for this visit.    ALLERGIES:   Allergies  Allergen Reactions  . Avapro (Irbesartan) Rash  . Ace Inhibitors Itching  . Amlodipine Nausea Only and Rash    Leg cramps  . Lisinopril Rash    HISTORY: Marca Gadsby returns  today for followup of her locally advanced cancer involving the left breast with diagnosis going back to March 2010. The patient may have had small pulmonary metastases. This is unproven. She may also have a small focus of cancer in the right breast as per recent imaging studies. Mrs. Conyer is off Femara 2.5 mg daily since 12/14/12.Mrs. Kraft was last seen by [place] on  On 12/14/2012 and prior to that on 02/07/2014We have been concerned over the past few months that the patient has been developing progression of her disease, especially in the left breast. This has been confirmed by imaging studies, specifically a mammogram and ultrasound of the left breast carried out on 08/12/2012, a PET scan from 09/09/2012, and the most  recent mammogram of the left breast from 12/02/2012. In addition, the patient has noted some stinging discomfort. She has had a couple of episodes  of bleeding from the nipple-areolar complex. After her visit, she was placed on Keflex for superficial skin infection with resolution.SHe was seen by Dr. Basilio Cairo, for Radiation Oncology consultation, who recommended that the patient continue with Faslodex, without need to recur to radiation therapy to the left breast. Perhaps a surgical consultation in the future may be of consideration, if she were a candidate.   In addition, she develop a skin rash on the dorsal aspect of her  arms, chest and face, accompanied by some laryngeal spasm,  secondary to Avapro. This med has been discontinued for over a week, and the symptoms are much improved. She denies trouble swallowing, Her rash is still present, but according to her is resolving.  She is here today with her daughter, [name].The patient reports feeling well, responding to Faslodex without side effects. No other complaints verbalized.   PHYSICAL EXAMINATION:   Filed Vitals:   01/27/13 1000  BP: 176/85  Pulse: 57  Temp: 97.1 F (36.2 C)  Resp: 18   Filed Weights   01/27/13 1000  Weight: 123 lb 14.4 oz (56.201 kg)    General: She looks well, certainly younger and more spry than her stated age of 59.  HEENT: There is no scleral icterus. Mouth and pharynx are benign.  Lymph nodes: No peripheral adenopathy palpable in the neck. There may be a lymph node in the left axilla. No right axillary adenopathy. Breasts: I do not perceive any actual masses in the right breast. There are some areas of induration in the upper aspect of the right breast, but for the most part the right breast looks to be normal. Left breast is markedly distorted with retraction of the nipple-areolar complex. No active bleeding. There is a thickening area that measures at least 3 cm. There is no obvious skin lesion or skin breakdown. The band of blanching erythema underneath the left breast seen in prior exam is now resolved.Abdomen: Benign with no organomegaly or masses palpable. Extremities: No peripheral edema or clubbing. Ankles may be a little puffy. There is no lymphedema of either arm. Neurologic: Exam is normal.     LABORATORY/RADIOLOGY DATA:   Recent Labs Lab 01/27/13 0947  WBC 5.6  HGB 13.1  HCT 38.3  PLT 203  MCV 97.9  MCH 33.4  MCHC 34.1  RDW 13.4  LYMPHSABS 1.6  MONOABS 0.6  EOSABS 0.2  BASOSABS 0.0    CMP    Recent Labs Lab 01/27/13 0948  NA 137  K 4.1  CL 104  CO2 27  GLUCOSE 106*  BUN 9.7  CREATININE 0.7   CALCIUM 8.5  AST 13  ALT 12  ALKPHOS 101  BILITOT <0.20 Repeated and Verified     On 12/14/12, White count was 5.6, ANC 3.1, hemoglobin 12.5, hematocrit 37.4, platelets 224,000. Chemistries on that day were notable for an albumin of  3.3, LDH 141, BUN 10, and creatinine 0.7. Iron studies from 11/11/2012 showed a ferritin of 494 and an iron saturation of 35%. On 08/05/2012 ferritin was 34 and iron saturation 8%. It will be recalled that the patient did receive IV Feraheme 510 mg on 08/12/2012 and 08/19/2012. CA 27.29 was 16 and CEA was 2.8 on 08/26/2012.      Radiology Studies:  1. Chest x-ray on 06/27/2010 showed no acute cardiopulmonary process.  2. Digital diagnostic bilateral mammograms on 07/04/2010 showed the masses in both breasts.  3. Digital diagnostic bilateral mammograms carried out on 07/31/2011 showed a dense spiculated mass containing central biopsy  clip artifact in the slightly upper outer left breast with associated nipple and skin retraction without significant change since the most recent prior study of 07/04/2010. On mammogram, this mass measures approximately 2.7 x 2.5 x 2.9 cm. No new mass is identified in the left breast. On the right side, the circumscribed more superiorly and posteriorly positioned mass has decreased in size since 07/04/2010. On the mammogram, it currently measures 3.2 x 3.2 x 2.8 cm, whereas  previously it measured 4.0 x 3.8 x 3.4 cm. The more irregular mass in the upper central right breast just anterior to the circumscribed mass is without significant change. It measured approximately 2.9 cm in greatest dimension and is felt to be stable. No new masses identified in the right breast.  4. Chest x-ray, 2 view, from 08/05/2012 showed a large hiatal hernia, but no acute cardiopulmonary disease.  5. Digital diagnostic bilateral mammogram and bilateral breast ultrasound were carried out on 08/12/2012. These studies were compared with the study done from  07/31/2011. There was suggestion of progression. A mass in the left breast that measured 3.3 cm on 11/08 had previously measured 2.6 cm by mammogram. There was another mass seen in the lower central aspect of the left breast measuring 1.1 cm whereas previously on 07/31/2011 it measured 0.6 cm. Ultrasound was carried out on 08/12/2012. The dominant mass in the left breast measured 2.5 x 2.4 x 3.0 cm by ultrasound. The smaller lesion by  ultrasound measured 1.1 x 0.7 x 0.9 cm. I do not believe we had an ultrasound last year for comparison. A lymph node was seen in the left axilla measuring 1.1 x 0.9 x 1.4 cm. There was obliteration of the fatty hilum. There were also by ultrasound masses in the right breast. A mass in the superior aspect of the right breast measured 2.4 x 2.6 x 0.6 cm. This mass was located 3 cm from the nipple. A second mass which was located 1 cm from the nipple measured 1.0 x 0.8 x 1.1 cm. It was felt that the known biopsy proven malignancy in the left breast was progressing. The masses in the right breast were decreasing.  6. Mammogram and ultrasound of the left breast from 08/12/2012 suggested progression compared with the prior mammogram of 07/31/2011. The mass  which is seen in about the 3 o'clock position now measures about 3.3 cm as compared with 2.6 cm previously. There was another mass seen at around the 6 o'clock position that measures 1.1 cm, whereas previously it measured 0.6 cm. There was also felt to be a lymph node in the left axilla measuring about 1.4 cm in maximum dimension. There was some obliteration of the fatty hilum suggesting that this may, in fact, be replaced by cancer. Ultrasound exam of the right breast showed a mass in the superior aspect measuring 2.4 x 2.6 x 0.6 cm. The mass is located about 3 cm from the nipple. There was a second mass located about 1 cm from the nipple and this measured about 1.1 cm. The masses in the right breast have decreased compared with  prior studies, whereas the malignancy in the left breast seems to be progressing.  7. PET scan carried out on 09/09/2012 was compared with the prior PET scan from 01/04/2009. Once again there are 2 lesions seen in the left breast. The larger lesion is located at about the 3 o'clock position. There is a smaller lesion at about 6 o'clock which may be new, as well as a  left axillary lymph node. There was previously a dumbbell-shaped lesion in the right breast which was quite large. There is now a small residual focus corresponding to the upper part of the previous dumbbell lesion with some increased activity which raises concern that this residual lesion may be malignant. In reviewing the original PET scan from 01/04/2009, it appears that the patient may have had some small pulmonary metastases which had regressed on treatment with Femara. Some of the lesions are still present on the most recent PET scan from 09/09/2012, but clearly are smaller. 8. Digital diagnostic mammogram of the left breast on 12/02/2012 shows progression compared with the prior study from 08/12/2012. The larger lesion at about 3 o'clock in the left breast now is felt to measure 3.8 cm in maximum dimension. The second lesion at about 6 o'clock measures 1.1 cm in maximum dimension.  9. Chest x-ray, 2 view, from 12/02/2012 showed some emphysematous changes, but no acute cardiopulmonary findings. There was a large hiatal hernia.       ASSESSMENT AND PLAN:   Mrs. Figeroa is overall well. She  started on Faslodex (fulvestrant) 500 mg IM today to be followed in approximately 2 weeks, on 12/30/2012, with another 500 mg IM of  Faslodex 500 mg IM onApril 11th. We will check CBC and chemistries.She is to will receive injections every month.Next dose will be on 02/10/13. An appointment with Dr. Arline Asp is to be arranged  on June 6 prior to Faslodex injection, with labs. SHe knows to call us in the interim if she has any questions or  concerns.    Marlowe Kays E, PA-C 01/27/2013, 5:14 PM

## 2013-01-27 NOTE — Patient Instructions (Addendum)
Follow up for Faslodex in 5/9 and with Dr. Arline Asp on June 6 prior to Faslodex injection, with labs

## 2013-01-30 ENCOUNTER — Telehealth: Payer: Self-pay | Admitting: Oncology

## 2013-01-31 ENCOUNTER — Other Ambulatory Visit: Payer: Self-pay | Admitting: Oncology

## 2013-02-09 ENCOUNTER — Telehealth: Payer: Self-pay | Admitting: Oncology

## 2013-02-09 NOTE — Telephone Encounter (Signed)
Returned dtr's call re a message she received re a 5/9 appt and would llike early AM. lmonvm for dtr confirming pt does have an appt for 5/9 @ 3pm. dtr infomred that time has been changed to 10:45 am per her request for an earlier appt. dtr asked to call me back if this time is not good and to get a schedule when pt comes in.

## 2013-02-10 ENCOUNTER — Ambulatory Visit (HOSPITAL_BASED_OUTPATIENT_CLINIC_OR_DEPARTMENT_OTHER): Payer: Medicare Other

## 2013-02-10 VITALS — BP 160/82 | HR 72 | Temp 98.4°F

## 2013-02-10 DIAGNOSIS — Z5111 Encounter for antineoplastic chemotherapy: Secondary | ICD-10-CM

## 2013-02-10 DIAGNOSIS — C50419 Malignant neoplasm of upper-outer quadrant of unspecified female breast: Secondary | ICD-10-CM

## 2013-02-10 DIAGNOSIS — C773 Secondary and unspecified malignant neoplasm of axilla and upper limb lymph nodes: Secondary | ICD-10-CM

## 2013-02-10 DIAGNOSIS — C50912 Malignant neoplasm of unspecified site of left female breast: Secondary | ICD-10-CM

## 2013-02-10 MED ORDER — FULVESTRANT 250 MG/5ML IM SOLN
500.0000 mg | Freq: Once | INTRAMUSCULAR | Status: AC
  Administered 2013-02-10: 500 mg via INTRAMUSCULAR
  Filled 2013-02-10: qty 10

## 2013-03-09 ENCOUNTER — Encounter: Payer: Self-pay | Admitting: Oncology

## 2013-03-09 NOTE — Progress Notes (Signed)
[name] the patient's daughter called and left a message the EPP expired and wanted new one. She said ok to leave her a message on what to do. I called and left a message at [phone] and gave [phone] to call and advise they are seeking financial asst for mom.

## 2013-03-10 ENCOUNTER — Encounter: Payer: Self-pay | Admitting: Oncology

## 2013-03-10 ENCOUNTER — Other Ambulatory Visit (HOSPITAL_BASED_OUTPATIENT_CLINIC_OR_DEPARTMENT_OTHER): Payer: Medicare Other | Admitting: Lab

## 2013-03-10 ENCOUNTER — Ambulatory Visit (HOSPITAL_BASED_OUTPATIENT_CLINIC_OR_DEPARTMENT_OTHER): Payer: Medicare Other | Admitting: Oncology

## 2013-03-10 ENCOUNTER — Ambulatory Visit (HOSPITAL_BASED_OUTPATIENT_CLINIC_OR_DEPARTMENT_OTHER): Payer: Medicare Other

## 2013-03-10 VITALS — BP 196/90 | HR 59 | Temp 97.0°F | Resp 18 | Ht 62.0 in | Wt 122.4 lb

## 2013-03-10 DIAGNOSIS — C773 Secondary and unspecified malignant neoplasm of axilla and upper limb lymph nodes: Secondary | ICD-10-CM

## 2013-03-10 DIAGNOSIS — C50419 Malignant neoplasm of upper-outer quadrant of unspecified female breast: Secondary | ICD-10-CM

## 2013-03-10 DIAGNOSIS — Z170 Estrogen receptor positive status [ER+]: Secondary | ICD-10-CM

## 2013-03-10 DIAGNOSIS — C50112 Malignant neoplasm of central portion of left female breast: Secondary | ICD-10-CM

## 2013-03-10 DIAGNOSIS — Z5111 Encounter for antineoplastic chemotherapy: Secondary | ICD-10-CM

## 2013-03-10 DIAGNOSIS — C50912 Malignant neoplasm of unspecified site of left female breast: Secondary | ICD-10-CM

## 2013-03-10 DIAGNOSIS — D509 Iron deficiency anemia, unspecified: Secondary | ICD-10-CM

## 2013-03-10 DIAGNOSIS — C50319 Malignant neoplasm of lower-inner quadrant of unspecified female breast: Secondary | ICD-10-CM

## 2013-03-10 DIAGNOSIS — D539 Nutritional anemia, unspecified: Secondary | ICD-10-CM

## 2013-03-10 LAB — LACTATE DEHYDROGENASE (CC13): LDH: 133 U/L (ref 125–245)

## 2013-03-10 LAB — CBC WITH DIFFERENTIAL/PLATELET
BASO%: 0.3 % (ref 0.0–2.0)
Eosinophils Absolute: 0.1 10*3/uL (ref 0.0–0.5)
LYMPH%: 30.9 % (ref 14.0–49.7)
MCHC: 34.2 g/dL (ref 31.5–36.0)
MONO#: 0.5 10*3/uL (ref 0.1–0.9)
MONO%: 8.9 % (ref 0.0–14.0)
NEUT#: 3 10*3/uL (ref 1.5–6.5)
Platelets: 216 10*3/uL (ref 145–400)
RBC: 3.82 10*6/uL (ref 3.70–5.45)
RDW: 13.1 % (ref 11.2–14.5)
WBC: 5.3 10*3/uL (ref 3.9–10.3)

## 2013-03-10 LAB — COMPREHENSIVE METABOLIC PANEL (CC13)
ALT: 11 U/L (ref 0–55)
AST: 17 U/L (ref 5–34)
CO2: 26 mEq/L (ref 22–29)
Chloride: 105 mEq/L (ref 98–107)
Creatinine: 0.8 mg/dL (ref 0.6–1.1)
Sodium: 139 mEq/L (ref 136–145)
Total Bilirubin: 0.34 mg/dL (ref 0.20–1.20)
Total Protein: 6.4 g/dL (ref 6.4–8.3)

## 2013-03-10 MED ORDER — FULVESTRANT 250 MG/5ML IM SOLN
500.0000 mg | Freq: Once | INTRAMUSCULAR | Status: AC
  Administered 2013-03-10: 500 mg via INTRAMUSCULAR
  Filled 2013-03-10: qty 10

## 2013-03-10 NOTE — Progress Notes (Signed)
This office note has been dictated.  #[id number]

## 2013-03-11 NOTE — Progress Notes (Signed)
CC:   Barbara Fee, MD Barbara Levins, MD [name]. [name], M.D.  PROBLEM LIST:  1. Locally advanced cancer of the left breast diagnosed with biopsy  on 01/02/2009 although changes of breast cancer were apparently  evident in November 2009. There was clinical lymph node  involvement. Tumor stage was T4b N1 M0, stage IIIB. In retrospect,  the PET scan from 01/04/2009 suggests the possibility of small pulmonary metastases which did not show any FDG activity. Estrogen receptor was 100%, progesterone receptor 99%. Proliferative index was 45%. HER-2/neu was negative. The patient declined surgery, radiation and chemotherapy. Femara was started on Feb 02, 2009, and continued through 12/14/2012. The patient had an excellent response to Femara; however, in November 2013 there was suggestion of slow progression involving the left breast. The patient started Faslodex 500 mg on 12/14/2012.  2. Cystic masses involving the right breast with negative biopsy on  01/02/2009.  3. Iron-deficiency anemia requiring hospitalization, 4 units of blood  and intravenous iron. The patient had positive stools but a  negative colonoscopy on 02/28/2009. Feraheme 510 mg IV was given on  11/06/2011, 11/13/2011, 08/12/2012 and 08/19/2012.  4. Hypertension.  5. Hiatal hernia.  6. Cholelithiasis.  7. History of nonsustained ventricular tachycardia. 8. Dermatitis primarily involving hands possibly due to lisinopril     with reactivation due to sun exposure. 9. Bibasilar rales.   MEDICATIONS:  Reviewed and recorded. Current Outpatient Prescriptions  Medication Sig Dispense Refill  . aspirin 81 MG tablet Take 81 mg by mouth daily.       . Cholecalciferol 5000 UNITS TABS Take 5,000 Units by mouth daily.      . Ferrous Sulfate (IRON) 325 (65 FE) MG TABS Take 325 mg by mouth daily.      . metoprolol (LOPRESSOR) 50 MG tablet Take 1 tablet (50 mg total) by mouth 2 (two) times daily.  180 tablet  3  . Multiple Vitamin  (MULTIVITAMIN) tablet Take 1 tablet by mouth daily.      Marland Kitchen senna (SENOKOT) 8.6 MG tablet Take 1 tablet by mouth as needed.      . vitamin B-12 (CYANOCOBALAMIN) 100 MCG tablet Take 50 mcg by mouth daily.       No current facility-administered medications for this visit.     TREATMENT PROGRAM:  Faslodex 500 mg IM started on 12/14/2012 with initial loading dose and now monthly IM injections.  SMOKING HISTORY: The patient has never smoked cigarettes.    HISTORY:  [name] was seen today for followup of her locally advanced cancer involving the left breast with diagnosis going back to March 2010.  The patient may have had small pulmonary metastases at the time of diagnosis.  She may also have a small focus of cancer in the right breast as per recent imaging studies.  [name] was last seen by [place] on 01/27/2013 and prior to that on 12/14/2012.  She is accompanied today by her daughter, [name].  [name] continues to feel entirely well, is asymptomatic, lives alone and is totally independent and active around her home.  She enjoys raising chickens and retrieving their eggs.  She has been coming in for her monthly Faslodex injections, most recently 02/10/2013 and 01/13/2013. She denies any problems relating to her breast, specifically any pain or bleeding.  She feels entirely well.  Her biggest problem actually is dermatitis involving her hands, particularly when there is some sun exposure.  She and her daughter think this may be a  reactivation of the skin reaction she had apparently to lisinopril almost a year ago.  She denies any evidence of blood in her stools or melena.  PHYSICAL EXAMINATION:  [name] is a lively attractive 79 year old woman weighing 122 pounds 6.4 ounces, height 5 feet 2 inches, body surface area 1.56 sq m.  Blood pressure today 196/90.  Once again, the patient's elevated blood pressure was brought to her attention.  The rest of the vital signs are normal.   There is no scleral icterus.  Mouth and pharynx are benign.  No peripheral adenopathy palpable in the neck.  Lungs reveal some bibasilar rales.  Cardiac exam regular rhythm without murmur or rub.  Examination of the left breast discloses continued distortion with retraction of the nipple areolar complex.  There is a denuded area with crust just to the left of where the nipple areolar complex would be, measuring about 2 cm.  There is some induration superiorly to the location of the nipple areolar complex.  Skin is otherwise intact.  The previously seen tumor nodules on the skin have regressed.  I do not see any major changes with regard to the left breast.  In fact, there may be some improvement.  In the left axilla however I am able to feel a fairly hard, fairly discrete 2 cm lymph node in the high axillary region. There may be some other lymphadenopathy.  The right breast is completely benign.  The nipple areolar complex is flat.  No axillary adenopathy and no palpable tumor or dimpling.  Abdomen is benign with no organomegaly or masses palpable.  Extremities:  Puffy but no pitting edema.  The patient has some erythematous depigmented areas over her hands bilaterally.  She says that this itches.  She has seen a dermatologist in the past and has had a biopsy which suggested a drug reaction. No lymphedema involving either arm. Neurologic exam is normal.   LABORATORY DATA:  Today, white count 5.3, ANC 3.0, hemoglobin 12.7, hematocrit 37.0, platelets 216,000.  Chemistries today were essentially normal.  Albumin 3.5, LDH 133, BUN 17, creatinine 0.8, calcium 9.2. Tumor markers back on 08/26/2012 were normal.  Specifically CA27.29 was 16, CEA was 2.8.  On 12/14/2012 ferritin was 433, iron saturation 34% and folate 13.8.  Vitamin D level on 08/05/2012 was 52.  Vitamin B12 level on 12/05/2010 was 643.  IMAGING STUDIES:  1. Chest x-ray on 06/27/2010 showed no acute cardiopulmonary process.   2. Digital diagnostic bilateral mammograms on 07/04/2010 showed the  masses in both breasts.  3. Digital diagnostic bilateral mammograms carried out on 07/31/2011  showed a dense spiculated mass containing central biopsy clip  artifact in the slightly upper outer left breast with associated  nipple and skin retraction without significant change since the  most recent prior study of 07/04/2010. On mammogram, this mass  measures approximately 2.7 x 2.5 x 2.9 cm. No new mass is  identified in the left breast.  On the right side, the circumscribed more superiorly and  posteriorly positioned mass has decreased in size since 07/04/2010.  On the mammogram, it currently measures 3.2 x 3.2 x 2.8 cm, whereas  previously it measured 4.0 x 3.8 x 3.4 cm. The more irregular mass  in the upper central right breast just anterior to the  circumscribed mass is without significant change. It measured  approximately 2.9 cm in greatest dimension and is felt to be  stable. No new masses identified in the right breast.  4. Chest x-ray,  2 view, from 08/05/2012 showed a large hiatal hernia, but  no acute cardiopulmonary disease.  5. Digital diagnostic bilateral mammogram and bilateral breast ultrasound were  carried out on 08/12/2012. These studies were compared with the study done from 07/31/2011. There was suggestion of progression. A mass in the left breast that measured 3.3 cm on 11/08 had previously measured 2.6 cm by mammogram. There was another mass seen in the lower central aspect of the left breast  measuring 1.1 cm whereas previously on 07/31/2011 it measured 0.6 cm.  Ultrasound was carried out on 08/12/2012. The dominant mass in the left  breast measured 2.5 x 2.4 x 3.0 cm by ultrasound. The smaller lesion by  ultrasound measured 1.1 x 0.7 x 0.9 cm. I do not believe we had an  ultrasound last year for comparison. A lymph node was seen in the left  axilla measuring 1.1 x 0.9 x 1.4 cm. There was  obliteration of the  fatty hilum. There were also by ultrasound masses in the right breast.  A mass in the superior aspect of the right breast measured 2.4 x 2.6 x  0.6 cm. This mass was located 3 cm from the nipple. A second mass  which was located 1 cm from the nipple measured 1.0 x 0.8 x 1.1 cm. It  was felt that the known biopsy proven malignancy in the left breast was  progressing. The masses in the right breast were decreasing.  6. Mammogram and ultrasound of the left breast from 08/12/2012 suggested  progression compared with the prior mammogram of 07/31/2011. The mass  which is seen in about the 3 o'clock position now measures about 3.3 cm  as compared with 2.6 cm previously. There was another mass seen at  around the 6 o'clock position that measures 1.1 cm, whereas previously  it measured 0.6 cm. There was also felt to be a lymph node in the left  axilla measuring about 1.4 cm in maximum dimension. There was some  obliteration of the fatty hilum suggesting that this may, in fact, be  replaced by cancer. Ultrasound exam of the right breast showed a mass  in the superior aspect measuring 2.4 x 2.6 x 0.6 cm. The mass is  located about 3 cm from the nipple. There was a second mass located  about 1 cm from the nipple and this measured about 1.1 cm. The masses  in the right breast have decreased compared with prior studies, whereas  the malignancy in the left breast seems to be progressing.  7. PET scan carried out on 09/09/2012 was compared with the prior PET scan  from 01/04/2009. Once again there are 2 lesions seen in the left  breast. The larger lesion is located at about the 3 o'clock position.  There is a smaller lesion at about 6 o'clock which may be new, as well  as a left axillary lymph node. There was previously a dumbbell-shaped  lesion in the right breast which was quite large. There is now a small  residual focus corresponding to the upper part of the previous dumbbell   lesion with some increased activity which raises concern that this  residual lesion may be malignant. In reviewing the original PET scan  from 01/04/2009, it appears that the patient may have had some small  pulmonary metastases which had regressed on treatment with Femara. Some  of the lesions are still present on the most recent PET scan from  09/09/2012, but clearly are smaller.  8.  Digital diagnostic mammogram of the left breast on 12/02/2012 shows  progression compared with the prior study from 08/12/2012. The larger  lesion at about 3 o'clock in the left breast now is felt to measure 3.8  cm in maximum dimension. The second lesion at about 6 o'clock measures  1.1 cm in maximum dimension.  9. Chest x-ray, 2 view, from 12/02/2012 showed some emphysematous changes,  but no acute cardiopulmonary findings. There was a large hiatal hernia.    IMPRESSION AND PLAN:  [name] is doing quite well and seems to have had some improvement regarding her left breast.  She is no longer having any bleeding.  I am concerned about what may be progressive lymphadenopathy in the left axilla.  Clinically she is doing superbly well.  Today she will receive another dose of Faslodex 500 mg IM.  We will continue with present management.  As stated, [name] is completely asymptomatic.  I should mention that she did see Dr. Lonie Peak for a radiation oncology consultation on 12/30/2012.  Dr. Basilio Cairo was not in favor of palliative radiation; instead recommended a mastectomy if the patient should need that.  I should mention that the patient is not at all enthusiastic about having a mastectomy.  Hopefully that will not be necessary.  As stated, the patient did receive Faslodex today 500 mg IM.  She will return monthly for injections.  She has an appointment to return in 3 months which should be around September 5, at which time she will have CBC, chemistries, iron studies, CEA and CA27.29.  It should  be noted that she had a mammogram and a chest x-ray on 12/02/2012.  At some point, she will need reassessment certainly of her left breast and possibly a repeat chest x-ray.  The patient has also had an MRI in the past and a PET scan carried out on 09/09/2012 that also showed lesions in both breasts.    ______________________________ Samul Dada, M.D. DSM/MEDQ  D:  03/10/2013  T:  03/11/2013  Job:  [id number]

## 2013-03-13 ENCOUNTER — Telehealth: Payer: Self-pay | Admitting: *Deleted

## 2013-03-13 NOTE — Telephone Encounter (Signed)
Lm informing the pt that DSM would like for her to have  inj once a month and to see him back in 3 months. gv appts for her inj on 7/3@3pm , 8/1@3 :45pm, and 9/5 @ 9:30am labs, ov @10am , and inj@11 :15am on 9/5. i made pt aware that i will mail a letter/AVS as well...td

## 2013-04-06 ENCOUNTER — Ambulatory Visit: Payer: Medicare Other

## 2013-04-06 ENCOUNTER — Ambulatory Visit (HOSPITAL_BASED_OUTPATIENT_CLINIC_OR_DEPARTMENT_OTHER): Payer: Medicare Other

## 2013-04-06 VITALS — BP 170/80 | HR 62 | Temp 98.1°F

## 2013-04-06 DIAGNOSIS — C50912 Malignant neoplasm of unspecified site of left female breast: Secondary | ICD-10-CM

## 2013-04-06 DIAGNOSIS — C50119 Malignant neoplasm of central portion of unspecified female breast: Secondary | ICD-10-CM

## 2013-04-06 DIAGNOSIS — Z5111 Encounter for antineoplastic chemotherapy: Secondary | ICD-10-CM

## 2013-04-06 MED ORDER — FULVESTRANT 250 MG/5ML IM SOLN
500.0000 mg | Freq: Once | INTRAMUSCULAR | Status: AC
Start: 2013-04-06 — End: 2013-04-06
  Administered 2013-04-06: 500 mg via INTRAMUSCULAR
  Filled 2013-04-06: qty 10

## 2013-05-05 ENCOUNTER — Ambulatory Visit: Payer: Medicare Other

## 2013-05-05 VITALS — BP 164/92 | HR 54 | Temp 97.5°F

## 2013-05-05 DIAGNOSIS — C50912 Malignant neoplasm of unspecified site of left female breast: Secondary | ICD-10-CM

## 2013-05-05 MED ORDER — FULVESTRANT 250 MG/5ML IM SOLN
500.0000 mg | Freq: Once | INTRAMUSCULAR | Status: DC
  Filled 2013-05-05: qty 10

## 2013-05-05 NOTE — Patient Instructions (Signed)
Fulvestrant injection What is this medicine? FULVESTRANT (ful VES trant) blocks the effects of estrogen. It is used to treat breast cancer in women past the age of menopause. This medicine may be used for other purposes; ask your health care provider or pharmacist if you have questions. What should I tell my health care provider before I take this medicine? They need to know if you have any of these conditions: -bleeding problems -liver disease -low levels of platelets in the blood -an unusual or allergic reaction to fulvestrant, other medicines, foods, dyes, or preservatives -pregnant or trying to get pregnant -breast-feeding How should I use this medicine? This medicine is for injection into a muscle. It is usually given by a health care professional in a hospital or clinic setting. Talk to your pediatrician regarding the use of this medicine in children. Special care may be needed. Overdosage: If you think you have taken too much of this medicine contact a poison control center or emergency room at once. NOTE: This medicine is only for you. Do not share this medicine with others. What if I miss a dose? It is important not to miss your dose. Call your doctor or health care professional if you are unable to keep an appointment. What may interact with this medicine? -medicines that treat or prevent blood clots like warfarin, enoxaparin, and dalteparin This list may not describe all possible interactions. Give your health care provider a list of all the medicines, herbs, non-prescription drugs, or dietary supplements you use. Also tell them if you smoke, drink alcohol, or use illegal drugs. Some items may interact with your medicine. What should I watch for while using this medicine? Your condition will be monitored carefully while you are receiving this medicine. You will need important blood work done while you are taking this medicine. Do not become pregnant while taking this medicine.  Women should inform their doctor if they wish to become pregnant or think they might be pregnant. There is a potential for serious side effects to an unborn child. Talk to your health care professional or pharmacist for more information. What side effects may I notice from receiving this medicine? Side effects that you should report to your doctor or health care professional as soon as possible: -allergic reactions like skin rash, itching or hives, swelling of the face, lips, or tongue -feeling faint or lightheaded, falls -fever or flu-like symptoms -sore throat -vaginal bleeding Side effects that usually do not require medical attention (report to your doctor or health care professional if they continue or are bothersome): -aches, pains -constipation or diarrhea -headache -hot flashes -nausea, vomiting -pain at site where injected -stomach pain This list may not describe all possible side effects. Call your doctor for medical advice about side effects. You may report side effects to FDA at 1-800-FDA-1088. Where should I keep my medicine? This drug is given in a hospital or clinic and will not be stored at home. NOTE: This sheet is a summary. It may not cover all possible information. If you have questions about this medicine, talk to your doctor, pharmacist, or health care provider.  2013, [employer]. (01/30/2008 3:39:24 PM)  

## 2013-06-09 ENCOUNTER — Ambulatory Visit (HOSPITAL_BASED_OUTPATIENT_CLINIC_OR_DEPARTMENT_OTHER): Payer: Medicare Other

## 2013-06-09 ENCOUNTER — Ambulatory Visit (HOSPITAL_BASED_OUTPATIENT_CLINIC_OR_DEPARTMENT_OTHER): Payer: Medicare Other | Admitting: Internal Medicine

## 2013-06-09 ENCOUNTER — Telehealth: Payer: Self-pay | Admitting: Internal Medicine

## 2013-06-09 ENCOUNTER — Other Ambulatory Visit (HOSPITAL_BASED_OUTPATIENT_CLINIC_OR_DEPARTMENT_OTHER): Payer: Medicare Other | Admitting: Lab

## 2013-06-09 ENCOUNTER — Encounter: Payer: Self-pay | Admitting: Internal Medicine

## 2013-06-09 VITALS — BP 160/80 | HR 62 | Temp 97.3°F

## 2013-06-09 DIAGNOSIS — Z5111 Encounter for antineoplastic chemotherapy: Secondary | ICD-10-CM

## 2013-06-09 DIAGNOSIS — D509 Iron deficiency anemia, unspecified: Secondary | ICD-10-CM

## 2013-06-09 DIAGNOSIS — C773 Secondary and unspecified malignant neoplasm of axilla and upper limb lymph nodes: Secondary | ICD-10-CM

## 2013-06-09 DIAGNOSIS — C50419 Malignant neoplasm of upper-outer quadrant of unspecified female breast: Secondary | ICD-10-CM

## 2013-06-09 DIAGNOSIS — C50912 Malignant neoplasm of unspecified site of left female breast: Secondary | ICD-10-CM

## 2013-06-09 LAB — CBC WITH DIFFERENTIAL/PLATELET
Basophils Absolute: 0 10*3/uL (ref 0.0–0.1)
Eosinophils Absolute: 0.1 10*3/uL (ref 0.0–0.5)
HCT: 37.6 % (ref 34.8–46.6)
HGB: 12.7 g/dL (ref 11.6–15.9)
MCH: 31.8 pg (ref 25.1–34.0)
NEUT#: 3 10*3/uL (ref 1.5–6.5)
NEUT%: 60.2 % (ref 38.4–76.8)
RDW: 13.1 % (ref 11.2–14.5)
lymph#: 1.5 10*3/uL (ref 0.9–3.3)

## 2013-06-09 LAB — COMPREHENSIVE METABOLIC PANEL (CC13)
ALT: 12 U/L (ref 0–55)
AST: 18 U/L (ref 5–34)
Alkaline Phosphatase: 81 U/L (ref 40–150)
Potassium: 4 mEq/L (ref 3.5–5.1)
Sodium: 140 mEq/L (ref 136–145)
Total Bilirubin: 0.29 mg/dL (ref 0.20–1.20)
Total Protein: 6.3 g/dL — ABNORMAL LOW (ref 6.4–8.3)

## 2013-06-09 LAB — LACTATE DEHYDROGENASE (CC13): LDH: 143 U/L (ref 125–245)

## 2013-06-09 LAB — IRON AND TIBC CHCC
%SAT: 28 % (ref 21–57)
Iron: 83 ug/dL (ref 41–142)

## 2013-06-09 MED ORDER — FULVESTRANT 250 MG/5ML IM SOLN
500.0000 mg | Freq: Once | INTRAMUSCULAR | Status: AC
  Administered 2013-06-09: 500 mg via INTRAMUSCULAR
  Filled 2013-06-09: qty 10

## 2013-06-09 NOTE — Progress Notes (Signed)
Hematology and Oncology Follow Up Visit  Naylani Bradner [id number] September 21, 1922 79 y.o. 06/11/2013 9:33 AM Oliver Barre, MDJohn, Len Blalock, MD   Principle Diagnosis: Locally advanced cancer of the left breast diagnosed with biopsy on 01/02/2009 although changes of breast cancer were apparently evident in November 2009. There was clinical lymph node involvement. Tumor stage was T4b N1 M0, stage IIIB.  Estrogen receptor was 100%, progesterone receptor 99%. Proliferative index was 45%. HER-2/neu was negative. The patient declined surgery, radiation and chemotherapy.  Prior Therapy: Femara was started on Feb 02, 2009, and continued through 12/14/2012. The patient had an excellent response to Femara; however, in November 2013 there was suggestion of slow progression involving the left breast.   Current therapy:  Based on progression, Faslodex 500 mg IM started on 12/14/2012 with initial loading dose and now monthly IM injections.  Interim History: Shaneeka Scarboro was seen today for followup of her locally advanced cancer involving the left breast with diagnosis going back to March 2010.  The patient may have had small pulmonary metastases at the time of diagnosis.  She may also have a small focus of cancer in the right breast as per recent imaging studies.  She was last seen by Dr. Arline Asp on 03/10/13. Today, she is accompanied today by her daughter, [name].  She continues to feel entirely well, is asymptomatic, lives alone and is totally independent and active around her home.  She enjoys raising chickens and retrieving their eggs.  She has been coming in for her monthly Faslodex injections. She denies any problems relating to her breast, specifically any pain or bleeding.  She feels entirely well.    SMOKING HISTORY: The patient has never smoked cigarettes.  Medications: I have reviewed the patient's current medications.  Current Outpatient Prescriptions  Medication Sig Dispense Refill  . aspirin 81 MG tablet  Take 81 mg by mouth daily.       . Cholecalciferol 5000 UNITS TABS Take 5,000 Units by mouth daily.      . Ferrous Sulfate (IRON) 325 (65 FE) MG TABS Take 325 mg by mouth daily.      . metoprolol (LOPRESSOR) 50 MG tablet Take 1 tablet (50 mg total) by mouth 2 (two) times daily.  180 tablet  3  . Multiple Vitamin (MULTIVITAMIN) tablet Take 1 tablet by mouth daily.      Marland Kitchen senna (SENOKOT) 8.6 MG tablet Take 1 tablet by mouth as needed.      . vitamin B-12 (CYANOCOBALAMIN) 100 MCG tablet Take 50 mcg by mouth daily.       No current facility-administered medications for this visit.     Allergies:  Allergies  Allergen Reactions  . Avapro [Irbesartan] Rash  . Ace Inhibitors Itching  . Amlodipine Nausea Only and Rash    Leg cramps  . Lisinopril Rash   Hematology/Oncology Problem List: 1. Cystic masses involving the right breast with negative biopsy on 01/02/2009.  2. Iron-deficiency anemia requiring hospitalization, 4 units of blood and intravenous iron. The patient had positive stools but a negative colonoscopy on 02/28/2009. Feraheme 510 mg IV was given on 11/06/2011, 11/13/2011, 08/12/2012 and 08/19/2012.   Past Medical History, Surgical history, Social history, and Family History were reviewed and updated.  Review of Systems: Constitutional:  Negative for fever, chills, night sweats, anorexia, weight loss, pain. Cardiovascular: no chest pain or dyspnea on exertion Respiratory: no cough, shortness of breath, or wheezing Neurological: no TIA or stroke symptoms Dermatological: negative for lumps and skin lesion changes  ENT: negative for - epistaxis Skin: Negative. Gastrointestinal: no abdominal pain, change in bowel habits, or black or bloody stools Genito-Urinary: no dysuria, trouble voiding, or hematuria Hematological and Lymphatic: negative for - bleeding problems or bruising Breast: negative for breast lumps Musculoskeletal: negative for - gait disturbance Remaining ROS  negative. Physical Exam: Blood pressure 160/80, pulse 62, temperature 97.3 F (36.3 C), temperature source Oral. ECOG: 1 General appearance: alert, cooperative, appears stated age and no distress Head: Normocephalic, without obvious abnormality, atraumatic Neck: no adenopathy, supple, symmetrical, trachea midline and thyroid not enlarged, symmetric, no tenderness/mass/nodules Lymph nodes: Cervical adenopathy: None appreciated and Supraclavicular adenopathy: None appreciated Heart:regular rate and rhythm, S1, S2 normal, no murmur, click, rub or gallop Lung:chest clear, no wheezing, rales, normal symmetric air entry, Heart exam - S1, S2 normal, no murmur, no gallop, rate regular Breast: Left breast reveals continued distortion with retraction of the nipple areolar complex.  There continues to be denuded area with crust just tot he left of where the nipple areolar complex would be, measuring 2 cm.  There is some induration superiorly to the location of the nipple areolar complex.  Left axilla with some hard axillary lymph node less than 2 cm.  The right breast is benign without masses.  No adenopathy or palpable lump or dimpling.  Abdomin: soft, non-tender, without masses or organomegaly EXT:No peripheral edema Neuro: No focal deficits   Lab Results: Lab Results  Component Value Date   WBC 5.0 06/09/2013   HGB 12.7 06/09/2013   HCT 37.6 06/09/2013   MCV 94.3 06/09/2013   PLT 205 06/09/2013     Chemistry      Component Value Date/Time   NA 140 06/09/2013 0948   NA 139 03/18/2012 1339   K 4.0 06/09/2013 0948   K 4.3 03/18/2012 1339   CL 105 03/10/2013 1536   CL 105 03/18/2012 1339   CO2 26 06/09/2013 0948   CO2 27 03/18/2012 1339   BUN 10.9 06/09/2013 0948   BUN 13 03/18/2012 1339   CREATININE 0.7 06/09/2013 0948   CREATININE 0.78 03/18/2012 1339      Component Value Date/Time   CALCIUM 8.7 06/09/2013 0948   CALCIUM 9.1 03/18/2012 1339   ALKPHOS 81 06/09/2013 0948   ALKPHOS 96 03/18/2012 1339   AST 18  06/09/2013 0948   AST 18 03/18/2012 1339   ALT 12 06/09/2013 0948   ALT 16 03/18/2012 1339   BILITOT 0.29 06/09/2013 0948   BILITOT 0.3 03/18/2012 1339     CEA 3.1, CA 27-29 16.1  Radiological Studies: 1. Chest x-ray on 06/27/2010 showed no acute cardiopulmonary process.  2. Digital diagnostic bilateral mammograms on 07/04/2010 showed the masses in both breasts.  3. Digital diagnostic bilateral mammograms carried out on 07/31/2011 showed a dense spiculated mass containing central biopsy clip  artifact in the slightly upper outer left breast with associated nipple and skin retraction without significant change since the most recent prior study of 07/04/2010. On mammogram, this mass measures approximately 2.7 x 2.5 x 2.9 cm. No new mass is identified in the left breast. On the right side, the circumscribed more superiorly and posteriorly positioned mass has decreased in size since 07/04/2010. On the mammogram, it currently measures 3.2 x 3.2 x 2.8 cm, whereas previously it measured 4.0 x 3.8 x 3.4 cm. The more irregular mass in the upper central right breast just anterior to the  circumscribed mass is without significant change. It measured approximately 2.9 cm in greatest dimension and  is felt to be stable. No new masses identified in the right breast.  4. Chest x-ray, 2 view, from 08/05/2012 showed a large hiatal hernia, but no acute cardiopulmonary disease.  5. Digital diagnostic bilateral mammogram and bilateral breast ultrasound were carried out on 08/12/2012. These studies were compared with the study done from 07/31/2011. There was suggestion of progression. A mass in the left breast that measured 3.3 cm on 11/08 had previously measured 2.6 cm by mammogram. There was another mass seen in the lower central aspect of the left breast measuring 1.1 cm whereas previously on 07/31/2011 it measured 0.6 cm. Ultrasound was carried out on 08/12/2012. The dominant mass in the left breast measured 2.5 x 2.4 x 3.0  cm by ultrasound. The smaller lesion by ultrasound measured 1.1 x 0.7 x 0.9 cm. I do not believe we had an ultrasound last year for comparison. A lymph node was seen in the left axilla measuring 1.1 x 0.9 x 1.4 cm. There was obliteration of the fatty hilum. There were also by ultrasound masses in the right breast. A mass in the superior aspect of the right breast measured 2.4 x 2.6 x 0.6 cm. This mass was located 3 cm from the nipple. A second mass which was located 1 cm from the nipple measured 1.0 x 0.8 x 1.1 cm. It was felt that the known biopsy proven malignancy in the left breast was  progressing. The masses in the right breast were decreasing.  6. Mammogram and ultrasound of the left breast from 08/12/2012 suggested progression compared with the prior mammogram of 07/31/2011. The mass which is seen in about the 3 o'clock position now measures about 3.3 cm as compared with 2.6 cm previously. There was another mass seen at  around the 6 o'clock position that measures 1.1 cm, whereas previously it measured 0.6 cm. There was also felt to be a lymph node in the left  axilla measuring about 1.4 cm in maximum dimension. There was some obliteration of the fatty hilum suggesting that this may, in fact, be  replaced by cancer. Ultrasound exam of the right breast showed a mass in the superior aspect measuring 2.4 x 2.6 x 0.6 cm. The mass is  located about 3 cm from the nipple. There was a second mass located about 1 cm from the nipple and this measured about 1.1 cm. The masses  in the right breast have decreased compared with prior studies, whereas the malignancy in the left breast seems to be progressing.  7. PET scan carried out on 09/09/2012 was compared with the prior PET scan from 01/04/2009. Once again there are 2 lesions seen in the left  breast. The larger lesion is located at about the 3 o'clock position. There is a smaller lesion at about 6 o'clock which may be new, as well  as a left axillary lymph  node. There was previously a dumbbell-shaped lesion in the right breast which was quite large. There is now a small  residual focus corresponding to the upper part of the previous dumbbell lesion with some increased activity which raises concern that this  residual lesion may be malignant. In reviewing the original PET scan from 01/04/2009, it appears that the patient may have had some small  pulmonary metastases which had regressed on treatment with Femara. Some of the lesions are still present on the most recent PET scan from  09/09/2012, but clearly are smaller.  8. Digital diagnostic mammogram of the left breast on 12/02/2012 shows  progression compared with the prior study from 08/12/2012. The larger  lesion at about 3 o'clock in the left breast now is felt to measure 3.8 cm in maximum dimension. The second lesion at about 6 o'clock measures  1.1 cm in maximum dimension.  9. Chest x-ray, 2 view, from 12/02/2012 showed some emphysematous changes, but no acute cardiopulmonary findings. There was a large hiatal hernia.  Impression and Plan: 1. Left breast cancer.  - Ms. Bigford is doing quite well and seems to have had some improvement regarding her left breast.  She is no longer having any Bleeding but she does have lymphadenopathy in the left axilla.  Clinically she is doing superbly well. -Today she will receive another dose of Faslodex 500 mg IM.  We will continue with present management.  As stated, Ms. Cure is completely asymptomatic.  On prior visits, she did see Dr. Lonie Peak for a radiation oncology consultation on 12/30/2012.  Dr. Basilio Cairo was not in favor of palliative radiation; instead recommended a mastectomy if the patient should need that.  Patient is not at all enthusiastic about having a mastectomy.  Hopefully that will not benecessary. - Her tumor markers are stable.  - She will return monthly for injections.  She has an appointment to return in 3 months and she will have CBC,  chemistries, iron studies, CEA and CA27.29.  2. Iron-deficiency Anemia. - Her hemoglobin today is stable.   Spent more than half the time coordinating care.    Chistian Kasler, MD 9/7/20149:33 AM

## 2013-06-09 NOTE — Telephone Encounter (Signed)
s.w. pt and advised on mthly appts from Oct thru Dec...ok and aware

## 2013-06-09 NOTE — Patient Instructions (Addendum)
Fulvestrant injection What is this medicine? FULVESTRANT (ful VES trant) blocks the effects of estrogen. It is used to treat breast cancer in women past the age of menopause. This medicine may be used for other purposes; ask your health care provider or pharmacist if you have questions. What should I tell my health care provider before I take this medicine? They need to know if you have any of these conditions: -bleeding problems -liver disease -low levels of platelets in the blood -an unusual or allergic reaction to fulvestrant, other medicines, foods, dyes, or preservatives -pregnant or trying to get pregnant -breast-feeding How should I use this medicine? This medicine is for injection into a muscle. It is usually given by a health care professional in a hospital or clinic setting. Talk to your pediatrician regarding the use of this medicine in children. Special care may be needed. Overdosage: If you think you have taken too much of this medicine contact a poison control center or emergency room at once. NOTE: This medicine is only for you. Do not share this medicine with others. What if I miss a dose? It is important not to miss your dose. Call your doctor or health care professional if you are unable to keep an appointment. What may interact with this medicine? -medicines that treat or prevent blood clots like warfarin, enoxaparin, and dalteparin This list may not describe all possible interactions. Give your health care provider a list of all the medicines, herbs, non-prescription drugs, or dietary supplements you use. Also tell them if you smoke, drink alcohol, or use illegal drugs. Some items may interact with your medicine. What should I watch for while using this medicine? Your condition will be monitored carefully while you are receiving this medicine. You will need important blood work done while you are taking this medicine. Do not become pregnant while taking this medicine.  Women should inform their doctor if they wish to become pregnant or think they might be pregnant. There is a potential for serious side effects to an unborn child. Talk to your health care professional or pharmacist for more information. What side effects may I notice from receiving this medicine? Side effects that you should report to your doctor or health care professional as soon as possible: -allergic reactions like skin rash, itching or hives, swelling of the face, lips, or tongue -feeling faint or lightheaded, falls -fever or flu-like symptoms -sore throat -vaginal bleeding Side effects that usually do not require medical attention (report to your doctor or health care professional if they continue or are bothersome): -aches, pains -constipation or diarrhea -headache -hot flashes -nausea, vomiting -pain at site where injected -stomach pain This list may not describe all possible side effects. Call your doctor for medical advice about side effects. You may report side effects to FDA at 1-800-FDA-1088. Where should I keep my medicine? This drug is given in a hospital or clinic and will not be stored at home. NOTE: This sheet is a summary. It may not cover all possible information. If you have questions about this medicine, talk to your doctor, pharmacist, or health care provider.  2013, [employer]. (01/30/2008 3:39:24 PM)  1. RTC in 3 months for physical exam and history and labs 2. Continue monthly fulvestrant (FASLODEX) injection 500 mg

## 2013-06-10 LAB — CEA: CEA: 3.1 ng/mL (ref 0.0–5.0)

## 2013-06-10 LAB — CANCER ANTIGEN 27.29: CA 27.29: 16 U/mL (ref 0–39)

## 2013-06-30 ENCOUNTER — Other Ambulatory Visit: Payer: Self-pay | Admitting: Internal Medicine

## 2013-07-07 ENCOUNTER — Ambulatory Visit (HOSPITAL_BASED_OUTPATIENT_CLINIC_OR_DEPARTMENT_OTHER): Payer: Medicare Other

## 2013-07-07 VITALS — BP 192/78 | HR 76 | Temp 97.5°F

## 2013-07-07 DIAGNOSIS — C50419 Malignant neoplasm of upper-outer quadrant of unspecified female breast: Secondary | ICD-10-CM

## 2013-07-07 DIAGNOSIS — Z5111 Encounter for antineoplastic chemotherapy: Secondary | ICD-10-CM

## 2013-07-07 DIAGNOSIS — C50912 Malignant neoplasm of unspecified site of left female breast: Secondary | ICD-10-CM

## 2013-07-07 MED ORDER — FULVESTRANT 250 MG/5ML IM SOLN
500.0000 mg | Freq: Once | INTRAMUSCULAR | Status: AC
  Administered 2013-07-07: 500 mg via INTRAMUSCULAR
  Filled 2013-07-07: qty 10

## 2013-07-10 ENCOUNTER — Telehealth: Payer: Self-pay

## 2013-07-10 NOTE — Telephone Encounter (Signed)
lvm that disability placard form is signed and ready for pick up or mailing. Asked for call back with instructions.

## 2013-07-27 ENCOUNTER — Telehealth: Payer: Self-pay

## 2013-07-27 NOTE — Telephone Encounter (Signed)
[name] called earlier asking if Dr Rosie Fate can prescribe a blood pressure medication for her mother.The pt is in the process of changing from Dr Oliver Barre to Dr Clelia Croft but will not get into Dr Alver Fisher office until January. Pt is taking avapro bid. [name] does not know strength and also it is prescribed for daily. Dr Rosie Fate said he could not safely prescribe medication for the pt. [name] should call the MD who prescribed the avapro. I lvm for [name] stating such.

## 2013-07-28 ENCOUNTER — Other Ambulatory Visit: Payer: Self-pay | Admitting: Internal Medicine

## 2013-08-04 ENCOUNTER — Ambulatory Visit (HOSPITAL_BASED_OUTPATIENT_CLINIC_OR_DEPARTMENT_OTHER): Payer: Medicare Other

## 2013-08-04 VITALS — BP 184/88 | HR 76 | Temp 98.3°F

## 2013-08-04 DIAGNOSIS — Z5111 Encounter for antineoplastic chemotherapy: Secondary | ICD-10-CM

## 2013-08-04 DIAGNOSIS — C50419 Malignant neoplasm of upper-outer quadrant of unspecified female breast: Secondary | ICD-10-CM

## 2013-08-04 DIAGNOSIS — C50912 Malignant neoplasm of unspecified site of left female breast: Secondary | ICD-10-CM

## 2013-08-04 MED ORDER — FULVESTRANT 250 MG/5ML IM SOLN
500.0000 mg | Freq: Once | INTRAMUSCULAR | Status: AC
  Administered 2013-08-04: 500 mg via INTRAMUSCULAR
  Filled 2013-08-04: qty 10

## 2013-08-11 ENCOUNTER — Telehealth: Payer: Self-pay

## 2013-08-11 NOTE — Telephone Encounter (Signed)
[name] called and asked [place] to fax pt's records to Dr Coolidge Breeze. Pt is seeing him today for the first time. I faxed last OV note and labs per daughter's request.

## 2013-09-01 ENCOUNTER — Ambulatory Visit (HOSPITAL_BASED_OUTPATIENT_CLINIC_OR_DEPARTMENT_OTHER): Payer: Medicare Other

## 2013-09-01 VITALS — BP 156/80 | HR 77 | Temp 98.2°F

## 2013-09-01 DIAGNOSIS — C50912 Malignant neoplasm of unspecified site of left female breast: Secondary | ICD-10-CM

## 2013-09-01 DIAGNOSIS — C50419 Malignant neoplasm of upper-outer quadrant of unspecified female breast: Secondary | ICD-10-CM

## 2013-09-01 DIAGNOSIS — Z5111 Encounter for antineoplastic chemotherapy: Secondary | ICD-10-CM

## 2013-09-01 MED ORDER — FULVESTRANT 250 MG/5ML IM SOLN
500.0000 mg | Freq: Once | INTRAMUSCULAR | Status: AC
  Administered 2013-09-01: 500 mg via INTRAMUSCULAR
  Filled 2013-09-01: qty 10

## 2013-09-01 NOTE — Patient Instructions (Signed)
Fulvestrant injection What is this medicine? FULVESTRANT (ful VES trant) blocks the effects of estrogen. It is used to treat breast cancer in women past the age of menopause. This medicine may be used for other purposes; ask your health care provider or pharmacist if you have questions. COMMON BRAND NAME(S): FASLODEX What should I tell my health care provider before I take this medicine? They need to know if you have any of these conditions: -bleeding problems -liver disease -low levels of platelets in the blood -an unusual or allergic reaction to fulvestrant, other medicines, foods, dyes, or preservatives -pregnant or trying to get pregnant -breast-feeding How should I use this medicine? This medicine is for injection into a muscle. It is usually given by a health care professional in a hospital or clinic setting. Talk to your pediatrician regarding the use of this medicine in children. Special care may be needed. Overdosage: If you think you have taken too much of this medicine contact a poison control center or emergency room at once. NOTE: This medicine is only for you. Do not share this medicine with others. What if I miss a dose? It is important not to miss your dose. Call your doctor or health care professional if you are unable to keep an appointment. What may interact with this medicine? -medicines that treat or prevent blood clots like warfarin, enoxaparin, and dalteparin This list may not describe all possible interactions. Give your health care provider a list of all the medicines, herbs, non-prescription drugs, or dietary supplements you use. Also tell them if you smoke, drink alcohol, or use illegal drugs. Some items may interact with your medicine. What should I watch for while using this medicine? Your condition will be monitored carefully while you are receiving this medicine. You will need important blood work done while you are taking this medicine. Do not become pregnant  while taking this medicine. Women should inform their doctor if they wish to become pregnant or think they might be pregnant. There is a potential for serious side effects to an unborn child. Talk to your health care professional or pharmacist for more information. What side effects may I notice from receiving this medicine? Side effects that you should report to your doctor or health care professional as soon as possible: -allergic reactions like skin rash, itching or hives, swelling of the face, lips, or tongue -feeling faint or lightheaded, falls -fever or flu-like symptoms -sore throat -vaginal bleeding Side effects that usually do not require medical attention (report to your doctor or health care professional if they continue or are bothersome): -aches, pains -constipation or diarrhea -headache -hot flashes -nausea, vomiting -pain at site where injected -stomach pain This list may not describe all possible side effects. Call your doctor for medical advice about side effects. You may report side effects to FDA at 1-800-FDA-1088. Where should I keep my medicine? This drug is given in a hospital or clinic and will not be stored at home. NOTE: This sheet is a summary. It may not cover all possible information. If you have questions about this medicine, talk to your doctor, pharmacist, or health care provider.  2014, [employer] Standard. (2008-01-30 15:39:24)  

## 2013-09-08 ENCOUNTER — Other Ambulatory Visit: Payer: Medicare Other

## 2013-09-08 DIAGNOSIS — D509 Iron deficiency anemia, unspecified: Secondary | ICD-10-CM

## 2013-09-08 DIAGNOSIS — C50912 Malignant neoplasm of unspecified site of left female breast: Secondary | ICD-10-CM

## 2013-09-08 LAB — CBC & DIFF AND RETIC
BASO%: 0.6 % (ref 0.0–2.0)
EOS%: 1.9 % (ref 0.0–7.0)
HGB: 12.1 g/dL (ref 11.6–15.9)
Immature Retic Fract: 7.5 % (ref 1.60–10.00)
MCH: 31.3 pg (ref 25.1–34.0)
MCHC: 33 g/dL (ref 31.5–36.0)
MCV: 95.1 fL (ref 79.5–101.0)
MONO#: 0.5 10*3/uL (ref 0.1–0.9)
MONO%: 9.8 % (ref 0.0–14.0)
NEUT%: 61.8 % (ref 38.4–76.8)
RBC: 3.86 10*6/uL (ref 3.70–5.45)
RDW: 13.2 % (ref 11.2–14.5)
Retic %: 1.72 % (ref 0.70–2.10)
Retic Ct Abs: 66.39 10*3/uL (ref 33.70–90.70)
lymph#: 1.3 10*3/uL (ref 0.9–3.3)
nRBC: 0 % (ref 0–0)

## 2013-09-08 LAB — COMPREHENSIVE METABOLIC PANEL (CC13)
ALT: 14 U/L (ref 0–55)
Anion Gap: 8 mEq/L (ref 3–11)
BUN: 10.7 mg/dL (ref 7.0–26.0)
CO2: 27 mEq/L (ref 22–29)
Calcium: 9.1 mg/dL (ref 8.4–10.4)
Chloride: 104 mEq/L (ref 98–109)
Creatinine: 0.7 mg/dL (ref 0.6–1.1)
Glucose: 112 mg/dl (ref 70–140)
Sodium: 139 mEq/L (ref 136–145)
Total Bilirubin: 0.3 mg/dL (ref 0.20–1.20)

## 2013-09-08 LAB — FERRITIN CHCC: Ferritin: 127 ng/ml (ref 9–269)

## 2013-09-09 LAB — CANCER ANTIGEN 27.29: CA 27.29: 21 U/mL (ref 0–39)

## 2013-09-15 ENCOUNTER — Telehealth: Payer: Self-pay | Admitting: Internal Medicine

## 2013-09-15 ENCOUNTER — Ambulatory Visit (HOSPITAL_BASED_OUTPATIENT_CLINIC_OR_DEPARTMENT_OTHER): Payer: Medicare Other | Admitting: Internal Medicine

## 2013-09-15 VITALS — BP 174/74 | HR 78 | Temp 98.3°F | Resp 19 | Ht 62.0 in | Wt 122.3 lb

## 2013-09-15 DIAGNOSIS — Z170 Estrogen receptor positive status [ER+]: Secondary | ICD-10-CM

## 2013-09-15 DIAGNOSIS — C50912 Malignant neoplasm of unspecified site of left female breast: Secondary | ICD-10-CM

## 2013-09-15 DIAGNOSIS — C50919 Malignant neoplasm of unspecified site of unspecified female breast: Secondary | ICD-10-CM

## 2013-09-15 DIAGNOSIS — D509 Iron deficiency anemia, unspecified: Secondary | ICD-10-CM

## 2013-09-15 NOTE — Patient Instructions (Signed)
Fulvestrant injection What is this medicine? FULVESTRANT (ful VES trant) blocks the effects of estrogen. It is used to treat breast cancer in women past the age of menopause. This medicine may be used for other purposes; ask your health care provider or pharmacist if you have questions. COMMON BRAND NAME(S): FASLODEX What should I tell my health care provider before I take this medicine? They need to know if you have any of these conditions: -bleeding problems -liver disease -low levels of platelets in the blood -an unusual or allergic reaction to fulvestrant, other medicines, foods, dyes, or preservatives -pregnant or trying to get pregnant -breast-feeding How should I use this medicine? This medicine is for injection into a muscle. It is usually given by a health care professional in a hospital or clinic setting. Talk to your pediatrician regarding the use of this medicine in children. Special care may be needed. Overdosage: If you think you have taken too much of this medicine contact a poison control center or emergency room at once. NOTE: This medicine is only for you. Do not share this medicine with others. What if I miss a dose? It is important not to miss your dose. Call your doctor or health care professional if you are unable to keep an appointment. What may interact with this medicine? -medicines that treat or prevent blood clots like warfarin, enoxaparin, and dalteparin This list may not describe all possible interactions. Give your health care provider a list of all the medicines, herbs, non-prescription drugs, or dietary supplements you use. Also tell them if you smoke, drink alcohol, or use illegal drugs. Some items may interact with your medicine. What should I watch for while using this medicine? Your condition will be monitored carefully while you are receiving this medicine. You will need important blood work done while you are taking this medicine. Do not become pregnant  while taking this medicine. Women should inform their doctor if they wish to become pregnant or think they might be pregnant. There is a potential for serious side effects to an unborn child. Talk to your health care professional or pharmacist for more information. What side effects may I notice from receiving this medicine? Side effects that you should report to your doctor or health care professional as soon as possible: -allergic reactions like skin rash, itching or hives, swelling of the face, lips, or tongue -feeling faint or lightheaded, falls -fever or flu-like symptoms -sore throat -vaginal bleeding Side effects that usually do not require medical attention (report to your doctor or health care professional if they continue or are bothersome): -aches, pains -constipation or diarrhea -headache -hot flashes -nausea, vomiting -pain at site where injected -stomach pain This list may not describe all possible side effects. Call your doctor for medical advice about side effects. You may report side effects to FDA at 1-800-FDA-1088. Where should I keep my medicine? This drug is given in a hospital or clinic and will not be stored at home. NOTE: This sheet is a summary. It may not cover all possible information. If you have questions about this medicine, talk to your doctor, pharmacist, or health care provider.  2014, [employer] Standard. (2008-01-30 15:39:24)  

## 2013-09-15 NOTE — Progress Notes (Signed)
Hematology and Oncology Follow Up Visit  [name] [id number] Nov 18, 1921 79 y.o. 09/15/2013 8:40 AM [name], MDJohn, Barbara Blalock, MD   Chief Complaint: Left breast cancer.   Principle Diagnosis: Locally advanced cancer of the left breast diagnosed with biopsy on 01/02/2009 although changes of breast cancer were apparently evident in November 2009. There was clinical lymph node involvement. Tumor stage was T4b N1 M0, stage IIIB.  Estrogen receptor was 100%, progesterone receptor 99%. Proliferative index was 45%. HER-2/neu was negative. The patient declined surgery, radiation and chemotherapy.  Prior Therapy: Femara was started on Feb 02, 2009, and continued through 12/14/2012. The patient had an excellent response to Femara; however, in November 2013 there was suggestion of slow progression involving the left breast.   Current therapy:  Based on progression, Faslodex 500 mg IM started on 12/14/2012 with initial loading dose and now monthly IM injections.  Interim History: [name] was seen today for followup of her locally advanced cancer involving the left breast with diagnosis going back to March 2010.  The patient may have had small pulmonary metastases at the time of diagnosis.  She may also have a small focus of cancer in theright breast as per recent imaging studies.  She was last seen by me on 06/09/2013.  Today, she is accompanied today by her daughter, [name].  She continues to feel entirely well, is asymptomatic, lives alone and is totally independent and active around her home.  She is looking forward to the hosting the holidays where she enjoys doing most of the cooking.   She has been coming in for her monthly Faslodex injections. She denies any problems relating to her breast, specifically any pain or bleeding.  She feels entirely well.    SMOKING HISTORY: The patient has never smoked cigarettes.  Medications: I have reviewed the patient's current medications.  Current Outpatient  Prescriptions  Medication Sig Dispense Refill  . aspirin 81 MG tablet Take 81 mg by mouth daily.       . Cholecalciferol 5000 UNITS TABS Take 5,000 Units by mouth daily.      . Ferrous Sulfate (IRON) 325 (65 FE) MG TABS Take 325 mg by mouth daily.      . metoprolol (LOPRESSOR) 50 MG tablet Take 1 tablet (50 mg total) by mouth 2 (two) times daily.  180 tablet  3  . Multiple Vitamin (MULTIVITAMIN) tablet Take 1 tablet by mouth daily.      Marland Kitchen senna (SENOKOT) 8.6 MG tablet Take 1 tablet by mouth as needed.      . vitamin B-12 (CYANOCOBALAMIN) 100 MCG tablet Take 50 mcg by mouth daily.       No current facility-administered medications for this visit.   Allergies:  Allergies  Allergen Reactions  . Avapro [Irbesartan] Rash  . Ace Inhibitors Itching  . Amlodipine Nausea Only and Rash    Leg cramps  . Lisinopril Rash   Hematology/Oncology Problem List: 1. Cystic masses involving the right breast with negative biopsy on 01/02/2009.  2. Iron-deficiency anemia requiring hospitalization, 4 units of blood and intravenous iron. The patient had positive stools but a negative colonoscopy on 02/28/2009. Feraheme 510 mg IV was given on 11/06/2011, 11/13/2011, 08/12/2012 and 08/19/2012.   Past Medical History, Surgical history, Social history, and Family History were reviewed and updated.  Review of Systems: Constitutional:  Negative for fever, chills, night sweats, anorexia, weight loss, pain. Cardiovascular: no chest pain or dyspnea on exertion Respiratory: no cough, shortness of breath, or wheezing  Neurological: no TIA or stroke symptoms Dermatological: negative for lumps and skin lesion changes ENT: negative for - epistaxis Skin: Negative. Gastrointestinal: no abdominal pain, change in bowel habits, or black or bloody stools Genito-Urinary: no dysuria, trouble voiding, or hematuria Hematological and Lymphatic: negative for - bleeding problems or bruising Breast: negative for breast  lumps Musculoskeletal: negative for - gait disturbance Remaining ROS negative. Physical Exam: There were no vitals taken for this visit. ECOG: 1 General appearance: alert, cooperative, appears stated age and no distress Head: Normocephalic, without obvious abnormality, atraumatic Neck: no adenopathy, supple, symmetrical, trachea midline and thyroid not enlarged, symmetric, no tenderness/mass/nodules Lymph nodes: Cervical adenopathy: None appreciated and Supraclavicular adenopathy: None appreciated Heart:regular rate and rhythm, S1, S2 normal, no murmur, click, rub or gallop Lung:chest clear, no wheezing, rales, normal symmetric air entry, Heart exam - S1, S2 normal, no murmur, no gallop, rate regular Breast: Left breast reveals continued distortion with retraction of the nipple areolar complex.  There continues to be denuded area with crust just tot he left of where the nipple areolar complex would be, measuring 2 cm.  There is some induration superiorly to the location of the nipple areolar complex.  Left axilla with some hard axillary lymph node less than 2 cm.  The right breast is benign without masses.  No adenopathy or palpable lump or dimpling.  Abdomin: soft, non-tender, without masses or organomegaly EXT:No peripheral edema Neuro: No focal deficits  Lab Results: Lab Results  Component Value Date   WBC 5.2 09/08/2013   HGB 12.1 09/08/2013   HCT 36.7 09/08/2013   MCV 95.1 09/08/2013   PLT 230 09/08/2013     Chemistry      Component Value Date/Time   NA 139 09/08/2013 0842   NA 139 03/18/2012 1339   K 4.3 09/08/2013 0842   K 4.3 03/18/2012 1339   CL 105 03/10/2013 1536   CL 105 03/18/2012 1339   CO2 27 09/08/2013 0842   CO2 27 03/18/2012 1339   BUN 10.7 09/08/2013 0842   BUN 13 03/18/2012 1339   CREATININE 0.7 09/08/2013 0842   CREATININE 0.78 03/18/2012 1339      Component Value Date/Time   CALCIUM 9.1 09/08/2013 0842   CALCIUM 9.1 03/18/2012 1339   ALKPHOS 92 09/08/2013 0842   ALKPHOS  96 03/18/2012 1339   AST 18 09/08/2013 0842   AST 18 03/18/2012 1339   ALT 14 09/08/2013 0842   ALT 16 03/18/2012 1339   BILITOT 0.30 09/08/2013 0842   BILITOT 0.3 03/18/2012 1339      Results for ROYETTA, PROBUS (MRN [id number]) as of 09/17/2013 11:28  Ref. Range 06/09/2013 09:48 09/08/2013 08:42  CA 27.29 Latest Range: 0-39 U/mL 16 21    Results for OTIS, BURRESS (MRN [id number]) as of 09/17/2013 11:28  Ref. Range 09/08/2013 08:42  CEA Latest Range: 0.0-5.0 ng/mL 3.8   Radiological Studies: 1. Chest x-ray on 06/27/2010 showed no acute cardiopulmonary process.  2. Digital diagnostic bilateral mammograms on 07/04/2010 showed the masses in both breasts.  3. Digital diagnostic bilateral mammograms carried out on 07/31/2011 showed a dense spiculated mass containing central biopsy clip  artifact in the slightly upper outer left breast with associated nipple and skin retraction without significant change since the most recent prior study of 07/04/2010. On mammogram, this mass measures approximately 2.7 x 2.5 x 2.9 cm. No new mass is identified in the left breast. On the right side, the circumscribed more superiorly and posteriorly positioned mass has decreased  in size since 07/04/2010. On the mammogram, it currently measures 3.2 x 3.2 x 2.8 cm, whereas previously it measured 4.0 x 3.8 x 3.4 cm. The more irregular mass in the upper central right breast just anterior to the  circumscribed mass is without significant change. It measured approximately 2.9 cm in greatest dimension and is felt to be stable. No new masses identified in the right breast.  4. Chest x-ray, 2 view, from 08/05/2012 showed a large hiatal hernia, but no acute cardiopulmonary disease.  5. Digital diagnostic bilateral mammogram and bilateral breast ultrasound were carried out on 08/12/2012. These studies were compared with the study done from 07/31/2011. There was suggestion of progression. A mass in the left breast that measured 3.3 cm on  11/08 had previously measured 2.6 cm by mammogram. There was another mass seen in the lower central aspect of the left breast measuring 1.1 cm whereas previously on 07/31/2011 it measured 0.6 cm. Ultrasound was carried out on 08/12/2012. The dominant mass in the left breast measured 2.5 x 2.4 x 3.0 cm by ultrasound. The smaller lesion by ultrasound measured 1.1 x 0.7 x 0.9 cm. I do not believe we had an ultrasound last year for comparison. A lymph node was seen in the left axilla measuring 1.1 x 0.9 x 1.4 cm. There was obliteration of the fatty hilum. There were also by ultrasound masses in the right breast. A mass in the superior aspect of the right breast measured 2.4 x 2.6 x 0.6 cm. This mass was located 3 cm from the nipple. A second mass which was located 1 cm from the nipple measured 1.0 x 0.8 x 1.1 cm. It was felt that the known biopsy proven malignancy in the left breast was progressing. The masses in the right breast were decreasing.  6. Mammogram and ultrasound of the left breast from 08/12/2012 suggested progression compared with the prior mammogram of 07/31/2011. The mass which is seen in about the 3 o'clock position now measures about 3.3 cm as compared with 2.6 cm previously. There was another mass seen at around the 6 o'clock position that measures 1.1 cm, whereas previously it measured 0.6 cm. There was also felt to be a lymph node in the left axilla measuring about 1.4 cm in maximum dimension. There was some obliteration of the fatty hilum suggesting that this may, in fact, be replaced by cancer. Ultrasound exam of the right breast showed a mass in the superior aspect measuring 2.4 x 2.6 x 0.6 cm. The mass is located about 3 cm from the nipple. There was a second mass located about 1 cm from the nipple and this measured about 1.1 cm. The masses  in the right breast have decreased compared with prior studies, whereas the malignancy in the left breast seems to be progressing.  7. PET scan carried  out on 09/09/2012 was compared with the prior PET scan from 01/04/2009. Once again there are 2 lesions seen in the left breast. The larger lesion is located at about the 3 o'clock position. There is a smaller lesion at about 6 o'clock which may be new, as well as a left axillary lymph node. There was previously a dumbbell-shaped lesion in the right breast which was quite large. There is now a small residual focus corresponding to the upper part of the previous dumbbell lesion with some increased activity which raises concern that this residual lesion may be malignant. In reviewing the original PET scan from 01/04/2009, it appears that the patient  may have had some small pulmonary metastases which had regressed on treatment with Femara. Some of the lesions are still present on the most recent PET scan from 09/09/2012, but clearly are smaller.  8. Digital diagnostic mammogram of the left breast on 12/02/2012 shows progression compared with the prior study from 08/12/2012. The larger lesion at about 3 o'clock in the left breast now is felt to measure 3.8 cm in maximum dimension. The second lesion at about 6 o'clock measures 1.1 cm in maximum dimension.  9. Chest x-ray, 2 view, from 12/02/2012 showed some emphysematous changes, but no acute cardiopulmonary findings. There was a large hiatal hernia.  Impression and Plan:  1. Left breast cancer.  - Ms. Blash is doing quite well. Her left breast appears to be stable.  She notes some mild bleeding occasionally and she does have lymphadenopathy in the left axilla.  Clinically she is doing superbly well. -Today she will receive another dose of Faslodex 500 mg IM.  We will continue with present management.  As stated, Ms. Sankey is completely asymptomatic.  On prior visits, she did see Dr. Lonie Peak for a radiation oncology consultation on 12/30/2012.  Dr. Basilio Cairo was not infavor of palliative radiation; instead recommended a mastectomy if the patient should need that.   Patient is not at all enthusiastic about having a mastectomy.  Hopefully that will not be necessary. - Her tumor markers are stable with a mild increase.  We will repeat to establish trend. -She will repeat her mammogram prior to her next visit.  - She will return monthly for injections.  She has an appointment to return in 3 months and she will have CBC, chemistries, CEA and CA27.29.  2. Iron-deficiency Anemia. - Her hemoglobin today is stable.   Spent more than half the time coordinating care.    Wonda Goodgame, MD 12/12/20148:40 AM

## 2013-09-15 NOTE — Telephone Encounter (Signed)
s.w. pt adn advised on Jan thru March 2015 appts...ok and aware

## 2013-09-29 ENCOUNTER — Ambulatory Visit (HOSPITAL_BASED_OUTPATIENT_CLINIC_OR_DEPARTMENT_OTHER): Payer: Medicare Other

## 2013-09-29 VITALS — BP 164/80 | HR 88 | Temp 97.9°F

## 2013-09-29 DIAGNOSIS — C50419 Malignant neoplasm of upper-outer quadrant of unspecified female breast: Secondary | ICD-10-CM

## 2013-09-29 DIAGNOSIS — Z5111 Encounter for antineoplastic chemotherapy: Secondary | ICD-10-CM

## 2013-09-29 DIAGNOSIS — C50912 Malignant neoplasm of unspecified site of left female breast: Secondary | ICD-10-CM

## 2013-09-29 MED ORDER — FULVESTRANT 250 MG/5ML IM SOLN
500.0000 mg | Freq: Once | INTRAMUSCULAR | Status: AC
  Administered 2013-09-29: 500 mg via INTRAMUSCULAR
  Filled 2013-09-29: qty 10

## 2013-10-19 ENCOUNTER — Telehealth: Payer: Self-pay | Admitting: Internal Medicine

## 2013-10-19 NOTE — Telephone Encounter (Signed)
r/s from 1/23 to 1/24 per pt rqst

## 2013-10-27 ENCOUNTER — Ambulatory Visit: Payer: Medicare Other

## 2013-10-28 ENCOUNTER — Ambulatory Visit (HOSPITAL_BASED_OUTPATIENT_CLINIC_OR_DEPARTMENT_OTHER): Payer: Medicare Other

## 2013-10-28 VITALS — BP 163/89 | HR 75 | Temp 96.7°F | Resp 18

## 2013-10-28 DIAGNOSIS — Z5111 Encounter for antineoplastic chemotherapy: Secondary | ICD-10-CM

## 2013-10-28 DIAGNOSIS — C50419 Malignant neoplasm of upper-outer quadrant of unspecified female breast: Secondary | ICD-10-CM

## 2013-10-28 DIAGNOSIS — C50912 Malignant neoplasm of unspecified site of left female breast: Secondary | ICD-10-CM

## 2013-10-28 MED ORDER — FULVESTRANT 250 MG/5ML IM SOLN
500.0000 mg | Freq: Once | INTRAMUSCULAR | Status: AC
  Administered 2013-10-28: 500 mg via INTRAMUSCULAR

## 2013-10-28 NOTE — Patient Instructions (Signed)
Fulvestrant injection What is this medicine? FULVESTRANT (ful VES trant) blocks the effects of estrogen. It is used to treat breast cancer in women past the age of menopause. This medicine may be used for other purposes; ask your health care provider or pharmacist if you have questions. COMMON BRAND NAME(S): FASLODEX What should I tell my health care provider before I take this medicine? They need to know if you have any of these conditions: -bleeding problems -liver disease -low levels of platelets in the blood -an unusual or allergic reaction to fulvestrant, other medicines, foods, dyes, or preservatives -pregnant or trying to get pregnant -breast-feeding How should I use this medicine? This medicine is for injection into a muscle. It is usually given by a health care professional in a hospital or clinic setting. Talk to your pediatrician regarding the use of this medicine in children. Special care may be needed. Overdosage: If you think you have taken too much of this medicine contact a poison control center or emergency room at once. NOTE: This medicine is only for you. Do not share this medicine with others. What if I miss a dose? It is important not to miss your dose. Call your doctor or health care professional if you are unable to keep an appointment. What may interact with this medicine? -medicines that treat or prevent blood clots like warfarin, enoxaparin, and dalteparin This list may not describe all possible interactions. Give your health care provider a list of all the medicines, herbs, non-prescription drugs, or dietary supplements you use. Also tell them if you smoke, drink alcohol, or use illegal drugs. Some items may interact with your medicine. What should I watch for while using this medicine? Your condition will be monitored carefully while you are receiving this medicine. You will need important blood work done while you are taking this medicine. Do not become pregnant  while taking this medicine. Women should inform their doctor if they wish to become pregnant or think they might be pregnant. There is a potential for serious side effects to an unborn child. Talk to your health care professional or pharmacist for more information. What side effects may I notice from receiving this medicine? Side effects that you should report to your doctor or health care professional as soon as possible: -allergic reactions like skin rash, itching or hives, swelling of the face, lips, or tongue -feeling faint or lightheaded, falls -fever or flu-like symptoms -sore throat -vaginal bleeding Side effects that usually do not require medical attention (report to your doctor or health care professional if they continue or are bothersome): -aches, pains -constipation or diarrhea -headache -hot flashes -nausea, vomiting -pain at site where injected -stomach pain This list may not describe all possible side effects. Call your doctor for medical advice about side effects. You may report side effects to FDA at 1-800-FDA-1088. Where should I keep my medicine? This drug is given in a hospital or clinic and will not be stored at home. NOTE: This sheet is a summary. It may not cover all possible information. If you have questions about this medicine, talk to your doctor, pharmacist, or health care provider.  2014, [employer] Standard. (2008-01-30 15:39:24)  

## 2013-10-28 NOTE — Progress Notes (Signed)
Patient arrived for faslodex inj with daughter. No complaints, concerns or pain. Injections given without difficulty, one into each buttock. Patient ambulated at discharge with daughter, in no acute distress.

## 2013-11-24 ENCOUNTER — Ambulatory Visit (HOSPITAL_BASED_OUTPATIENT_CLINIC_OR_DEPARTMENT_OTHER): Payer: Medicare Other

## 2013-11-24 VITALS — BP 156/80 | HR 76 | Temp 98.3°F

## 2013-11-24 DIAGNOSIS — C50419 Malignant neoplasm of upper-outer quadrant of unspecified female breast: Secondary | ICD-10-CM

## 2013-11-24 DIAGNOSIS — C50912 Malignant neoplasm of unspecified site of left female breast: Secondary | ICD-10-CM

## 2013-11-24 DIAGNOSIS — Z5111 Encounter for antineoplastic chemotherapy: Secondary | ICD-10-CM

## 2013-11-24 MED ORDER — FULVESTRANT 250 MG/5ML IM SOLN
500.0000 mg | Freq: Once | INTRAMUSCULAR | Status: AC
  Administered 2013-11-24: 500 mg via INTRAMUSCULAR
  Filled 2013-11-24: qty 10

## 2013-12-22 ENCOUNTER — Other Ambulatory Visit (HOSPITAL_BASED_OUTPATIENT_CLINIC_OR_DEPARTMENT_OTHER): Payer: Medicare Other

## 2013-12-22 ENCOUNTER — Ambulatory Visit (HOSPITAL_BASED_OUTPATIENT_CLINIC_OR_DEPARTMENT_OTHER): Payer: Medicare Other

## 2013-12-22 ENCOUNTER — Telehealth: Payer: Self-pay | Admitting: Internal Medicine

## 2013-12-22 ENCOUNTER — Ambulatory Visit (HOSPITAL_BASED_OUTPATIENT_CLINIC_OR_DEPARTMENT_OTHER): Payer: Medicare Other | Admitting: Internal Medicine

## 2013-12-22 VITALS — BP 221/73 | HR 75 | Temp 98.0°F | Resp 18 | Ht 62.0 in | Wt 118.6 lb

## 2013-12-22 DIAGNOSIS — C773 Secondary and unspecified malignant neoplasm of axilla and upper limb lymph nodes: Secondary | ICD-10-CM

## 2013-12-22 DIAGNOSIS — C50912 Malignant neoplasm of unspecified site of left female breast: Secondary | ICD-10-CM

## 2013-12-22 DIAGNOSIS — C50419 Malignant neoplasm of upper-outer quadrant of unspecified female breast: Secondary | ICD-10-CM

## 2013-12-22 DIAGNOSIS — D509 Iron deficiency anemia, unspecified: Secondary | ICD-10-CM

## 2013-12-22 DIAGNOSIS — C7800 Secondary malignant neoplasm of unspecified lung: Secondary | ICD-10-CM

## 2013-12-22 DIAGNOSIS — Z5111 Encounter for antineoplastic chemotherapy: Secondary | ICD-10-CM

## 2013-12-22 LAB — COMPREHENSIVE METABOLIC PANEL (CC13)
ALBUMIN: 3.5 g/dL (ref 3.5–5.0)
ALT: 13 U/L (ref 0–55)
AST: 18 U/L (ref 5–34)
Alkaline Phosphatase: 104 U/L (ref 40–150)
Anion Gap: 10 mEq/L (ref 3–11)
BUN: 9.8 mg/dL (ref 7.0–26.0)
CALCIUM: 9.3 mg/dL (ref 8.4–10.4)
CHLORIDE: 104 meq/L (ref 98–109)
CO2: 24 mEq/L (ref 22–29)
Creatinine: 0.7 mg/dL (ref 0.6–1.1)
Glucose: 99 mg/dl (ref 70–140)
POTASSIUM: 4.5 meq/L (ref 3.5–5.1)
Sodium: 138 mEq/L (ref 136–145)
TOTAL PROTEIN: 6.6 g/dL (ref 6.4–8.3)
Total Bilirubin: 0.23 mg/dL (ref 0.20–1.20)

## 2013-12-22 LAB — CANCER ANTIGEN 27.29: CA 27.29: 23 U/mL (ref 0–39)

## 2013-12-22 LAB — CEA: CEA: 5.2 ng/mL — ABNORMAL HIGH (ref 0.0–5.0)

## 2013-12-22 LAB — CBC WITH DIFFERENTIAL/PLATELET
BASO%: 1 % (ref 0.0–2.0)
BASOS ABS: 0 10*3/uL (ref 0.0–0.1)
EOS%: 1.3 % (ref 0.0–7.0)
Eosinophils Absolute: 0.1 10*3/uL (ref 0.0–0.5)
HCT: 36.4 % (ref 34.8–46.6)
HEMOGLOBIN: 11.9 g/dL (ref 11.6–15.9)
LYMPH#: 1.4 10*3/uL (ref 0.9–3.3)
LYMPH%: 31.3 % (ref 14.0–49.7)
MCH: 31 pg (ref 25.1–34.0)
MCHC: 32.8 g/dL (ref 31.5–36.0)
MCV: 94.3 fL (ref 79.5–101.0)
MONO#: 0.5 10*3/uL (ref 0.1–0.9)
MONO%: 11.4 % (ref 0.0–14.0)
NEUT%: 55 % (ref 38.4–76.8)
NEUTROS ABS: 2.5 10*3/uL (ref 1.5–6.5)
Platelets: 251 10*3/uL (ref 145–400)
RBC: 3.86 10*6/uL (ref 3.70–5.45)
RDW: 13.4 % (ref 11.2–14.5)
WBC: 4.5 10*3/uL (ref 3.9–10.3)

## 2013-12-22 LAB — LACTATE DEHYDROGENASE (CC13): LDH: 133 U/L (ref 125–245)

## 2013-12-22 MED ORDER — FULVESTRANT 250 MG/5ML IM SOLN
500.0000 mg | Freq: Once | INTRAMUSCULAR | Status: AC
  Administered 2013-12-22: 500 mg via INTRAMUSCULAR
  Filled 2013-12-22: qty 10

## 2013-12-22 NOTE — Patient Instructions (Signed)
Fulvestrant injection What is this medicine? FULVESTRANT (ful VES trant) blocks the effects of estrogen. It is used to treat breast cancer in women past the age of menopause. This medicine may be used for other purposes; ask your health care provider or pharmacist if you have questions. COMMON BRAND NAME(S): FASLODEX What should I tell my health care provider before I take this medicine? They need to know if you have any of these conditions: -bleeding problems -liver disease -low levels of platelets in the blood -an unusual or allergic reaction to fulvestrant, other medicines, foods, dyes, or preservatives -pregnant or trying to get pregnant -breast-feeding How should I use this medicine? This medicine is for injection into a muscle. It is usually given by a health care professional in a hospital or clinic setting. Talk to your pediatrician regarding the use of this medicine in children. Special care may be needed. Overdosage: If you think you have taken too much of this medicine contact a poison control center or emergency room at once. NOTE: This medicine is only for you. Do not share this medicine with others. What if I miss a dose? It is important not to miss your dose. Call your doctor or health care professional if you are unable to keep an appointment. What may interact with this medicine? -medicines that treat or prevent blood clots like warfarin, enoxaparin, and dalteparin This list may not describe all possible interactions. Give your health care provider a list of all the medicines, herbs, non-prescription drugs, or dietary supplements you use. Also tell them if you smoke, drink alcohol, or use illegal drugs. Some items may interact with your medicine. What should I watch for while using this medicine? Your condition will be monitored carefully while you are receiving this medicine. You will need important blood work done while you are taking this medicine. Do not become pregnant  while taking this medicine. Women should inform their doctor if they wish to become pregnant or think they might be pregnant. There is a potential for serious side effects to an unborn child. Talk to your health care professional or pharmacist for more information. What side effects may I notice from receiving this medicine? Side effects that you should report to your doctor or health care professional as soon as possible: -allergic reactions like skin rash, itching or hives, swelling of the face, lips, or tongue -feeling faint or lightheaded, falls -fever or flu-like symptoms -sore throat -vaginal bleeding Side effects that usually do not require medical attention (report to your doctor or health care professional if they continue or are bothersome): -aches, pains -constipation or diarrhea -headache -hot flashes -nausea, vomiting -pain at site where injected -stomach pain This list may not describe all possible side effects. Call your doctor for medical advice about side effects. You may report side effects to FDA at 1-800-FDA-1088. Where should I keep my medicine? This drug is given in a hospital or clinic and will not be stored at home. NOTE: This sheet is a summary. It may not cover all possible information. If you have questions about this medicine, talk to your doctor, pharmacist, or health care provider.  2014, [employer] Standard. (2008-01-30 15:39:24)  

## 2013-12-22 NOTE — Telephone Encounter (Signed)
Gave pt appt for lab, injections and MD for April to July 2015

## 2013-12-22 NOTE — Telephone Encounter (Signed)
Gave pt appt for lab, injections and MD for April to July 2015 °

## 2013-12-22 NOTE — Progress Notes (Signed)
Hematology and Oncology Follow Up Visit  [name] [id number] [birth year] 80 y.o. 12/22/2013 9:10 AM [name], MDJohn, Barbara Oris, MD   Chief Complaint: Left breast cancer.   Principle Diagnosis: Locally advanced cancer of the left breast diagnosed with biopsy on 01/02/2009 although changes of breast cancer were apparently evident in November 2009. There was clinical lymph node involvement. Tumor stage was T4b N1 M0, stage IIIB.  Estrogen receptor was 100%, progesterone receptor 99%. Proliferative index was 45%. HER-2/neu was negative. The patient declined surgery, radiation and chemotherapy.  Prior Therapy: Femara was started on Feb 02, 2009, and continued through 12/14/2012. The patient had an excellent response to Femara; however, in November 2013 there was suggestion of slow progression involving the left breast.   Current therapy:  Based on progression, Faslodex 500 mg IM started on 12/14/2012 with initial loading dose and now monthly IM injections.  Interim History: [name] was seen today for followup of her locally advanced cancer involving the left breast with diagnosis going back to March 2010.  The patient may have had small pulmonary metastases at the time of diagnosis.  She may also have a small focus of cancer in the right breast as per recent imaging studies.  She was last seen by me on 09/15/2013.    Today, she is accompanied today by her daughter, [name].  She continues to feel entirely well, is asymptomatic, lives alone and is totally independent and active around her home.  She has been coming in for her monthly Faslodex injections. She denies any problems relating to her breast, specifically any pain. She does report intermittent bleeding.  She has not had a mammogram due to it painful when the compress her breasts.   She feels entirely well.    SMOKING HISTORY: The patient has never smoked cigarettes.  Medications: I have reviewed the patient's current medications.  Current  Outpatient Prescriptions  Medication Sig Dispense Refill  . aspirin 81 MG tablet Take 81 mg by mouth daily.       . Cholecalciferol 5000 UNITS TABS Take 5,000 Units by mouth daily.      . Ferrous Sulfate (IRON) 325 (65 FE) MG TABS Take 325 mg by mouth daily.      . irbesartan (AVAPRO) 150 MG tablet Take 1 tablet by mouth 2 (two) times daily.      . metoprolol (LOPRESSOR) 50 MG tablet Take 1 tablet (50 mg total) by mouth 2 (two) times daily.  180 tablet  3  . Multiple Vitamin (MULTIVITAMIN) tablet Take 1 tablet by mouth daily.      Marland Kitchen senna (SENOKOT) 8.6 MG tablet Take 1 tablet by mouth as needed.      . vitamin B-12 (CYANOCOBALAMIN) 100 MCG tablet Take 50 mcg by mouth daily.       No current facility-administered medications for this visit.   Facility-Administered Medications Ordered in Other Visits  Medication Dose Route Frequency Provider Last Rate Last Dose  . fulvestrant (FASLODEX) injection 500 mg  500 mg Intramuscular Once Barbara Cooks, MD       Allergies:  Allergies  Allergen Reactions  . Ace Inhibitors Itching  . Amlodipine Nausea Only and Rash    Leg cramps  . Lisinopril Rash   Hematology/Oncology Problem List: 1. Cystic masses involving the right breast with negative biopsy on 01/02/2009.  2. Iron-deficiency anemia requiring hospitalization, 4 units of blood and intravenous iron. The patient had positive stools but a negative colonoscopy on 02/28/2009. Feraheme 510 mg  IV was given on 11/06/2011, 11/13/2011, 08/12/2012 and 08/19/2012.   Past Medical History, Surgical history, Social history, and Family History were reviewed and updated.  Review of Systems: Constitutional:  Negative for fever, chills, night sweats, anorexia, weight loss, pain. Cardiovascular: no chest pain or dyspnea on exertion Respiratory: no cough, shortness of breath, or wheezing Neurological: no TIA or stroke symptoms Dermatological: negative for lumps and skin lesion changes ENT: negative for -  epistaxis Skin: Negative. Gastrointestinal: no abdominal pain, change in bowel habits, or black or bloody stools Genito-Urinary: no dysuria, trouble voiding, or hematuria Hematological and Lymphatic: negative for - bleeding problems or bruising Breast: negative for breast lumps Musculoskeletal: negative for - gait disturbance Remaining ROS negative. Physical Exam: Blood pressure 221/73, pulse 75, temperature 98 F (36.7 C), temperature source Oral, resp. rate 18, height 5' 2"  (1.575 m), weight 118 lb 9.6 oz (53.797 kg), SpO2 99.00%. ECOG: 1 General appearance: alert, cooperative, appears stated age and no distress Head: Normocephalic, without obvious abnormality, atraumatic Neck: no adenopathy, supple, symmetrical, trachea midline and thyroid not enlarged, symmetric, no tenderness/mass/nodules Lymph nodes: Cervical adenopathy: None appreciated and Supraclavicular adenopathy: None appreciated Heart:regular rate and rhythm, S1, S2 normal, no murmur, click, rub or gallop Lung:chest clear, no wheezing, rales, normal symmetric air entry, Heart exam - S1, S2 normal, no murmur, no gallop, rate regular Breast: Left breast reveals continued distortion with retraction of the nipple areolar complex.  There continues to be denuded area with crust just tot he left of where the nipple areolar complex would be, measuring 2 cm.  There is some induration superiorly to the location of the nipple areolar complex.  Left axilla with some hard axillary lymph node less than 2 cm.  The right breast is benign without masses.  No adenopathy or palpable lump or dimpling.  Abdomin: soft, non-tender, without masses or organomegaly EXT:No peripheral edema Neuro: No focal deficits  Lab Results: Lab Results  Component Value Date   WBC 4.5 12/22/2013   HGB 11.9 12/22/2013   HCT 36.4 12/22/2013   MCV 94.3 12/22/2013   PLT 251 12/22/2013     Chemistry      Component Value Date/Time   NA 139 09/08/2013 0842   NA 139  03/18/2012 1339   K 4.3 09/08/2013 0842   K 4.3 03/18/2012 1339   CL 105 03/10/2013 1536   CL 105 03/18/2012 1339   CO2 27 09/08/2013 0842   CO2 27 03/18/2012 1339   BUN 10.7 09/08/2013 0842   BUN 13 03/18/2012 1339   CREATININE 0.7 09/08/2013 0842   CREATININE 0.78 03/18/2012 1339      Component Value Date/Time   CALCIUM 9.1 09/08/2013 0842   CALCIUM 9.1 03/18/2012 1339   ALKPHOS 92 09/08/2013 0842   ALKPHOS 96 03/18/2012 1339   AST 18 09/08/2013 0842   AST 18 03/18/2012 1339   ALT 14 09/08/2013 0842   ALT 16 03/18/2012 1339   BILITOT 0.30 09/08/2013 0842   BILITOT 0.3 03/18/2012 1339      Results for NARY, SNEED (MRN [id number]) as of 12/25/2013 06:35  Ref. Range 06/09/2013 09:48 09/08/2013 08:42 12/22/2013 08:43  CA 27.29 Latest Range: 0-39 U/mL 16 21 23   CEA Latest Range: 0.0-5.0 ng/mL 3.1 3.8 5.2 (H)    Radiological Studies: 1. Chest x-ray on 06/27/2010 showed no acute cardiopulmonary process.  2. Digital diagnostic bilateral mammograms on 07/04/2010 showed the masses in both breasts.  3. Digital diagnostic bilateral mammograms carried out on 07/31/2011 showed a  dense spiculated mass containing central biopsy clip  artifact in the slightly upper outer left breast with associated nipple and skin retraction without significant change since the most recent prior study of 07/04/2010. On mammogram, this mass measures approximately 2.7 x 2.5 x 2.9 cm. No new mass is identified in the left breast. On the right side, the circumscribed more superiorly and posteriorly positioned mass has decreased in size since 07/04/2010. On the mammogram, it currently measures 3.2 x 3.2 x 2.8 cm, whereas previously it measured 4.0 x 3.8 x 3.4 cm. The more irregular mass in the upper central right breast just anterior to the  circumscribed mass is without significant change. It measured approximately 2.9 cm in greatest dimension and is felt to be stable. No new masses identified in the right breast.  4. Chest x-ray, 2 view,  from 08/05/2012 showed a large hiatal hernia, but no acute cardiopulmonary disease.  5. Digital diagnostic bilateral mammogram and bilateral breast ultrasound were carried out on 08/12/2012. These studies were compared with the study done from 07/31/2011. There was suggestion of progression. A mass in the left breast that measured 3.3 cm on 11/08 had previously measured 2.6 cm by mammogram. There was another mass seen in the lower central aspect of the left breast measuring 1.1 cm whereas previously on 07/31/2011 it measured 0.6 cm. Ultrasound was carried out on 08/12/2012. The dominant mass in the left breast measured 2.5 x 2.4 x 3.0 cm by ultrasound. The smaller lesion by ultrasound measured 1.1 x 0.7 x 0.9 cm. I do not believe we had an ultrasound last year for comparison. A lymph node was seen in the left axilla measuring 1.1 x 0.9 x 1.4 cm. There was obliteration of the fatty hilum. There were also by ultrasound masses in the right breast. A mass in the superior aspect of the right breast measured 2.4 x 2.6 x 0.6 cm. This mass was located 3 cm from the nipple. A second mass which was located 1 cm from the nipple measured 1.0 x 0.8 x 1.1 cm. It was felt that the known biopsy proven malignancy in the left breast was progressing. The masses in the right breast were decreasing.  6. Mammogram and ultrasound of the left breast from 08/12/2012 suggested progression compared with the prior mammogram of 07/31/2011. The mass which is seen in about the 3 o'clock position now measures about 3.3 cm as compared with 2.6 cm previously. There was another mass seen at around the 6 o'clock position that measures 1.1 cm, whereas previously it measured 0.6 cm. There was also felt to be a lymph node in the left axilla measuring about 1.4 cm in maximum dimension. There was some obliteration of the fatty hilum suggesting that this may, in fact, be replaced by cancer. Ultrasound exam of the right breast showed a mass in the superior  aspect measuring 2.4 x 2.6 x 0.6 cm. The mass is located about 3 cm from the nipple. There was a second mass located about 1 cm from the nipple and this measured about 1.1 cm. The masses  in the right breast have decreased compared with prior studies, whereas the malignancy in the left breast seems to be progressing.  7. PET scan carried out on 09/09/2012 was compared with the prior PET scan from 01/04/2009. Once again there are 2 lesions seen in the left breast. The larger lesion is located at about the 3 o'clock position. There is a smaller lesion at about 6 o'clock which may  be new, as well as a left axillary lymph node. There was previously a dumbbell-shaped lesion in the right breast which was quite large. There is now a small residual focus corresponding to the upper part of the previous dumbbell lesion with some increased activity which raises concern that this residual lesion may be malignant. In reviewing the original PET scan from 01/04/2009, it appears that the patient may have had some small pulmonary metastases which had regressed on treatment with Femara. Some of the lesions are still present on the most recent PET scan from 09/09/2012, but clearly are smaller.  8. Digital diagnostic mammogram of the left breast on 12/02/2012 shows progression compared with the prior study from 08/12/2012. The larger lesion at about 3 o'clock in the left breast now is felt to measure 3.8 cm in maximum dimension. The second lesion at about 6 o'clock measures 1.1 cm in maximum dimension.  9. Chest x-ray, 2 view, from 12/02/2012 showed some emphysematous changes, but no acute cardiopulmonary findings. There was a large hiatal hernia.  Impression and Plan:  1. Left breast cancer.  - Ms. Yale is doing quite well. Her left breast appears to be stable.  She notes some mild bleeding occasionally and she does have lymphadenopathy in the left axilla.  Clinically she is doing superbly well. -Today she will receive  another dose of Faslodex 500 mg IM.  We will continue with present management.  As stated, Ms. Hoopingarner is completely asymptomatic.  On prior visits, she did see Dr. Eppie Gibson for a radiation oncology consultation on 12/30/2012.  Dr. Isidore Moos was not infavor of palliative radiation; instead recommended a mastectomy if the patient should need that.  Patient is not at all enthusiastic about having a mastectomy.  Hopefully that will not be necessary. - Her tumor markers are stable with a mild increase.  We will repeat to establish trend. -She requests not to peat her mammogram.  Thus, we will order a CT of chest.   - She will return monthly for injections.  She has an appointment to return in 3 months and she will have CBC, chemistries, CEA and CA27.29.  2. Iron-deficiency Anemia. - Her hemoglobin today is stable.   Spent more than half the time coordinating care.    Ida Milbrath, MD 3/20/20159:10 AM

## 2014-01-19 ENCOUNTER — Ambulatory Visit (HOSPITAL_BASED_OUTPATIENT_CLINIC_OR_DEPARTMENT_OTHER): Payer: Medicare Other

## 2014-01-19 VITALS — BP 164/90 | HR 76 | Temp 97.6°F

## 2014-01-19 DIAGNOSIS — C50419 Malignant neoplasm of upper-outer quadrant of unspecified female breast: Secondary | ICD-10-CM

## 2014-01-19 DIAGNOSIS — C50912 Malignant neoplasm of unspecified site of left female breast: Secondary | ICD-10-CM

## 2014-01-19 DIAGNOSIS — C773 Secondary and unspecified malignant neoplasm of axilla and upper limb lymph nodes: Secondary | ICD-10-CM

## 2014-01-19 DIAGNOSIS — Z5111 Encounter for antineoplastic chemotherapy: Secondary | ICD-10-CM

## 2014-01-19 MED ORDER — FULVESTRANT 250 MG/5ML IM SOLN
500.0000 mg | Freq: Once | INTRAMUSCULAR | Status: AC
  Administered 2014-01-19: 500 mg via INTRAMUSCULAR
  Filled 2014-01-19: qty 10

## 2014-02-12 ENCOUNTER — Telehealth: Payer: Self-pay | Admitting: *Deleted

## 2014-02-12 NOTE — Telephone Encounter (Signed)
I have called and left the patient a message to call me back. We need to move her appt for 5/15 to a later time due to a meeting

## 2014-02-14 ENCOUNTER — Telehealth: Payer: Self-pay | Admitting: *Deleted

## 2014-02-14 NOTE — Telephone Encounter (Signed)
I have called and left a message for the patient regarding she needs to call the office for  appt for Friday.   JMW

## 2014-02-15 ENCOUNTER — Telehealth: Payer: Self-pay | Admitting: *Deleted

## 2014-02-15 NOTE — Telephone Encounter (Signed)
Patient's daughter called and I moved the appt for tomorrow

## 2014-02-15 NOTE — Telephone Encounter (Signed)
I have called and left patient a message to call the office regarding her appt for Friday.

## 2014-02-16 ENCOUNTER — Ambulatory Visit (HOSPITAL_BASED_OUTPATIENT_CLINIC_OR_DEPARTMENT_OTHER): Payer: Medicare Other

## 2014-02-16 VITALS — BP 167/78 | HR 81 | Temp 97.5°F

## 2014-02-16 DIAGNOSIS — C50419 Malignant neoplasm of upper-outer quadrant of unspecified female breast: Secondary | ICD-10-CM

## 2014-02-16 DIAGNOSIS — Z5111 Encounter for antineoplastic chemotherapy: Secondary | ICD-10-CM

## 2014-02-16 DIAGNOSIS — C50912 Malignant neoplasm of unspecified site of left female breast: Secondary | ICD-10-CM

## 2014-02-16 DIAGNOSIS — C773 Secondary and unspecified malignant neoplasm of axilla and upper limb lymph nodes: Secondary | ICD-10-CM

## 2014-02-16 MED ORDER — FULVESTRANT 250 MG/5ML IM SOLN
500.0000 mg | Freq: Once | INTRAMUSCULAR | Status: AC
  Administered 2014-02-16: 500 mg via INTRAMUSCULAR
  Filled 2014-02-16: qty 10

## 2014-02-28 ENCOUNTER — Other Ambulatory Visit: Payer: Self-pay

## 2014-02-28 DIAGNOSIS — C50119 Malignant neoplasm of central portion of unspecified female breast: Secondary | ICD-10-CM

## 2014-02-28 MED ORDER — METHYLPREDNISOLONE (PAK) 4 MG PO TABS: ORAL_TABLET | ORAL | Status: DC

## 2014-02-28 NOTE — Telephone Encounter (Signed)
Lvm: [name] had called earlier that mother has rash, itches, can't sleep. They believe it is from the faslodex shot. LVM that we ordered medrol dose pack, dr Juliann Mule will discuss w/pt before next faslodex. treatment.

## 2014-03-01 ENCOUNTER — Telehealth: Payer: Self-pay | Admitting: Medical Oncology

## 2014-03-01 ENCOUNTER — Encounter: Payer: Self-pay | Admitting: Medical Oncology

## 2014-03-01 ENCOUNTER — Other Ambulatory Visit: Payer: Self-pay | Admitting: Medical Oncology

## 2014-03-01 DIAGNOSIS — C50119 Malignant neoplasm of central portion of unspecified female breast: Secondary | ICD-10-CM

## 2014-03-01 MED ORDER — METHYLPREDNISOLONE (PAK) 4 MG PO TABS: ORAL_TABLET | ORAL | Status: DC

## 2014-03-01 NOTE — Telephone Encounter (Signed)
[name] called stating that her mother has a terrible rash. She had thought it was due to her BP medications. Her primary care MD has changed the BP medication several times but the rash continues. She had gotten a medrol dose pack from her primary care MD that did help the rash. She got her last faslodex 5/15 and noted her rash got a lot worse. She is very miserable and would like to know if Dr. Juliann Mule could call her in a medrol pack.. I discussed with Dr. Juliann Mule and he prescribe the medrol dose pack and when she come for her office visti 6/19 he will discuss changing the faslodex to a different drug. I asked [name] to call me back if this does not help or if she has other concerns.

## 2014-03-16 ENCOUNTER — Other Ambulatory Visit: Payer: Medicare Other

## 2014-03-16 ENCOUNTER — Encounter (HOSPITAL_COMMUNITY): Payer: Self-pay

## 2014-03-16 ENCOUNTER — Ambulatory Visit (HOSPITAL_COMMUNITY)
Admission: RE | Admit: 2014-03-16 | Discharge: 2014-03-16 | Disposition: A | Discharge status: Home / Self Care | Payer: Medicare Other | Source: Ambulatory Visit | Attending: Internal Medicine | Admitting: Internal Medicine

## 2014-03-16 DIAGNOSIS — C50919 Malignant neoplasm of unspecified site of unspecified female breast: Secondary | ICD-10-CM | POA: Insufficient documentation

## 2014-03-16 DIAGNOSIS — C50912 Malignant neoplasm of unspecified site of left female breast: Secondary | ICD-10-CM

## 2014-03-16 DIAGNOSIS — J984 Other disorders of lung: Secondary | ICD-10-CM | POA: Insufficient documentation

## 2014-03-16 DIAGNOSIS — K769 Liver disease, unspecified: Secondary | ICD-10-CM | POA: Insufficient documentation

## 2014-03-16 DIAGNOSIS — D509 Iron deficiency anemia, unspecified: Secondary | ICD-10-CM

## 2014-03-16 LAB — CBC WITH DIFFERENTIAL/PLATELET
BASO%: 0.2 % (ref 0.0–2.0)
BASOS ABS: 0 10*3/uL (ref 0.0–0.1)
EOS ABS: 0 10*3/uL (ref 0.0–0.5)
EOS%: 0.1 % (ref 0.0–7.0)
HEMATOCRIT: 35.1 % (ref 34.8–46.6)
HEMOGLOBIN: 11.1 g/dL — AB (ref 11.6–15.9)
LYMPH#: 1.1 10*3/uL (ref 0.9–3.3)
LYMPH%: 11.2 % — ABNORMAL LOW (ref 14.0–49.7)
MCH: 29.5 pg (ref 25.1–34.0)
MCHC: 31.6 g/dL (ref 31.5–36.0)
MCV: 93.4 fL (ref 79.5–101.0)
MONO#: 0.5 10*3/uL (ref 0.1–0.9)
MONO%: 5.4 % (ref 0.0–14.0)
NEUT#: 8.2 10*3/uL — ABNORMAL HIGH (ref 1.5–6.5)
NEUT%: 83.1 % — AB (ref 38.4–76.8)
PLATELETS: 257 10*3/uL (ref 145–400)
RBC: 3.76 10*6/uL (ref 3.70–5.45)
RDW: 14 % (ref 11.2–14.5)
WBC: 9.9 10*3/uL (ref 3.9–10.3)

## 2014-03-16 LAB — COMPREHENSIVE METABOLIC PANEL (CC13)
ALT: 13 U/L (ref 0–55)
AST: 15 U/L (ref 5–34)
Albumin: 3.5 g/dL (ref 3.5–5.0)
Alkaline Phosphatase: 75 U/L (ref 40–150)
Anion Gap: 8 mEq/L (ref 3–11)
BILIRUBIN TOTAL: 0.24 mg/dL (ref 0.20–1.20)
BUN: 14.3 mg/dL (ref 7.0–26.0)
CALCIUM: 8.9 mg/dL (ref 8.4–10.4)
CHLORIDE: 103 meq/L (ref 98–109)
CO2: 25 meq/L (ref 22–29)
Creatinine: 0.8 mg/dL (ref 0.6–1.1)
Glucose: 113 mg/dl (ref 70–140)
POTASSIUM: 4.4 meq/L (ref 3.5–5.1)
Sodium: 135 mEq/L — ABNORMAL LOW (ref 136–145)
Total Protein: 6.4 g/dL (ref 6.4–8.3)

## 2014-03-16 LAB — CANCER ANTIGEN 27.29: CA 27.29: 24 U/mL (ref 0–39)

## 2014-03-16 LAB — CEA: CEA: 5.1 ng/mL — AB (ref 0.0–5.0)

## 2014-03-16 LAB — LACTATE DEHYDROGENASE (CC13): LDH: 133 U/L (ref 125–245)

## 2014-03-16 MED ORDER — IOHEXOL 300 MG/ML  SOLN
80.0000 mL | Freq: Once | INTRAMUSCULAR | Status: AC | PRN
  Administered 2014-03-16: 80 mL via INTRAVENOUS

## 2014-03-23 ENCOUNTER — Ambulatory Visit (HOSPITAL_BASED_OUTPATIENT_CLINIC_OR_DEPARTMENT_OTHER): Payer: Medicare Other | Admitting: Internal Medicine

## 2014-03-23 ENCOUNTER — Telehealth: Payer: Self-pay | Admitting: Internal Medicine

## 2014-03-23 ENCOUNTER — Ambulatory Visit: Payer: Medicare Other

## 2014-03-23 ENCOUNTER — Telehealth: Payer: Self-pay

## 2014-03-23 VITALS — BP 166/81 | HR 76 | Temp 98.4°F | Resp 18 | Ht 62.0 in | Wt 115.6 lb

## 2014-03-23 DIAGNOSIS — R599 Enlarged lymph nodes, unspecified: Secondary | ICD-10-CM

## 2014-03-23 DIAGNOSIS — D509 Iron deficiency anemia, unspecified: Secondary | ICD-10-CM

## 2014-03-23 DIAGNOSIS — C773 Secondary and unspecified malignant neoplasm of axilla and upper limb lymph nodes: Secondary | ICD-10-CM

## 2014-03-23 DIAGNOSIS — C50112 Malignant neoplasm of central portion of left female breast: Secondary | ICD-10-CM

## 2014-03-23 DIAGNOSIS — C7800 Secondary malignant neoplasm of unspecified lung: Secondary | ICD-10-CM

## 2014-03-23 DIAGNOSIS — C50419 Malignant neoplasm of upper-outer quadrant of unspecified female breast: Secondary | ICD-10-CM

## 2014-03-23 MED ORDER — LETROZOLE 2.5 MG PO TABS: 2.5000 mg | ORAL_TABLET | Freq: Every day | ORAL | Status: DC

## 2014-03-23 MED ORDER — PALBOCICLIB 125 MG PO CAPS: ORAL_CAPSULE | ORAL | Status: DC

## 2014-03-23 NOTE — Progress Notes (Signed)
Hematology and Oncology Follow Up Visit  [name] [id number] 04-19-1922 80 y.o. 03/23/2014 9:46 AM [name], MDJohn, Barbara Oris, MD   Chief Complaint: Left breast cancer.   Principle Diagnosis: Locally advanced cancer of the left breast diagnosed with biopsy on 01/02/2009 although changes of breast cancer were apparently evident in November 2009. There was clinical lymph node involvement. Tumor stage was T4b N1 M0, stage IIIB.  Estrogen receptor was 100%, progesterone receptor 99%. Proliferative index was 45%. HER-2/neu was negative. The patient declined surgery, radiation and chemotherapy.  Prior Therapy: Femara was started on Feb 02, 2009, and continued through 12/14/2012. The patient had an excellent response to Femara; however, in November 2013 there was suggestion of slow progression involving the left breast.   Current therapy:  Based on progression, Faslodex 500 mg IM started on 12/14/2012 with initial loading dose and now monthly IM injections.  Interim History: [name] was seen today for followup of her locally advanced cancer involving the left breast with diagnosis going back to March 2010.  The patient may have had small pulmonary metastases at the time of diagnosis.  She may also have a small focus of cancer in the right breast as per recent imaging studies.  She was last seen by me on 12/22/2013.    Today, she is accompanied today by her daughter, [name].  She continues to feel entirely well, is asymptomatic, lives alone and is totally independent and active around her home.  She has been coming in for her monthly Faslodex injections and reports recently a severe rash following receipt of her last faslodex injection. She denies any problems relating to her breast, specifically any pain. She does report intermittent bleeding on her left breast.  She has not had a mammogram due to it painful when the compress her breasts.   She feels entirely well.    SMOKING HISTORY: The patient has  never smoked cigarettes.  Medications: I have reviewed the patient's current medications.  Current Outpatient Prescriptions  Medication Sig Dispense Refill  . aspirin 81 MG tablet Take 81 mg by mouth daily.       . Cholecalciferol 5000 UNITS TABS Take 5,000 Units by mouth daily.      . Ferrous Sulfate (IRON) 325 (65 FE) MG TABS Take 325 mg by mouth daily.      . irbesartan (AVAPRO) 150 MG tablet Take 1 tablet by mouth 2 (two) times daily.      . metoprolol (LOPRESSOR) 50 MG tablet Take 1 tablet (50 mg total) by mouth 2 (two) times daily.  180 tablet  3  . Multiple Vitamin (MULTIVITAMIN) tablet Take 1 tablet by mouth daily.      Marland Kitchen senna (SENOKOT) 8.6 MG tablet Take 1 tablet by mouth as needed.      . vitamin B-12 (CYANOCOBALAMIN) 100 MCG tablet Take 50 mcg by mouth daily.       No current facility-administered medications for this visit.   Allergies:  Allergies  Allergen Reactions  . Ace Inhibitors Itching  . Amlodipine Nausea Only and Rash    Leg cramps  . Lisinopril Rash   Hematology/Oncology Problem List: 1. Cystic masses involving the right breast with negative biopsy on 01/02/2009.  2. Iron-deficiency anemia requiring hospitalization, 4 units of blood and intravenous iron. The patient had positive stools but a negative colonoscopy on 02/28/2009. Feraheme 510 mg IV was given on 11/06/2011, 11/13/2011, 08/12/2012 and 08/19/2012.   Past Medical History, Surgical history, Social history, and Family History  were reviewed and updated.  Review of Systems: Constitutional:  Negative for fever, chills, night sweats, anorexia, weight loss, pain. Cardiovascular: no chest pain or dyspnea on exertion Respiratory: no cough, shortness of breath, or wheezing Neurological: no TIA or stroke symptoms Dermatological: negative for lumps and skin lesion changes ENT: negative for - epistaxis Skin: Negative. Gastrointestinal: no abdominal pain, change in bowel habits, or black or bloody  stools Genito-Urinary: no dysuria, trouble voiding, or hematuria Hematological and Lymphatic: negative for - bleeding problems or bruising Breast: negative for breast lumps Musculoskeletal: negative for - gait disturbance Remaining ROS negative. Physical Exam: Blood pressure 166/81, pulse 76, temperature 98.4 F (36.9 C), temperature source Oral, resp. rate 18, height 5' 2" (1.575 m), weight 115 lb 9.6 oz (52.436 kg), SpO2 100.00%. ECOG: 1 General appearance: alert, cooperative, appears stated age and no distress Head: Normocephalic, without obvious abnormality, atraumatic Neck: no adenopathy, supple, symmetrical, trachea midline and thyroid not enlarged, symmetric, no tenderness/mass/nodules Lymph nodes: Cervical adenopathy: None appreciated and Supraclavicular adenopathy: None appreciated Heart:regular rate and rhythm, S1, S2 normal, no murmur, click, rub or gallop Lung:chest clear, no wheezing, rales, normal symmetric air entry, Heart exam - S1, S2 normal, no murmur, no gallop, rate regular Breast: Left breast reveals continued distortion with retraction of the nipple areolar complex.  There continues to be denuded area with crust just tot he left of where the nipple areolar complex would be, measuring nearly 2 cm.  There is some induration superiorly to the location of the nipple areolar complex.  Left axilla with some hard axillary lymph node less than 2 cm.  The right breast is benign without masses.  No adenopathy or palpable lump or dimpling.  Abdomin: soft, non-tender, without masses or organomegaly EXT:No peripheral edema Neuro: No focal deficits  Lab Results: Lab Results  Component Value Date   WBC 9.9 03/16/2014   HGB 11.1* 03/16/2014   HCT 35.1 03/16/2014   MCV 93.4 03/16/2014   PLT 257 03/16/2014     Chemistry      Component Value Date/Time   NA 135* 03/16/2014 0830   NA 139 03/18/2012 1339   K 4.4 03/16/2014 0830   K 4.3 03/18/2012 1339   CL 105 03/10/2013 1536   CL 105  03/18/2012 1339   CO2 25 03/16/2014 0830   CO2 27 03/18/2012 1339   BUN 14.3 03/16/2014 0830   BUN 13 03/18/2012 1339   CREATININE 0.8 03/16/2014 0830   CREATININE 0.78 03/18/2012 1339      Component Value Date/Time   CALCIUM 8.9 03/16/2014 0830   CALCIUM 9.1 03/18/2012 1339   ALKPHOS 75 03/16/2014 0830   ALKPHOS 96 03/18/2012 1339   AST 15 03/16/2014 0830   AST 18 03/18/2012 1339   ALT 13 03/16/2014 0830   ALT 16 03/18/2012 1339   BILITOT 0.24 03/16/2014 0830   BILITOT 0.3 03/18/2012 1339     Radiological Studies: 1. Chest x-ray on 06/27/2010 showed no acute cardiopulmonary process.  2. Digital diagnostic bilateral mammograms on 07/04/2010 showed the masses in both breasts.  3. Digital diagnostic bilateral mammograms carried out on 07/31/2011 showed a dense spiculated mass containing central biopsy clip  artifact in the slightly upper outer left breast with associated nipple and skin retraction without significant change since the most recent prior study of 07/04/2010. On mammogram, this mass measures approximately 2.7 x 2.5 x 2.9 cm. No new mass is identified in the left breast. On the right side, the circumscribed more superiorly and  posteriorly positioned mass has decreased in size since 07/04/2010. On the mammogram, it currently measures 3.2 x 3.2 x 2.8 cm, whereas previously it measured 4.0 x 3.8 x 3.4 cm. The more irregular mass in the upper central right breast just anterior to the  circumscribed mass is without significant change. It measured approximately 2.9 cm in greatest dimension and is felt to be stable. No new masses identified in the right breast.  4. Chest x-ray, 2 view, from 08/05/2012 showed a large hiatal hernia, but no acute cardiopulmonary disease.  5. Digital diagnostic bilateral mammogram and bilateral breast ultrasound were carried out on 08/12/2012. These studies were compared with the study done from 07/31/2011. There was suggestion of progression. A mass in the left breast  that measured 3.3 cm on 11/08 had previously measured 2.6 cm by mammogram. There was another mass seen in the lower central aspect of the left breast measuring 1.1 cm whereas previously on 07/31/2011 it measured 0.6 cm. Ultrasound was carried out on 08/12/2012. The dominant mass in the left breast measured 2.5 x 2.4 x 3.0 cm by ultrasound. The smaller lesion by ultrasound measured 1.1 x 0.7 x 0.9 cm. I do not believe we had an ultrasound last year for comparison. A lymph node was seen in the left axilla measuring 1.1 x 0.9 x 1.4 cm. There was obliteration of the fatty hilum. There were also by ultrasound masses in the right breast. A mass in the superior aspect of the right breast measured 2.4 x 2.6 x 0.6 cm. This mass was located 3 cm from the nipple. A second mass which was located 1 cm from the nipple measured 1.0 x 0.8 x 1.1 cm. It was felt that the known biopsy proven malignancy in the left breast was progressing. The masses in the right breast were decreasing.  6. Mammogram and ultrasound of the left breast from 08/12/2012 suggested progression compared with the prior mammogram of 07/31/2011. The mass which is seen in about the 3 o'clock position now measures about 3.3 cm as compared with 2.6 cm previously. There was another mass seen at around the 6 o'clock position that measures 1.1 cm, whereas previously it measured 0.6 cm. There was also felt to be a lymph node in the left axilla measuring about 1.4 cm in maximum dimension. There was some obliteration of the fatty hilum suggesting that this may, in fact, be replaced by cancer. Ultrasound exam of the right breast showed a mass in the superior aspect measuring 2.4 x 2.6 x 0.6 cm. The mass is located about 3 cm from the nipple. There was a second mass located about 1 cm from the nipple and this measured about 1.1 cm. The masses  in the right breast have decreased compared with prior studies, whereas the malignancy in the left breast seems to be progressing.   7. PET scan carried out on 09/09/2012 was compared with the prior PET scan from 01/04/2009. Once again there are 2 lesions seen in the left breast. The larger lesion is located at about the 3 o'clock position. There is a smaller lesion at about 6 o'clock which may be new, as well as a left axillary lymph node. There was previously a dumbbell-shaped lesion in the right breast which was quite large. There is now a small residual focus corresponding to the upper part of the previous dumbbell lesion with some increased activity which raises concern that this residual lesion may be malignant. In reviewing the original PET scan from 01/04/2009,  it appears that the patient may have had some small pulmonary metastases which had regressed on treatment with Femara. Some of the lesions are still present on the most recent PET scan from 09/09/2012, but clearly are smaller.  8. Digital diagnostic mammogram of the left breast on 12/02/2012 shows progression compared with the prior study from 08/12/2012. The larger lesion at about 3 o'clock in the left breast now is felt to measure 3.8 cm in maximum dimension. The second lesion at about 6 o'clock measures 1.1 cm in maximum dimension.  9. Chest x-ray, 2 view, from 12/02/2012 showed some emphysematous changes, but no acute cardiopulmonary findings. There was a large hiatal hernia. 10. CT Chest w contrast on 03/16/2014 (personally reviewed by me).  Progressive enlargement of dominant left breast mass with multiple new breasts masses consistent with metastases.  Deep mass in the right breast has not significantly changed. Multiple new pulmonary nodules consistent with metastatic  disease.  New hepatic lesions suspicious for metastatic disease  Impression and Plan:  1. Left breast cancer, Stage IV now progressive.  - Ms. Scheffler is doing quite well despite evidence of her progression by CT of chest obtained on 03/16/2014.  Her left breast appears to be slightly larger with some  mild bleeding occasionally and she does have lymphadenopathy in the left axilla.  Clinically she is doing superbly well.  --Due to her disease progression, we will discontinue faslodex monthly and we will start femara 2.5 mg daily plus palbociclib based on (Finn, 2015). We had an extensive discussion regarding her progression with review of her CT of scan of the chest obtained on 06/12.  She will take 125 mg daily for 21 days then 7 days off with her letrozole daily. We discussed extensively the indications, benefits and risks including but not limited to abnormal absolute lymphocyte count, neutropenia, leukopenia, thrombocytopenia, weakness, alopecia and fatigue and nausea. She understood these risks and agree to proceed (consent in the chart for palbociclib).  Median progression-free survival was 10.2 months (95% CI 5.7-12.6) for the letrozole group and 20.2 months (13.8-27.5) for the palbociclib plus letrozole group.   --We will monitor CBC bi-weekly and return for symptom visits every month. She does not want to have chemotherapy.  If further further progression we may consider exemestane or tamoxifen.   -- On prior visits, she did see Dr. Eppie Gibson for a radiation oncology consultation on 12/30/2012.  Dr. Isidore Moos was not infavor of palliative radiation; instead recommended a mastectomy if the patient should need that.  Patient is not at all enthusiastic about having a mastectomy.  Hopefully that will not be necessary.  - Her tumor markers are stable with an overall mild increase.  We will repeat to establish trend. She requests not to repeat her mammogram.  Thus, we will order a CT of chest following two months of therapy with femara plus palbociclib. She will return biweekly for CBC for the first two months of therapy.  She has an appointment to return in 53monthand she will have CBC, chemistries, CEA and CA27.29.  2. Iron-deficiency Anemia. - Her hemoglobin today is stable.   All questions were  answered. The patient knows to call the clinic with any problems, questions or concerns. We can certainly see the patient much sooner if necessary.   I spent 25 minutes counseling the patient face to face. The total time spent in the appointment was 40 minutes.  CHISM, DAVID, MD 6/19/20159:46 AM

## 2014-03-23 NOTE — Telephone Encounter (Signed)
Rx for new chemo regiment Ibrance taken to [facility] for prior authorization

## 2014-03-23 NOTE — Telephone Encounter (Signed)
s.w. pt daughter adn advised on July appts...Marland Kitchenok and aware

## 2014-03-26 ENCOUNTER — Encounter: Payer: Self-pay | Admitting: Internal Medicine

## 2014-03-26 NOTE — Progress Notes (Signed)
TO [facility] (VIA [facility]).

## 2014-03-26 NOTE — Progress Notes (Signed)
[employer] [facility], [id number], approved ibrance from 03/26/14-03/27/15 [id number]

## 2014-03-28 ENCOUNTER — Encounter: Payer: Self-pay | Admitting: Internal Medicine

## 2014-03-28 NOTE — Progress Notes (Signed)
Faxed ibrance prescription to [facility]

## 2014-04-02 ENCOUNTER — Encounter: Payer: Self-pay | Admitting: Internal Medicine

## 2014-04-02 NOTE — Progress Notes (Signed)
Called [facility] and patient approved for Ibrance 03/30/14-03/30/15  [id number]. I will send to medical records and billing.

## 2014-04-05 ENCOUNTER — Other Ambulatory Visit: Payer: Medicare Other

## 2014-04-19 ENCOUNTER — Ambulatory Visit: Payer: Medicare Other

## 2014-04-19 ENCOUNTER — Other Ambulatory Visit: Payer: Medicare Other

## 2014-04-20 ENCOUNTER — Ambulatory Visit (HOSPITAL_BASED_OUTPATIENT_CLINIC_OR_DEPARTMENT_OTHER): Payer: Medicare Other | Admitting: Internal Medicine

## 2014-04-20 ENCOUNTER — Telehealth: Payer: Self-pay | Admitting: Internal Medicine

## 2014-04-20 ENCOUNTER — Ambulatory Visit: Payer: Medicare Other

## 2014-04-20 ENCOUNTER — Other Ambulatory Visit (HOSPITAL_BASED_OUTPATIENT_CLINIC_OR_DEPARTMENT_OTHER): Payer: Medicare Other

## 2014-04-20 VITALS — BP 140/80 | HR 80 | Temp 98.0°F | Resp 18 | Ht 62.0 in | Wt 117.3 lb

## 2014-04-20 DIAGNOSIS — C50912 Malignant neoplasm of unspecified site of left female breast: Secondary | ICD-10-CM

## 2014-04-20 DIAGNOSIS — C50419 Malignant neoplasm of upper-outer quadrant of unspecified female breast: Secondary | ICD-10-CM

## 2014-04-20 DIAGNOSIS — D61818 Other pancytopenia: Secondary | ICD-10-CM

## 2014-04-20 DIAGNOSIS — C773 Secondary and unspecified malignant neoplasm of axilla and upper limb lymph nodes: Secondary | ICD-10-CM

## 2014-04-20 DIAGNOSIS — C50112 Malignant neoplasm of central portion of left female breast: Secondary | ICD-10-CM

## 2014-04-20 LAB — COMPREHENSIVE METABOLIC PANEL (CC13)
ALT: 12 U/L (ref 0–55)
AST: 16 U/L (ref 5–34)
Albumin: 3.6 g/dL (ref 3.5–5.0)
Alkaline Phosphatase: 68 U/L (ref 40–150)
Anion Gap: 6 mEq/L (ref 3–11)
BUN: 10.1 mg/dL (ref 7.0–26.0)
CALCIUM: 8.9 mg/dL (ref 8.4–10.4)
CHLORIDE: 105 meq/L (ref 98–109)
CO2: 26 mEq/L (ref 22–29)
Creatinine: 0.8 mg/dL (ref 0.6–1.1)
Glucose: 96 mg/dl (ref 70–140)
Potassium: 4.9 mEq/L (ref 3.5–5.1)
Sodium: 137 mEq/L (ref 136–145)
Total Bilirubin: 0.27 mg/dL (ref 0.20–1.20)
Total Protein: 6.4 g/dL (ref 6.4–8.3)

## 2014-04-20 LAB — CBC WITH DIFFERENTIAL/PLATELET
BASO%: 0.7 % (ref 0.0–2.0)
Basophils Absolute: 0 10*3/uL (ref 0.0–0.1)
EOS%: 0.4 % (ref 0.0–7.0)
Eosinophils Absolute: 0 10*3/uL (ref 0.0–0.5)
HCT: 32.5 % — ABNORMAL LOW (ref 34.8–46.6)
HGB: 10.5 g/dL — ABNORMAL LOW (ref 11.6–15.9)
LYMPH%: 58.2 % — ABNORMAL HIGH (ref 14.0–49.7)
MCH: 30.6 pg (ref 25.1–34.0)
MCHC: 32.4 g/dL (ref 31.5–36.0)
MCV: 94.2 fL (ref 79.5–101.0)
MONO#: 0.1 10*3/uL (ref 0.1–0.9)
MONO%: 4.3 % (ref 0.0–14.0)
NEUT#: 0.7 10*3/uL — ABNORMAL LOW (ref 1.5–6.5)
NEUT%: 36.4 % — ABNORMAL LOW (ref 38.4–76.8)
Platelets: 55 10*3/uL — ABNORMAL LOW (ref 145–400)
RBC: 3.45 10*6/uL — AB (ref 3.70–5.45)
RDW: 14.9 % — ABNORMAL HIGH (ref 11.2–14.5)
WBC: 1.8 10*3/uL — ABNORMAL LOW (ref 3.9–10.3)
lymph#: 1.1 10*3/uL (ref 0.9–3.3)

## 2014-04-20 LAB — CEA: CEA: 3.8 ng/mL (ref 0.0–5.0)

## 2014-04-20 NOTE — Telephone Encounter (Signed)
S/w the pt and she is aware of her lab appt next week and to pick up the rest of the schedules when she comes in for that appt

## 2014-04-20 NOTE — Progress Notes (Signed)
Hematology and Oncology Follow Up Visit  [name] [id number] [birth year] 80 y.o. 04/20/2014 2:12 PM [name], MDJohn, [name], MD   Chief Complaint: Left breast cancer.   Principle Diagnosis: Locally advanced cancer of the left breast diagnosed with biopsy on 01/02/2009 although changes of breast cancer were apparently evident in November 2009. There was clinical lymph node involvement. Tumor stage was T4b N1 M0, stage IIIB.  Estrogen receptor was 100%, progesterone receptor 99%. Proliferative index was 45%. HER-2/neu was negative. The patient declined surgery, radiation and chemotherapy.   Prior Therapy: Femara was started on Feb 02, 2009, and continued through 12/14/2012. The patient had an excellent response to Femara; however, in November 2013 there was suggestion of slow progression involving the left breast. Based on progression, Faslodex 500 mg IM started on 12/14/2012 with initial loading dose and then  monthly IM injections.  Current therapy:  Femara 2.5 mg daily plus palbociclib 125 mg daily.   Interim History: [name] was seen today for followup of her locally advanced cancer involving the left breast with diagnosis going back to March 2010.  The patient may have had small pulmonary metastases at the time of diagnosis.  She may also have a small focus of cancer in the right breast as per recent imaging studies.  She was last seen by me on 03/23/2014.    Today, she is accompanied today by her daughter, [name].  She continues to feel entirely well, is asymptomatic, lives alone and is totally independent and active around her home.  Last visit she was started on combination therapy.  She denies any problems relating to her breast, specifically any pain. She does report intermittent bleeding on her left breast.  She has not had a mammogram due to it painful when the compress her breasts.   She feels entirely well.    SMOKING HISTORY: The patient has never smoked  cigarettes.  Medications: I have reviewed the patient's current medications.  Current Outpatient Prescriptions  Medication Sig Dispense Refill  . aspirin 81 MG tablet Take 81 mg by mouth daily.       . Cholecalciferol 5000 UNITS TABS Take 5,000 Units by mouth daily.      . Ferrous Sulfate (IRON) 325 (65 FE) MG TABS Take 325 mg by mouth daily.      . irbesartan (AVAPRO) 150 MG tablet Take 1 tablet by mouth 2 (two) times daily.      Marland Kitchen letrozole (FEMARA) 2.5 MG tablet Take 1 tablet (2.5 mg total) by mouth daily.  30 tablet  2  . Multiple Vitamin (MULTIVITAMIN) tablet Take 1 tablet by mouth daily.      . palbociclib (IBRANCE) 125 MG capsule Take one tablet by mouth daily for 21 days then off for 7 days every 28 days.  Take who with food.  21 capsule  2  . senna (SENOKOT) 8.6 MG tablet Take 1 tablet by mouth as needed.      . vitamin B-12 (CYANOCOBALAMIN) 100 MCG tablet Take 50 mcg by mouth daily.       No current facility-administered medications for this visit.   Allergies:  Allergies  Allergen Reactions  . Ace Inhibitors Itching  . Amlodipine Nausea Only and Rash    Leg cramps  . Lisinopril Rash   Hematology/Oncology Problem List: 1. Cystic masses involving the right breast with negative biopsy on 01/02/2009.  2. Iron-deficiency anemia requiring hospitalization, 4 units of blood and intravenous iron. The patient had positive stools but a  negative colonoscopy on 02/28/2009. Feraheme 510 mg IV was given on 11/06/2011, 11/13/2011, 08/12/2012 and 08/19/2012.   Past Medical History, Surgical history, Social history, and Family History were reviewed and updated.  Review of Systems: Constitutional:  Negative for fever, chills, night sweats, anorexia, weight loss, pain. Cardiovascular: no chest pain or dyspnea on exertion Respiratory: no cough, shortness of breath, or wheezing Neurological: no TIA or stroke symptoms Dermatological: negative for lumps and skin lesion changes ENT: negative  for - epistaxis Skin: Negative. Gastrointestinal: no abdominal pain, change in bowel habits, or black or bloody stools Genito-Urinary: no dysuria, trouble voiding, or hematuria Hematological and Lymphatic: negative for - bleeding problems or bruising Breast: negative for breast lumps Musculoskeletal: negative for - gait disturbance Remaining ROS negative. Physical Exam: Blood pressure 140/80, pulse 80, temperature 98 F (36.7 C), temperature source Oral, resp. rate 18, height 5' 2"  (1.575 m), weight 117 lb 4.8 oz (53.207 kg). ECOG: 1 General appearance: alert, cooperative, appears stated age and no distress Head: Normocephalic, without obvious abnormality, atraumatic Neck: no adenopathy, supple, symmetrical, trachea midline and thyroid not enlarged, symmetric, no tenderness/mass/nodules Lymph nodes: Cervical adenopathy: None appreciated and Supraclavicular adenopathy: None appreciated Heart:regular rate and rhythm, S1, S2 normal, no murmur, click, rub or gallop Lung:chest clear, no wheezing, rales, normal symmetric air entry, Heart exam - S1, S2 normal, no murmur, no gallop, rate regular Breast: Not examined Abdomin: soft, non-tender, without masses or organomegaly EXT:No peripheral edema Neuro: No focal deficits  Lab Results: Lab Results  Component Value Date   WBC 1.8* 04/20/2014   HGB 10.5* 04/20/2014   HCT 32.5* 04/20/2014   MCV 94.2 04/20/2014   PLT 55* 04/20/2014     Chemistry      Component Value Date/Time   NA 137 04/20/2014 0858   NA 139 03/18/2012 1339   K 4.9 04/20/2014 0858   K 4.3 03/18/2012 1339   CL 105 03/10/2013 1536   CL 105 03/18/2012 1339   CO2 26 04/20/2014 0858   CO2 27 03/18/2012 1339   BUN 10.1 04/20/2014 0858   BUN 13 03/18/2012 1339   CREATININE 0.8 04/20/2014 0858   CREATININE 0.78 03/18/2012 1339      Component Value Date/Time   CALCIUM 8.9 04/20/2014 0858   CALCIUM 9.1 03/18/2012 1339   ALKPHOS 68 04/20/2014 0858   ALKPHOS 96 03/18/2012 1339   AST 16  04/20/2014 0858   AST 18 03/18/2012 1339   ALT 12 04/20/2014 0858   ALT 16 03/18/2012 1339   BILITOT 0.27 04/20/2014 0858   BILITOT 0.3 03/18/2012 1339     Radiological Studies: 1. Chest x-ray on 06/27/2010 showed no acute cardiopulmonary process.  2. Digital diagnostic bilateral mammograms on 07/04/2010 showed the masses in both breasts.  3. Digital diagnostic bilateral mammograms carried out on 07/31/2011 showed a dense spiculated mass containing central biopsy clip  artifact in the slightly upper outer left breast with associated nipple and skin retraction without significant change since the most recent prior study of 07/04/2010. On mammogram, this mass measures approximately 2.7 x 2.5 x 2.9 cm. No new mass is identified in the left breast. On the right side, the circumscribed more superiorly and posteriorly positioned mass has decreased in size since 07/04/2010. On the mammogram, it currently measures 3.2 x 3.2 x 2.8 cm, whereas previously it measured 4.0 x 3.8 x 3.4 cm. The more irregular mass in the upper central right breast just anterior to the circumscribed mass is without significant change. It measured  approximately 2.9 cm in greatest dimension and is felt to be stable. No new masses identified in the right breast.  4. Chest x-ray, 2 view, from 08/05/2012 showed a large hiatal hernia, but no acute cardiopulmonary disease.  5. Digital diagnostic bilateral mammogram and bilateral breast ultrasound were carried out on 08/12/2012. These studies were compared with the study done from 07/31/2011. There was suggestion of progression. A mass in the left breast that measured 3.3 cm on 11/08 had previously measured 2.6 cm by mammogram. There was another mass seen in the lower central aspect of the left breast measuring 1.1 cm whereas previously on 07/31/2011 it measured 0.6 cm. Ultrasound was carried out on 08/12/2012. The dominant mass in the left breast measured 2.5 x 2.4 x 3.0 cm by ultrasound. The  smaller lesion by ultrasound measured 1.1 x 0.7 x 0.9 cm. I do not believe we had an ultrasound last year for comparison. A lymph node was seen in the left axilla measuring 1.1 x 0.9 x 1.4 cm. There was obliteration of the fatty hilum. There were also by ultrasound masses in the right breast. A mass in the superior aspect of the right breast measured 2.4 x 2.6 x 0.6 cm. This mass was located 3 cm from the nipple. A second mass which was located 1 cm from the nipple measured 1.0 x 0.8 x 1.1 cm. It was felt that the known biopsy proven malignancy in the left breast was progressing. The masses in the right breast were decreasing.  6. Mammogram and ultrasound of the left breast from 08/12/2012 suggested progression compared with the prior mammogram of 07/31/2011. The mass which is seen in about the 3 o'clock position now measures about 3.3 cm as compared with 2.6 cm previously. There was another mass seen at around the 6 o'clock position that measures 1.1 cm, whereas previously it measured 0.6 cm. There was also felt to be a lymph node in the left axilla measuring about 1.4 cm in maximum dimension. There was some obliteration of the fatty hilum suggesting that this may, in fact, be replaced by cancer. Ultrasound exam of the right breast showed a mass in the superior aspect measuring 2.4 x 2.6 x 0.6 cm. The mass is located about 3 cm from the nipple. There was a second mass located about 1 cm from the nipple and this measured about 1.1 cm. The masses  in the right breast have decreased compared with prior studies, whereas the malignancy in the left breast seems to be progressing.  7. PET scan carried out on 09/09/2012 was compared with the prior PET scan from 01/04/2009. Once again there are 2 lesions seen in the left breast. The larger lesion is located at about the 3 o'clock position. There is a smaller lesion at about 6 o'clock which may be new, as well as a left axillary lymph node. There was previously a  dumbbell-shaped lesion in the right breast which was quite large. There is now a small residual focus corresponding to the upper part of the previous dumbbell lesion with some increased activity which raises concern that this residual lesion may be malignant. In reviewing the original PET scan from 01/04/2009, it appears that the patient may have had some small pulmonary metastases which had regressed on treatment with Femara. Some of the lesions are still present on the most recent PET scan from 09/09/2012, but clearly are smaller.  8. Digital diagnostic mammogram of the left breast on 12/02/2012 shows progression compared with the  prior study from 08/12/2012. The larger lesion at about 3 o'clock in the left breast now is felt to measure 3.8 cm in maximum dimension. The second lesion at about 6 o'clock measures 1.1 cm in maximum dimension.  9. Chest x-ray, 2 view, from 12/02/2012 showed some emphysematous changes, but no acute cardiopulmonary findings. There was a large hiatal hernia. 10. CT Chest w contrast on 03/16/2014 (personally reviewed by me).  Progressive enlargement of dominant left breast mass with multiple new breasts masses consistent with metastases.  Deep mass in the right breast has not significantly changed. Multiple new pulmonary nodules consistent with metastatic  disease.  New hepatic lesions suspicious for metastatic disease  Impression and Plan:  1. Left breast cancer, Stage IV now progressive.  - Ms. Lawn is doing quite well despite evidence of her progression by CT of chest obtained on 03/16/2014.  Her left breast appears to be slightly larger with some mild bleeding occasionally and she does have lymphadenopathy in the left axilla.  Clinically she is doing superbly well.  --Due to her disease progression, we discontinued faslodex monthly started femara 2.5 mg daily plus palbociclib based on (Finn, 2015). We had an extensive discussion regarding her progression with review of her  CT of scan of the chest obtained on 06/12.  She started 125 mg daily for 21 days then 7 days off with her letrozole daily.   --We discussed extensively the indications, benefits and risks including but not limited to abnormal absolute lymphocyte count, neutropenia, leukopenia, thrombocytopenia, weakness, alopecia and fatigue and nausea. She understood these risks and agree to proceed (consent in the chart for palbociclib).  Median progression-free survival was 10.2 months (95% CI 5.7-12.6) for the letrozole group and 20.2 months (13.8-27.5) for the palbociclib plus letrozole group.   --We will monitor CBC bi-weekly and return for symptom visits every month. She does not want to have chemotherapy.  If further further progression we may consider exemestane or tamoxifen.    -- On prior visits, she did see Dr. Eppie Gibson for a radiation oncology consultation on 12/30/2012.  Dr. Isidore Moos was not in favor of palliative radiation; instead recommended a mastectomy if the patient should need that.  Patient is not at all enthusiastic about having a mastectomy.  Hopefully that will not be necessary.  - Her tumor markers are stable with an overall mild increase.  We will repeat monthly to establish trend. She requests not to repeat her mammogram.  Thus, we will order a CT of chest following two months of therapy with femara plus palbociclib. She will return biweekly for CBC for the first two months of therapy.  She has an appointment to return in 81monthand she will have CBC, chemistries, CEA and CA27.29.  2. Pancytopenia due to #1.  --Her counts are down.  She started her off week today.  We will HOLD Palbociclib and repeat laboratory testing on 04/27/2014.  We counseled them to call prior to restarting her next dose.  For modification, we will consider a 2 week on treatment followed by one week OR decreasing palbociclib to 100 mg daily for 3 weeks followed by one week off.   We will await her next CBC.  She was  counseled extensively to report any infections or fevers promptly ([place]).  She denies bleeding or symptoms of anemia.   All questions were answered. The patient knows to call the clinic with any problems, questions or concerns. We can certainly see the patient much sooner  if necessary.   I spent 15 minutes counseling the patient face to face. The total time spent in the appointment was 25 minutes.  Taichi Repka, MD 7/17/20152:12 PM

## 2014-04-21 LAB — CANCER ANTIGEN 27.29: CA 27.29: 25 U/mL (ref 0–39)

## 2014-04-23 ENCOUNTER — Ambulatory Visit: Payer: Medicare Other

## 2014-04-27 ENCOUNTER — Encounter: Payer: Self-pay | Admitting: *Deleted

## 2014-04-27 ENCOUNTER — Ambulatory Visit (HOSPITAL_BASED_OUTPATIENT_CLINIC_OR_DEPARTMENT_OTHER): Payer: Medicare Other

## 2014-04-27 ENCOUNTER — Other Ambulatory Visit: Payer: Self-pay | Admitting: Internal Medicine

## 2014-04-27 ENCOUNTER — Telehealth: Payer: Self-pay

## 2014-04-27 ENCOUNTER — Telehealth: Payer: Self-pay | Admitting: Internal Medicine

## 2014-04-27 ENCOUNTER — Other Ambulatory Visit (HOSPITAL_BASED_OUTPATIENT_CLINIC_OR_DEPARTMENT_OTHER): Payer: Medicare Other

## 2014-04-27 VITALS — BP 140/82 | HR 40 | Resp 16

## 2014-04-27 DIAGNOSIS — D702 Other drug-induced agranulocytosis: Secondary | ICD-10-CM

## 2014-04-27 DIAGNOSIS — C50912 Malignant neoplasm of unspecified site of left female breast: Secondary | ICD-10-CM

## 2014-04-27 DIAGNOSIS — C50919 Malignant neoplasm of unspecified site of unspecified female breast: Secondary | ICD-10-CM

## 2014-04-27 LAB — CBC WITH DIFFERENTIAL/PLATELET
BASO%: 1.4 % (ref 0.0–2.0)
Basophils Absolute: 0 10*3/uL (ref 0.0–0.1)
EOS%: 0 % (ref 0.0–7.0)
Eosinophils Absolute: 0 10*3/uL (ref 0.0–0.5)
HCT: 29.2 % — ABNORMAL LOW (ref 34.8–46.6)
HGB: 9.7 g/dL — ABNORMAL LOW (ref 11.6–15.9)
LYMPH#: 0.9 10*3/uL (ref 0.9–3.3)
LYMPH%: 62.4 % — ABNORMAL HIGH (ref 14.0–49.7)
MCH: 31 pg (ref 25.1–34.0)
MCHC: 33.2 g/dL (ref 31.5–36.0)
MCV: 93.3 fL (ref 79.5–101.0)
MONO#: 0.2 10*3/uL (ref 0.1–0.9)
MONO%: 11.3 % (ref 0.0–14.0)
NEUT#: 0.4 10*3/uL — CL (ref 1.5–6.5)
NEUT%: 24.9 % — ABNORMAL LOW (ref 38.4–76.8)
Platelets: 55 10*3/uL — ABNORMAL LOW (ref 145–400)
RBC: 3.13 10*6/uL — ABNORMAL LOW (ref 3.70–5.45)
RDW: 16.9 % — AB (ref 11.2–14.5)
WBC: 1.4 10*3/uL — ABNORMAL LOW (ref 3.9–10.3)
nRBC: 0 % (ref 0–0)

## 2014-04-27 MED ORDER — PALBOCICLIB 75 MG PO CAPS: ORAL_CAPSULE | ORAL | Status: DC

## 2014-04-27 MED ORDER — TBO-FILGRASTIM 300 MCG/0.5ML ~~LOC~~ SOSY
300.0000 ug | PREFILLED_SYRINGE | Freq: Once | SUBCUTANEOUS | Status: DC
  Filled 2014-04-27: qty 0.5

## 2014-04-27 MED ORDER — TBO-FILGRASTIM 300 MCG/0.5ML ~~LOC~~ SOSY
300.0000 ug | PREFILLED_SYRINGE | Freq: Once | SUBCUTANEOUS | Status: AC
  Administered 2014-04-27: 300 ug via SUBCUTANEOUS
  Filled 2014-04-27: qty 0.5

## 2014-04-27 NOTE — Telephone Encounter (Signed)
sw robin. Hold ibrance for another week. Pt needs granix shot today and tomorrow. Dr Juliann Mule will decrease ibrance dose when next she starts.

## 2014-04-27 NOTE — Telephone Encounter (Signed)
lvm for pt regarding to today adn tomorrow appt per Juliann Pulse

## 2014-04-28 ENCOUNTER — Ambulatory Visit (HOSPITAL_BASED_OUTPATIENT_CLINIC_OR_DEPARTMENT_OTHER): Payer: Medicare Other

## 2014-04-28 VITALS — BP 170/78 | HR 74 | Temp 98.1°F | Resp 22

## 2014-04-28 DIAGNOSIS — D702 Other drug-induced agranulocytosis: Secondary | ICD-10-CM

## 2014-04-28 DIAGNOSIS — C50912 Malignant neoplasm of unspecified site of left female breast: Secondary | ICD-10-CM

## 2014-04-28 DIAGNOSIS — C50919 Malignant neoplasm of unspecified site of unspecified female breast: Secondary | ICD-10-CM

## 2014-04-28 MED ORDER — TBO-FILGRASTIM 480 MCG/0.8ML ~~LOC~~ SOSY
300.0000 ug | PREFILLED_SYRINGE | Freq: Once | SUBCUTANEOUS | Status: AC
  Administered 2014-04-28: 300 ug via SUBCUTANEOUS

## 2014-04-28 NOTE — Progress Notes (Signed)
Daughter asked about [facility].  Noted documentation to hold another week and dose reduction when resumed.  Instructed to call Friday am to ensure order received to pick up before 6:00 pm on 05-04-2014.

## 2014-04-28 NOTE — Progress Notes (Signed)
Discharged with daughter

## 2014-05-04 ENCOUNTER — Other Ambulatory Visit (HOSPITAL_BASED_OUTPATIENT_CLINIC_OR_DEPARTMENT_OTHER): Payer: Medicare Other

## 2014-05-04 ENCOUNTER — Telehealth: Payer: Self-pay

## 2014-05-04 DIAGNOSIS — C773 Secondary and unspecified malignant neoplasm of axilla and upper limb lymph nodes: Secondary | ICD-10-CM

## 2014-05-04 DIAGNOSIS — C50912 Malignant neoplasm of unspecified site of left female breast: Secondary | ICD-10-CM

## 2014-05-04 DIAGNOSIS — C50419 Malignant neoplasm of upper-outer quadrant of unspecified female breast: Secondary | ICD-10-CM

## 2014-05-04 LAB — CBC WITH DIFFERENTIAL/PLATELET
BASO%: 1.4 % (ref 0.0–2.0)
Basophils Absolute: 0.1 10*3/uL (ref 0.0–0.1)
EOS ABS: 0 10*3/uL (ref 0.0–0.5)
EOS%: 0.3 % (ref 0.0–7.0)
HCT: 31.3 % — ABNORMAL LOW (ref 34.8–46.6)
HGB: 10.3 g/dL — ABNORMAL LOW (ref 11.6–15.9)
LYMPH%: 25.7 % (ref 14.0–49.7)
MCH: 31.4 pg (ref 25.1–34.0)
MCHC: 32.8 g/dL (ref 31.5–36.0)
MCV: 95.7 fL (ref 79.5–101.0)
MONO#: 0.9 10*3/uL (ref 0.1–0.9)
MONO%: 19.4 % — ABNORMAL HIGH (ref 0.0–14.0)
NEUT%: 53.2 % (ref 38.4–76.8)
NEUTROS ABS: 2.5 10*3/uL (ref 1.5–6.5)
Platelets: 225 10*3/uL (ref 145–400)
RBC: 3.27 10*6/uL — ABNORMAL LOW (ref 3.70–5.45)
RDW: 19.7 % — AB (ref 11.2–14.5)
WBC: 4.8 10*3/uL (ref 3.9–10.3)
lymph#: 1.2 10*3/uL (ref 0.9–3.3)

## 2014-05-04 NOTE — Telephone Encounter (Signed)
S/w [name] that we are faxing Rx for Ibrance to [facility]. [name] said she would be by there in about 1 hour. Note placed on Rx when faxed.

## 2014-05-11 ENCOUNTER — Encounter: Payer: Self-pay | Admitting: Gastroenterology

## 2014-05-16 ENCOUNTER — Telehealth: Payer: Self-pay | Admitting: *Deleted

## 2014-05-16 NOTE — Telephone Encounter (Signed)
Received denial letter from [employer] for services on 12/22/13.  Gave letter to care management.

## 2014-05-18 ENCOUNTER — Ambulatory Visit (HOSPITAL_BASED_OUTPATIENT_CLINIC_OR_DEPARTMENT_OTHER): Payer: Medicare Other | Admitting: Internal Medicine

## 2014-05-18 ENCOUNTER — Other Ambulatory Visit (HOSPITAL_BASED_OUTPATIENT_CLINIC_OR_DEPARTMENT_OTHER): Payer: Medicare Other

## 2014-05-18 VITALS — BP 180/92 | HR 75 | Temp 97.8°F | Resp 18 | Ht 62.0 in | Wt 116.8 lb

## 2014-05-18 DIAGNOSIS — C50419 Malignant neoplasm of upper-outer quadrant of unspecified female breast: Secondary | ICD-10-CM

## 2014-05-18 DIAGNOSIS — D709 Neutropenia, unspecified: Secondary | ICD-10-CM

## 2014-05-18 DIAGNOSIS — K769 Liver disease, unspecified: Secondary | ICD-10-CM

## 2014-05-18 DIAGNOSIS — C50912 Malignant neoplasm of unspecified site of left female breast: Secondary | ICD-10-CM

## 2014-05-18 DIAGNOSIS — C7800 Secondary malignant neoplasm of unspecified lung: Secondary | ICD-10-CM

## 2014-05-18 DIAGNOSIS — C773 Secondary and unspecified malignant neoplasm of axilla and upper limb lymph nodes: Secondary | ICD-10-CM

## 2014-05-18 DIAGNOSIS — C50112 Malignant neoplasm of central portion of left female breast: Secondary | ICD-10-CM

## 2014-05-18 DIAGNOSIS — R21 Rash and other nonspecific skin eruption: Secondary | ICD-10-CM

## 2014-05-18 DIAGNOSIS — D61818 Other pancytopenia: Secondary | ICD-10-CM

## 2014-05-18 DIAGNOSIS — D509 Iron deficiency anemia, unspecified: Secondary | ICD-10-CM

## 2014-05-18 LAB — COMPREHENSIVE METABOLIC PANEL (CC13)
ALBUMIN: 3.5 g/dL (ref 3.5–5.0)
ALT: 11 U/L (ref 0–55)
ANION GAP: 7 meq/L (ref 3–11)
AST: 17 U/L (ref 5–34)
Alkaline Phosphatase: 67 U/L (ref 40–150)
BUN: 10.5 mg/dL (ref 7.0–26.0)
CO2: 25 meq/L (ref 22–29)
Calcium: 9 mg/dL (ref 8.4–10.4)
Chloride: 107 mEq/L (ref 98–109)
Creatinine: 0.8 mg/dL (ref 0.6–1.1)
Glucose: 103 mg/dl (ref 70–140)
Potassium: 4.2 mEq/L (ref 3.5–5.1)
SODIUM: 139 meq/L (ref 136–145)
TOTAL PROTEIN: 6.2 g/dL — AB (ref 6.4–8.3)
Total Bilirubin: <0.2 mg/dL (ref 0.20–1.20)

## 2014-05-18 LAB — CBC WITH DIFFERENTIAL/PLATELET
BASO%: 1.3 % (ref 0.0–2.0)
Basophils Absolute: 0 10*3/uL (ref 0.0–0.1)
EOS ABS: 0 10*3/uL (ref 0.0–0.5)
EOS%: 0.4 % (ref 0.0–7.0)
HCT: 33 % — ABNORMAL LOW (ref 34.8–46.6)
HGB: 10.5 g/dL — ABNORMAL LOW (ref 11.6–15.9)
LYMPH%: 28.3 % (ref 14.0–49.7)
MCH: 30.8 pg (ref 25.1–34.0)
MCHC: 31.9 g/dL (ref 31.5–36.0)
MCV: 96.6 fL (ref 79.5–101.0)
MONO#: 0.1 10*3/uL (ref 0.1–0.9)
MONO%: 4.3 % (ref 0.0–14.0)
NEUT%: 65.7 % (ref 38.4–76.8)
NEUTROS ABS: 2 10*3/uL (ref 1.5–6.5)
PLATELETS: 311 10*3/uL (ref 145–400)
RBC: 3.42 10*6/uL — AB (ref 3.70–5.45)
RDW: 21.1 % — AB (ref 11.2–14.5)
WBC: 3 10*3/uL — ABNORMAL LOW (ref 3.9–10.3)
lymph#: 0.8 10*3/uL — ABNORMAL LOW (ref 0.9–3.3)

## 2014-05-18 LAB — LACTATE DEHYDROGENASE (CC13): LDH: 135 U/L (ref 125–245)

## 2014-05-18 NOTE — Progress Notes (Signed)
Hematology and Oncology Follow Up Visit  [name] [id number] [birth year] 80 y.o. 05/18/2014 4:14 PM [name], MDJohn, Barbara Oris, MD   Chief Complaint: Left breast cancer.   Principle Diagnosis: Locally advanced cancer of the left breast diagnosed with biopsy on 01/02/2009 although changes of breast cancer were apparently evident in November 2009. There was clinical lymph node involvement. Tumor stage was T4b N1 M0, stage IIIB.  Estrogen receptor was 100%, progesterone receptor 99%. Proliferative index was 45%. HER-2/neu was negative. The patient declined surgery, radiation and chemotherapy.   Prior Therapy: Femara was started on Feb 02, 2009, and continued through 12/14/2012. The patient had an excellent response to Femara; however, in November 2013 there was suggestion of slow progression involving the left breast. Based on progression, Faslodex 500 mg IM started on 12/14/2012 with initial loading dose and then  monthly IM injections. It was discontinued on 02/17/19-15 due to progression.   Current therapy:  Femara 2.5 mg daily plus palbociclib 125 mg daily for 3 weeks on and one week off. .  She had neutropenia and was given granix x 2 on 07/24/ and 07/25 respectively.   Her dose was of palbociclib was decreased to 75 mg daily on 3 weeks on; one week off.   Interim History: [name] was seen today for followup of her locally advanced cancer involving the left breast with diagnosis going back to March 2010.  The patient may have had small pulmonary metastases at the time of diagnosis.  She may also have a small focus of cancer in the right breast as per recent imaging studies.  She was last seen by me on 04/20/2014.    Today, she is accompanied today by her daughter, [name].  She continues to feel entirely well, is asymptomatic, lives alone and is totally independent and active around her home.    She denies any problems relating to her breast, specifically any pain. She does report decreased  intermittent bleeding on her left breast. She also notes a rash starting her cheeks over the past six months.   She feels entirely well.    SMOKING HISTORY: The patient has never smoked cigarettes.  Medications: I have reviewed the patient's current medications.  Current Outpatient Prescriptions  Medication Sig Dispense Refill  . aspirin 81 MG tablet Take 81 mg by mouth daily.       . Cholecalciferol 5000 UNITS TABS Take 5,000 Units by mouth daily.      . Ferrous Sulfate (IRON) 325 (65 FE) MG TABS Take 325 mg by mouth daily.      . irbesartan (AVAPRO) 150 MG tablet Take 1 tablet by mouth 2 (two) times daily.      Marland Kitchen letrozole (FEMARA) 2.5 MG tablet Take 1 tablet (2.5 mg total) by mouth daily.  30 tablet  2  . Multiple Vitamin (MULTIVITAMIN) tablet Take 1 tablet by mouth daily.      . palbociclib (IBRANCE) 75 MG capsule Take whole with food.  Take one tablet by mouth for 3 weeks and off for one week every 28 days.  21 capsule  0  . senna (SENOKOT) 8.6 MG tablet Take 1 tablet by mouth as needed.      . vitamin B-12 (CYANOCOBALAMIN) 100 MCG tablet Take 50 mcg by mouth daily.       No current facility-administered medications for this visit.   Allergies:  Allergies  Allergen Reactions  . Ace Inhibitors Itching  . Amlodipine Nausea Only and Rash  Leg cramps  . Lisinopril Rash   Hematology/Oncology Problem List: 1. Cystic masses involving the right breast with negative biopsy on 01/02/2009.  2. Iron-deficiency anemia requiring hospitalization, 4 units of blood and intravenous iron. The patient had positive stools but a negative colonoscopy on 02/28/2009. Feraheme 510 mg IV was given on 11/06/2011, 11/13/2011, 08/12/2012 and 08/19/2012.   Past Medical History, Surgical history, Social history, and Family History were reviewed and updated.  Review of Systems: Constitutional:  Negative for fever, chills, night sweats, anorexia, weight loss, pain. Cardiovascular: no chest pain or dyspnea on  exertion Respiratory: no cough, shortness of breath, or wheezing Neurological: no TIA or stroke symptoms Dermatological: negative for lumps and skin lesion changes ENT: negative for - epistaxis Skin: Negative. Gastrointestinal: no abdominal pain, change in bowel habits, or black or bloody stools Genito-Urinary: no dysuria, trouble voiding, or hematuria Hematological and Lymphatic: negative for - bleeding problems or bruising Breast: negative for breast lumps Musculoskeletal: negative for - gait disturbance Remaining ROS negative. Physical Exam: Blood pressure 180/92, pulse 75, temperature 97.8 F (36.6 C), temperature source Oral, resp. rate 18, height 5' 2"  (1.575 m), weight 116 lb 12.8 oz (52.98 kg). ECOG: 1 General appearance: alert, cooperative, appears stated age and no distress Head: Normocephalic, without obvious abnormality, atraumatic Neck: no adenopathy, supple, symmetrical, trachea midline and thyroid not enlarged, symmetric, no tenderness/mass/nodules Lymph nodes: Cervical adenopathy: None appreciated and Supraclavicular adenopathy: None appreciated Heart:regular rate and rhythm, S1, S2 normal, no murmur, click, rub or gallop Lung:chest clear, no wheezing, rales, normal symmetric air entry, Heart exam - S1, S2 normal, no murmur, no gallop, rate regular Breast: Left breast reveals continued distortion with retraction of the nipple areolar complex. There continues to be denuded area with crust just tot he left of where the nipple areolar complex would be, measuring nearly 3 x 4 cm. There is some induration superiorly to the location of the nipple areolar complex. Left axilla with some hard axillary lymph node less than 2 cm. The right breast is benign without masses. No adenopathy or palpable lump or dimpling.    Abdomin: soft, non-tender, without masses or organomegaly EXT:No peripheral edema Neuro: No focal deficits  Lab Results: Lab Results  Component Value Date   WBC 3.0*  05/18/2014   HGB 10.5* 05/18/2014   HCT 33.0* 05/18/2014   MCV 96.6 05/18/2014   PLT 311 05/18/2014     Chemistry      Component Value Date/Time   NA 139 05/18/2014 1005   NA 139 03/18/2012 1339   K 4.2 05/18/2014 1005   K 4.3 03/18/2012 1339   CL 105 03/10/2013 1536   CL 105 03/18/2012 1339   CO2 25 05/18/2014 1005   CO2 27 03/18/2012 1339   BUN 10.5 05/18/2014 1005   BUN 13 03/18/2012 1339   CREATININE 0.8 05/18/2014 1005   CREATININE 0.78 03/18/2012 1339      Component Value Date/Time   CALCIUM 9.0 05/18/2014 1005   CALCIUM 9.1 03/18/2012 1339   ALKPHOS 67 05/18/2014 1005   ALKPHOS 96 03/18/2012 1339   AST 17 05/18/2014 1005   AST 18 03/18/2012 1339   ALT 11 05/18/2014 1005   ALT 16 03/18/2012 1339   BILITOT <0.20 05/18/2014 1005   BILITOT 0.3 03/18/2012 1339     Radiological Studies: 1. Chest x-ray on 06/27/2010 showed no acute cardiopulmonary process.  2. Digital diagnostic bilateral mammograms on 07/04/2010 showed the masses in both breasts.  3. Digital diagnostic bilateral mammograms carried  out on 07/31/2011 showed a dense spiculated mass containing central biopsy clip  artifact in the slightly upper outer left breast with associated nipple and skin retraction without significant change since the most recent prior study of 07/04/2010. On mammogram, this mass measures approximately 2.7 x 2.5 x 2.9 cm. No new mass is identified in the left breast. On the right side, the circumscribed more superiorly and posteriorly positioned mass has decreased in size since 07/04/2010. On the mammogram, it currently measures 3.2 x 3.2 x 2.8 cm, whereas previously it measured 4.0 x 3.8 x 3.4 cm. The more irregular mass in the upper central right breast just anterior to the circumscribed mass is without significant change. It measured approximately 2.9 cm in greatest dimension and is felt to be stable. No new masses identified in the right breast.  4. Chest x-ray, 2 view, from 08/05/2012 showed a large hiatal  hernia, but no acute cardiopulmonary disease.  5. Digital diagnostic bilateral mammogram and bilateral breast ultrasound were carried out on 08/12/2012. These studies were compared with the study done from 07/31/2011. There was suggestion of progression. A mass in the left breast that measured 3.3 cm on 11/08 had previously measured 2.6 cm by mammogram. There was another mass seen in the lower central aspect of the left breast measuring 1.1 cm whereas previously on 07/31/2011 it measured 0.6 cm. Ultrasound was carried out on 08/12/2012. The dominant mass in the left breast measured 2.5 x 2.4 x 3.0 cm by ultrasound. The smaller lesion by ultrasound measured 1.1 x 0.7 x 0.9 cm. I do not believe we had an ultrasound last year for comparison. A lymph node was seen in the left axilla measuring 1.1 x 0.9 x 1.4 cm. There was obliteration of the fatty hilum. There were also by ultrasound masses in the right breast. A mass in the superior aspect of the right breast measured 2.4 x 2.6 x 0.6 cm. This mass was located 3 cm from the nipple. A second mass which was located 1 cm from the nipple measured 1.0 x 0.8 x 1.1 cm. It was felt that the known biopsy proven malignancy in the left breast was progressing. The masses in the right breast were decreasing.  6. Mammogram and ultrasound of the left breast from 08/12/2012 suggested progression compared with the prior mammogram of 07/31/2011. The mass which is seen in about the 3 o'clock position now measures about 3.3 cm as compared with 2.6 cm previously. There was another mass seen at around the 6 o'clock position that measures 1.1 cm, whereas previously it measured 0.6 cm. There was also felt to be a lymph node in the left axilla measuring about 1.4 cm in maximum dimension. There was some obliteration of the fatty hilum suggesting that this may, in fact, be replaced by cancer. Ultrasound exam of the right breast showed a mass in the superior aspect measuring 2.4 x 2.6 x 0.6 cm.  The mass is located about 3 cm from the nipple. There was a second mass located about 1 cm from the nipple and this measured about 1.1 cm. The masses  in the right breast have decreased compared with prior studies, whereas the malignancy in the left breast seems to be progressing.  7. PET scan carried out on 09/09/2012 was compared with the prior PET scan from 01/04/2009. Once again there are 2 lesions seen in the left breast. The larger lesion is located at about the 3 o'clock position. There is a smaller lesion at about  6 o'clock which may be new, as well as a left axillary lymph node. There was previously a dumbbell-shaped lesion in the right breast which was quite large. There is now a small residual focus corresponding to the upper part of the previous dumbbell lesion with some increased activity which raises concern that this residual lesion may be malignant. In reviewing the original PET scan from 01/04/2009, it appears that the patient may have had some small pulmonary metastases which had regressed on treatment with Femara. Some of the lesions are still present on the most recent PET scan from 09/09/2012, but clearly are smaller.  8. Digital diagnostic mammogram of the left breast on 12/02/2012 shows progression compared with the prior study from 08/12/2012. The larger lesion at about 3 o'clock in the left breast now is felt to measure 3.8 cm in maximum dimension. The second lesion at about 6 o'clock measures 1.1 cm in maximum dimension.  9. Chest x-ray, 2 view, from 12/02/2012 showed some emphysematous changes, but no acute cardiopulmonary findings. There was a large hiatal hernia. 10. CT Chest w contrast on 03/16/2014 (personally reviewed by me).  Progressive enlargement of dominant left breast mass with multiple new breasts masses consistent with metastases.  Deep mass in the right breast has not significantly changed. Multiple new pulmonary nodules consistent with metastatic  disease.  New hepatic  lesions suspicious for metastatic disease  Impression and Plan:  1. Left breast cancer, Stage IV.  - Ms. Boehlke is doing quite well despite evidence of her progression by CT of chest obtained on 03/16/2014.  Her left breast appears to be smaller with decrease in the size of the mass and less bleeding per patient.   Clinically she is doing superbly well.  --Due to her disease progression, we discontinued faslodex monthly started femara 2.5 mg daily plus palbociclib based on (Finn, 2015). We had an extensive discussion regarding her progression with review of her CT of scan of the chest obtained on 06/12.  She started 125 mg daily for 21 days then 7 days off with her letrozole daily. Due to neutropenia, we decreased her palbociclib to 75 mg daily for 21 days then 7 days off.    --Previously, we discussed extensively the indications, benefits and risks including but not limited to abnormal absolute lymphocyte count, neutropenia, leukopenia, thrombocytopenia, weakness, alopecia and fatigue and nausea. She understood these risks and agree to proceed (consent in the chart for palbociclib).  Median progression-free survival was 10.2 months (95% CI 5.7-12.6) for the letrozole group and 20.2 months (13.8-27.5) for the palbociclib plus letrozole group.   --We will monitor CBC bi-weekly and return for symptom visits every month. She does not want to have chemotherapy.  If further further progression we may consider exemestane or tamoxifen.   -- On prior visits, she did see Dr. Eppie Gibson for a radiation oncology consultation on 12/30/2012.  Dr. Isidore Moos was not in favor of palliative radiation; instead recommended a mastectomy if the patient should need that.  Patient is not at all enthusiastic about having a mastectomy.  Hopefully that will not be necessary.  - Her tumor markers are stable with an overall mild increase.  We will repeat monthly to establish trend. She requests not to repeat her mammogram.  Thus,  we will order a CT of chest following two months of therapy with femara plus palbociclib. She will return biweekly for CBC for the first two months of therapy.  She has an appointment to return in 1  month and she will have CBC, chemistries, CEA and CA27.29.  2. Pancytopenia due to #1.  --Her counts are stable with a WBC of 3.0; ANC of 2.0; Hgb of 10.5 and plts of 311 (up from 55 three weeks ago). She is on day #8 of cycle #2 of palbociclib (dose reduced as detailed above).  For further modification, we will consider a 2 week on treatment followed by one week. We will await her next CBC in 2 weeks.   All questions were answered. The patient knows to call the clinic with any problems, questions or concerns. We can certainly see the patient much sooner if necessary.   I spent 15 minutes counseling the patient face to face. The total time spent in the appointment was 25 minutes.  Rodney Yera, MD 8/14/20154:14 PM

## 2014-05-19 LAB — CANCER ANTIGEN 27.29: CA 27.29: 27 U/mL (ref 0–39)

## 2014-05-21 ENCOUNTER — Telehealth: Payer: Self-pay | Admitting: Internal Medicine

## 2014-05-21 NOTE — Telephone Encounter (Signed)
S/w pt's daughter, gave appts 8/28 + 9/11.

## 2014-05-22 ENCOUNTER — Telehealth: Payer: Self-pay

## 2014-05-22 ENCOUNTER — Other Ambulatory Visit: Payer: Self-pay | Admitting: Internal Medicine

## 2014-05-22 MED ORDER — CEPHALEXIN 500 MG PO CAPS: 500.0000 mg | ORAL_CAPSULE | Freq: Two times a day (BID) | ORAL | Status: DC

## 2014-05-22 NOTE — Telephone Encounter (Signed)
[name] called stating she remembered that Dr Ralene Ok had prescribed an antibiotic in the past when the pt's skin had a similar problem of rash and itching. Asked if this could be an option at this time. The Dermatologist appt is not until next Friday. Dr Juliann Mule concurred and rx e-scribed to [facility]. Called robyn back and let her know

## 2014-06-01 ENCOUNTER — Other Ambulatory Visit (HOSPITAL_BASED_OUTPATIENT_CLINIC_OR_DEPARTMENT_OTHER): Payer: Medicare Other

## 2014-06-01 DIAGNOSIS — D61818 Other pancytopenia: Secondary | ICD-10-CM

## 2014-06-01 DIAGNOSIS — D709 Neutropenia, unspecified: Secondary | ICD-10-CM

## 2014-06-01 DIAGNOSIS — C50112 Malignant neoplasm of central portion of left female breast: Secondary | ICD-10-CM

## 2014-06-01 DIAGNOSIS — C50419 Malignant neoplasm of upper-outer quadrant of unspecified female breast: Secondary | ICD-10-CM

## 2014-06-01 DIAGNOSIS — C773 Secondary and unspecified malignant neoplasm of axilla and upper limb lymph nodes: Secondary | ICD-10-CM

## 2014-06-01 DIAGNOSIS — D509 Iron deficiency anemia, unspecified: Secondary | ICD-10-CM

## 2014-06-01 LAB — CBC WITH DIFFERENTIAL/PLATELET
BASO%: 2.7 % — ABNORMAL HIGH (ref 0.0–2.0)
Basophils Absolute: 0.1 10*3/uL (ref 0.0–0.1)
EOS ABS: 0 10*3/uL (ref 0.0–0.5)
EOS%: 0.5 % (ref 0.0–7.0)
HCT: 31.2 % — ABNORMAL LOW (ref 34.8–46.6)
HGB: 10.3 g/dL — ABNORMAL LOW (ref 11.6–15.9)
LYMPH%: 47 % (ref 14.0–49.7)
MCH: 31.9 pg (ref 25.1–34.0)
MCHC: 33 g/dL (ref 31.5–36.0)
MCV: 96.6 fL (ref 79.5–101.0)
MONO#: 0.2 10*3/uL (ref 0.1–0.9)
MONO%: 8.2 % (ref 0.0–14.0)
NEUT%: 41.6 % (ref 38.4–76.8)
NEUTROS ABS: 0.8 10*3/uL — AB (ref 1.5–6.5)
PLATELETS: 60 10*3/uL — AB (ref 145–400)
RBC: 3.23 10*6/uL — ABNORMAL LOW (ref 3.70–5.45)
RDW: 19.7 % — AB (ref 11.2–14.5)
WBC: 1.8 10*3/uL — ABNORMAL LOW (ref 3.9–10.3)
lymph#: 0.9 10*3/uL (ref 0.9–3.3)

## 2014-06-02 LAB — CEA: CEA: 3.2 ng/mL (ref 0.0–5.0)

## 2014-06-02 LAB — CANCER ANTIGEN 27.29: CA 27.29: 22 U/mL (ref 0–39)

## 2014-06-06 ENCOUNTER — Other Ambulatory Visit: Payer: Self-pay | Admitting: Internal Medicine

## 2014-06-07 ENCOUNTER — Telehealth: Payer: Self-pay

## 2014-06-07 ENCOUNTER — Other Ambulatory Visit: Payer: Self-pay | Admitting: Hematology

## 2014-06-07 NOTE — Telephone Encounter (Signed)
lvm home and w/[name], hold ibrance until next appt 9/11, neutropenic precautions reinforced.

## 2014-06-08 ENCOUNTER — Other Ambulatory Visit: Payer: Self-pay | Admitting: Internal Medicine

## 2014-06-08 ENCOUNTER — Encounter: Payer: Self-pay | Admitting: *Deleted

## 2014-06-08 NOTE — Progress Notes (Signed)
NOTIFIED [facility] THAT PT.'S IBRANCE IS ON HOLD UNTIL 06/15/14 APPOINTMENT.

## 2014-06-15 ENCOUNTER — Other Ambulatory Visit (HOSPITAL_BASED_OUTPATIENT_CLINIC_OR_DEPARTMENT_OTHER): Payer: Medicare Other

## 2014-06-15 ENCOUNTER — Other Ambulatory Visit: Payer: Self-pay

## 2014-06-15 ENCOUNTER — Telehealth: Payer: Self-pay | Admitting: Hematology

## 2014-06-15 ENCOUNTER — Ambulatory Visit (HOSPITAL_BASED_OUTPATIENT_CLINIC_OR_DEPARTMENT_OTHER): Payer: Medicare Other | Admitting: Hematology

## 2014-06-15 VITALS — BP 179/95 | HR 87 | Temp 97.6°F | Resp 19 | Ht 62.0 in | Wt 114.1 lb

## 2014-06-15 DIAGNOSIS — C50919 Malignant neoplasm of unspecified site of unspecified female breast: Secondary | ICD-10-CM

## 2014-06-15 DIAGNOSIS — C50419 Malignant neoplasm of upper-outer quadrant of unspecified female breast: Secondary | ICD-10-CM | POA: Diagnosis not present

## 2014-06-15 DIAGNOSIS — C50112 Malignant neoplasm of central portion of left female breast: Secondary | ICD-10-CM

## 2014-06-15 DIAGNOSIS — C50119 Malignant neoplasm of central portion of unspecified female breast: Secondary | ICD-10-CM

## 2014-06-15 DIAGNOSIS — C50912 Malignant neoplasm of unspecified site of left female breast: Secondary | ICD-10-CM

## 2014-06-15 DIAGNOSIS — D509 Iron deficiency anemia, unspecified: Secondary | ICD-10-CM

## 2014-06-15 DIAGNOSIS — C7800 Secondary malignant neoplasm of unspecified lung: Secondary | ICD-10-CM | POA: Diagnosis not present

## 2014-06-15 DIAGNOSIS — D709 Neutropenia, unspecified: Secondary | ICD-10-CM

## 2014-06-15 DIAGNOSIS — C773 Secondary and unspecified malignant neoplasm of axilla and upper limb lymph nodes: Secondary | ICD-10-CM | POA: Diagnosis not present

## 2014-06-15 LAB — CBC WITH DIFFERENTIAL/PLATELET
BASO%: 1.1 % (ref 0.0–2.0)
BASOS ABS: 0 10*3/uL (ref 0.0–0.1)
EOS%: 0.6 % (ref 0.0–7.0)
Eosinophils Absolute: 0 10*3/uL (ref 0.0–0.5)
HCT: 33.3 % — ABNORMAL LOW (ref 34.8–46.6)
HEMOGLOBIN: 11 g/dL — AB (ref 11.6–15.9)
LYMPH%: 28.3 % (ref 14.0–49.7)
MCH: 32.4 pg (ref 25.1–34.0)
MCHC: 33 g/dL (ref 31.5–36.0)
MCV: 98.2 fL (ref 79.5–101.0)
MONO#: 0.4 10*3/uL (ref 0.1–0.9)
MONO%: 12.5 % (ref 0.0–14.0)
NEUT#: 2 10*3/uL (ref 1.5–6.5)
NEUT%: 57.5 % (ref 38.4–76.8)
Platelets: 156 10*3/uL (ref 145–400)
RBC: 3.39 10*6/uL — ABNORMAL LOW (ref 3.70–5.45)
RDW: 19.7 % — ABNORMAL HIGH (ref 11.2–14.5)
WBC: 3.5 10*3/uL — ABNORMAL LOW (ref 3.9–10.3)
lymph#: 1 10*3/uL (ref 0.9–3.3)

## 2014-06-15 LAB — COMPREHENSIVE METABOLIC PANEL (CC13)
ALK PHOS: 73 U/L (ref 40–150)
ALT: 9 U/L (ref 0–55)
AST: 21 U/L (ref 5–34)
Albumin: 3.4 g/dL — ABNORMAL LOW (ref 3.5–5.0)
Anion Gap: 8 mEq/L (ref 3–11)
BUN: 8.8 mg/dL (ref 7.0–26.0)
CO2: 24 mEq/L (ref 22–29)
CREATININE: 0.8 mg/dL (ref 0.6–1.1)
Calcium: 8.4 mg/dL (ref 8.4–10.4)
Chloride: 106 mEq/L (ref 98–109)
Glucose: 114 mg/dl (ref 70–140)
Potassium: 4.1 mEq/L (ref 3.5–5.1)
Sodium: 138 mEq/L (ref 136–145)
Total Bilirubin: 0.29 mg/dL (ref 0.20–1.20)
Total Protein: 6.2 g/dL — ABNORMAL LOW (ref 6.4–8.3)

## 2014-06-15 LAB — LACTATE DEHYDROGENASE (CC13): LDH: 140 U/L (ref 125–245)

## 2014-06-15 MED ORDER — PALBOCICLIB 75 MG PO CAPS: ORAL_CAPSULE | ORAL | Status: DC

## 2014-06-15 MED ORDER — LETROZOLE 2.5 MG PO TABS: 2.5000 mg | ORAL_TABLET | Freq: Every day | ORAL | Status: DC

## 2014-06-15 MED ORDER — TBO-FILGRASTIM 300 MCG/0.5ML ~~LOC~~ SOSY
300.0000 ug | PREFILLED_SYRINGE | Freq: Once | SUBCUTANEOUS | Status: AC
  Administered 2014-06-15: 300 ug via SUBCUTANEOUS
  Filled 2014-06-15: qty 0.5

## 2014-06-15 NOTE — Telephone Encounter (Signed)
lvm for pt regarding to OCT appt ...mailed pt appt sched/avs and letter °

## 2014-06-16 ENCOUNTER — Encounter: Payer: Self-pay | Admitting: Hematology

## 2014-06-16 NOTE — Progress Notes (Signed)
Hematology and Oncology Follow Up Visit Date of Visit: 06/15/2014  [name] [id number] 05-29-1922 80 y.o.  CC: [name], MDJohn, Barbara Oris, MD   Chief Complaint: Left breast cancer.   Principal Diagnosis: Locally advanced cancer of the left breast diagnosed with biopsy on 01/02/2009 although changes of breast cancer were apparently evident in November 2009. There was clinical lymph node involvement. Tumor stage was T4b N1 M0, stage IIIB.  Estrogen receptor was 100%, progesterone receptor 99%. Proliferative index was 45%. HER-2/neu was negative. The patient declined surgery, radiation and chemotherapy.   Prior Therapy: Femara was started on Feb 02, 2009, and continued through 12/14/2012. The patient had an excellent response to Femara; however, in November 2013 there was suggestion of slow progression involving the left breast. Based on progression, Faslodex 500 mg IM started on 12/14/2012 with initial loading dose and then  monthly IM injections. It was discontinued on 02/16/2014 due to progression.   Current therapy:  Femara 2.5 mg daily plus palbociclib 75 mg daily for 2 weeks on and one week off (dose modified because of neutropenia). .  She had neutropenia and was given granix x 2 on 07/24/ and 07/25 respectively.   Her dose was of palbociclib was decreased to 75 mg daily on 2 weeks on; one week off. Patient has been on Palbociclib since June 2015.  Interim History: [name] was seen today for followup of her locally advanced cancer involving the left breast with diagnosis going back to March 2010.  The patient may have had small pulmonary metastases at the time of diagnosis.  She may also have a small focus of cancer in the right breast as per recent imaging studies.  She was last seen by Dr Juliann Mule on 05/18/2014. 04/20/2014.    Today, she is accompanied today by her daughter, [name].  She continues to feel entirely well, is asymptomatic, lives alone and is totally independent and active around  her home.    She denies any problems relating to her breast, specifically any pain. She does report decreased intermittent bleeding on her left breast. She is saying that since she started the Ibrance (palbociclib), she has noticed that the breast mass has shrunk, does not bleed that much and is softer.     SMOKING HISTORY: The patient has never smoked cigarettes.  Medications: I have reviewed the patient's current medications.  Current Outpatient Prescriptions  Medication Sig Dispense Refill  . aspirin 81 MG tablet Take 81 mg by mouth daily.       . cephALEXin (KEFLEX) 500 MG capsule Take 1 capsule (500 mg total) by mouth 2 (two) times daily.  20 capsule  0  . Cholecalciferol 5000 UNITS TABS Take 5,000 Units by mouth daily.      . Ferrous Sulfate (IRON) 325 (65 FE) MG TABS Take 325 mg by mouth daily.      . irbesartan (AVAPRO) 150 MG tablet Take 1 tablet by mouth 2 (two) times daily.      Marland Kitchen letrozole (FEMARA) 2.5 MG tablet Take 1 tablet (2.5 mg total) by mouth daily.  90 tablet  2  . Multiple Vitamin (MULTIVITAMIN) tablet Take 1 tablet by mouth daily.      Marland Kitchen senna (SENOKOT) 8.6 MG tablet Take 1 tablet by mouth as needed.      . vitamin B-12 (CYANOCOBALAMIN) 100 MCG tablet Take 50 mcg by mouth daily.      . palbociclib (IBRANCE) 75 MG capsule Take whole with food.  Take one tablet  by mouth for 2 weeks and off for one week every 21 days.  14 capsule  5   No current facility-administered medications for this visit.   Allergies:  Allergies  Allergen Reactions  . Ace Inhibitors Itching  . Amlodipine Nausea Only and Rash    Leg cramps  . Lisinopril Rash   Hematology/Oncology Problem List: 1. Cystic masses involving the right breast with negative biopsy on 01/02/2009.  2. Iron-deficiency anemia requiring hospitalization, 4 units of blood and intravenous iron. The patient had positive stools but a negative colonoscopy on 02/28/2009. Feraheme 510 mg IV was given on 11/06/2011, 11/13/2011,  08/12/2012 and 08/19/2012.   Past Medical History, Surgical history, Social history, and Family History were reviewed and updated.  Review of Systems: Constitutional:  Negative for fever, chills, night sweats, anorexia, weight loss, pain. Cardiovascular: no chest pain or dyspnea on exertion Respiratory: no cough, shortness of breath, or wheezing Neurological: no TIA or stroke symptoms Dermatological: negative for lumps and skin lesion changes ENT: negative for - epistaxis Skin: Negative. Gastrointestinal: no abdominal pain, change in bowel habits, or black or bloody stools Genito-Urinary: no dysuria, trouble voiding, or hematuria Hematological and Lymphatic: negative for - bleeding problems or bruising Breast: negative for breast lumps Musculoskeletal: negative for - gait disturbance Remaining ROS negative.  Physical Exam: Blood pressure 179/95, pulse 87, temperature 97.6 F (36.4 C), temperature source Oral, resp. rate 19, height 5' 2"  (1.575 m), weight 114 lb 1.6 oz (51.755 kg). ECOG: 1 General appearance: alert, cooperative, appears stated age and no distress Head: Normocephalic, without obvious abnormality, atraumatic Neck: no adenopathy, supple, symmetrical, trachea midline and thyroid not enlarged, symmetric, no tenderness/mass/nodules Lymph nodes: Cervical adenopathy: None appreciated and Supraclavicular adenopathy: None appreciated Heart:regular rate and rhythm, S1, S2 normal, no murmur, click, rub or gallop Lung:chest clear, no wheezing, rales, normal symmetric air entry, Heart exam - S1, S2 normal, no murmur, no gallop, rate regular Breast: Left breast reveals continued distortion with retraction of the nipple areolar complex. There continues to be denuded area with crust just tot he left of where the nipple areolar complex would be, measuring nearly 3 x 4 cm. There is some induration superiorly to the location of the nipple areolar complex. Left axilla with some hard axillary  lymph node less than 2 cm. The right breast is benign without masses. No adenopathy or palpable lump or dimpling.    Abdomin: soft, non-tender, without masses or organomegaly EXT:No peripheral edema Neuro: No focal deficits  Lab Results:              Radiological Studies: 1. Chest x-ray on 06/27/2010 showed no acute cardiopulmonary process.  2. Digital diagnostic bilateral mammograms on 07/04/2010 showed the masses in both breasts.  3. Digital diagnostic bilateral mammograms carried out on 07/31/2011 showed a dense spiculated mass containing central biopsy clip  artifact in the slightly upper outer left breast with associated nipple and skin retraction without significant change since the most recent prior study of 07/04/2010. On mammogram, this mass measures approximately 2.7 x 2.5 x 2.9 cm. No new mass is identified in the left breast. On the right side, the circumscribed more superiorly and posteriorly positioned mass has decreased in size since 07/04/2010. On the mammogram, it currently measures 3.2 x 3.2 x 2.8 cm, whereas previously it measured 4.0 x 3.8 x 3.4 cm. The more irregular mass in the upper central right breast just anterior to the circumscribed mass is without significant change. It measured approximately 2.9 cm  in greatest dimension and is felt to be stable. No new masses identified in the right breast.  4. Chest x-ray, 2 view, from 08/05/2012 showed a large hiatal hernia, but no acute cardiopulmonary disease.  5. Digital diagnostic bilateral mammogram and bilateral breast ultrasound were carried out on 08/12/2012. These studies were compared with the study done from 07/31/2011. There was suggestion of progression. A mass in the left breast that measured 3.3 cm on 11/08 had previously measured 2.6 cm by mammogram. There was another mass seen in the lower central aspect of the left breast measuring 1.1 cm whereas previously on 07/31/2011 it measured 0.6 cm. Ultrasound was  carried out on 08/12/2012. The dominant mass in the left breast measured 2.5 x 2.4 x 3.0 cm by ultrasound. The smaller lesion by ultrasound measured 1.1 x 0.7 x 0.9 cm. I do not believe we had an ultrasound last year for comparison. A lymph node was seen in the left axilla measuring 1.1 x 0.9 x 1.4 cm. There was obliteration of the fatty hilum. There were also by ultrasound masses in the right breast. A mass in the superior aspect of the right breast measured 2.4 x 2.6 x 0.6 cm. This mass was located 3 cm from the nipple. A second mass which was located 1 cm from the nipple measured 1.0 x 0.8 x 1.1 cm. It was felt that the known biopsy proven malignancy in the left breast was progressing. The masses in the right breast were decreasing.  6. Mammogram and ultrasound of the left breast from 08/12/2012 suggested progression compared with the prior mammogram of 07/31/2011. The mass which is seen in about the 3 o'clock position now measures about 3.3 cm as compared with 2.6 cm previously. There was another mass seen at around the 6 o'clock position that measures 1.1 cm, whereas previously it measured 0.6 cm. There was also felt to be a lymph node in the left axilla measuring about 1.4 cm in maximum dimension. There was some obliteration of the fatty hilum suggesting that this may, in fact, be replaced by cancer. Ultrasound exam of the right breast showed a mass in the superior aspect measuring 2.4 x 2.6 x 0.6 cm. The mass is located about 3 cm from the nipple. There was a second mass located about 1 cm from the nipple and this measured about 1.1 cm. The masses  in the right breast have decreased compared with prior studies, whereas the malignancy in the left breast seems to be progressing.  7. PET scan carried out on 09/09/2012 was compared with the prior PET scan from 01/04/2009. Once again there are 2 lesions seen in the left breast. The larger lesion is located at about the 3 o'clock position. There is a smaller  lesion at about 6 o'clock which may be new, as well as a left axillary lymph node. There was previously a dumbbell-shaped lesion in the right breast which was quite large. There is now a small residual focus corresponding to the upper part of the previous dumbbell lesion with some increased activity which raises concern that this residual lesion may be malignant. In reviewing the original PET scan from 01/04/2009, it appears that the patient may have had some small pulmonary metastases which had regressed on treatment with Femara. Some of the lesions are still present on the most recent PET scan from 09/09/2012, but clearly are smaller.  8. Digital diagnostic mammogram of the left breast on 12/02/2012 shows progression compared with the prior study from  08/12/2012. The larger lesion at about 3 o'clock in the left breast now is felt to measure 3.8 cm in maximum dimension. The second lesion at about 6 o'clock measures 1.1 cm in maximum dimension.  9. Chest x-ray, 2 view, from 12/02/2012 showed some emphysematous changes, but no acute cardiopulmonary findings. There was a large hiatal hernia. 10. CT Chest w contrast on 03/16/2014 (personally reviewed by me).  Progressive enlargement of dominant left breast mass with multiple new breasts masses consistent with metastases.  Deep mass in the right breast has not significantly changed. Multiple new pulmonary nodules consistent with metastatic disease.  New hepatic lesions suspicious for metastatic disease                       Impression and Plan:  1. Left breast cancer, Stage IV.   - Ms. Fein is doing quite well despite evidence of her progression by CT of chest obtained on 03/16/2014.  Her left breast appears to be smaller with decrease in the size of the mass and less bleeding per patient.   Clinically she is doing superbly well.  --Due to her disease progression, we discontinued faslodex monthly started femara 2.5 mg daily plus  palbociclib based on (Finn, 2015). We had an extensive discussion regarding her progression with review of her CT of scan of the chest obtained on 06/12.  She started 125 mg daily for 21 days then 7 days off with her letrozole daily. Due to neutropenia, we decreased her palbociclib to 75 mg daily for 14 days then 7 days off (3 week cycle of palbociclib and femara is continuous). She starts it again on 06/18/2014. Script was send to her pharmacy. She was given 1 dose of Granix 300 mcg sq today to allow further increase in her white count. Today her [place] was  2000 up from 800.  --Previously, we discussed extensively the indications, benefits and risks including but not limited to abnormal absolute lymphocyte count, neutropenia, leukopenia, thrombocytopenia, weakness, alopecia and fatigue and nausea. She understood these risks and agree to proceed (consent in the chart for palbociclib).  Median progression-free survival was 10.2 months (95% CI 5.7-12.6) for the letrozole group and 20.2 months (13.8-27.5) for the palbociclib plus letrozole group.   --We will follow her labs again on 07/06/2014 and have a MD visit. She does not want to have chemotherapy.  If further further progression we may consider exemestane or tamoxifen.   -- On prior visits, she did see Dr. Eppie Gibson for a radiation oncology consultation on 12/30/2012.  Dr. Isidore Moos was not in favor of palliative radiation; instead recommended a mastectomy if the patient should need that.  Patient is not at all enthusiastic about having a mastectomy.  Hopefully that will not be necessary.  All questions were answered. The patient knows to call the clinic with any problems, questions or concerns. We can certainly see the patient much sooner if necessary.   I spent 30 minutes counseling the patient face to face. The total time spent in the appointment was 35 minutes.  Bernadene Bell, MD Medical Hematologist/Oncologist [place] Pager:  [phone] Office No: [phone]

## 2014-06-22 ENCOUNTER — Telehealth: Payer: Self-pay | Admitting: Hematology

## 2014-06-22 NOTE — Telephone Encounter (Signed)
pt dtr robin lmonvm re r/s 10/1 appts. per dtr appts must be fridays. appt r/s for 10/9 @ 10am lb/AS. lmonvm for dtr robin (info in epic) and mailed schedule.

## 2014-07-05 ENCOUNTER — Other Ambulatory Visit: Payer: Medicare Other

## 2014-07-05 ENCOUNTER — Ambulatory Visit: Payer: Medicare Other

## 2014-07-12 ENCOUNTER — Other Ambulatory Visit: Payer: Self-pay | Admitting: Internal Medicine

## 2014-07-13 ENCOUNTER — Telehealth: Payer: Self-pay | Admitting: Hematology

## 2014-07-13 ENCOUNTER — Encounter: Payer: Self-pay | Admitting: Hematology

## 2014-07-13 ENCOUNTER — Ambulatory Visit (HOSPITAL_BASED_OUTPATIENT_CLINIC_OR_DEPARTMENT_OTHER): Payer: Medicare Other | Admitting: Hematology

## 2014-07-13 ENCOUNTER — Other Ambulatory Visit (HOSPITAL_BASED_OUTPATIENT_CLINIC_OR_DEPARTMENT_OTHER): Payer: Medicare Other

## 2014-07-13 VITALS — BP 199/80 | HR 79 | Temp 98.4°F | Resp 17 | Ht 62.0 in | Wt 113.2 lb

## 2014-07-13 DIAGNOSIS — C773 Secondary and unspecified malignant neoplasm of axilla and upper limb lymph nodes: Secondary | ICD-10-CM

## 2014-07-13 DIAGNOSIS — D702 Other drug-induced agranulocytosis: Secondary | ICD-10-CM | POA: Diagnosis not present

## 2014-07-13 DIAGNOSIS — C50412 Malignant neoplasm of upper-outer quadrant of left female breast: Secondary | ICD-10-CM

## 2014-07-13 DIAGNOSIS — C7800 Secondary malignant neoplasm of unspecified lung: Secondary | ICD-10-CM

## 2014-07-13 DIAGNOSIS — C50919 Malignant neoplasm of unspecified site of unspecified female breast: Secondary | ICD-10-CM

## 2014-07-13 DIAGNOSIS — C50112 Malignant neoplasm of central portion of left female breast: Secondary | ICD-10-CM

## 2014-07-13 DIAGNOSIS — C50912 Malignant neoplasm of unspecified site of left female breast: Secondary | ICD-10-CM

## 2014-07-13 DIAGNOSIS — Z170 Estrogen receptor positive status [ER+]: Secondary | ICD-10-CM

## 2014-07-13 LAB — COMPREHENSIVE METABOLIC PANEL (CC13)
ALK PHOS: 69 U/L (ref 40–150)
ALT: 13 U/L (ref 0–55)
AST: 20 U/L (ref 5–34)
Albumin: 3.4 g/dL — ABNORMAL LOW (ref 3.5–5.0)
Anion Gap: 5 mEq/L (ref 3–11)
BUN: 11.1 mg/dL (ref 7.0–26.0)
CALCIUM: 8.9 mg/dL (ref 8.4–10.4)
CHLORIDE: 105 meq/L (ref 98–109)
CO2: 28 mEq/L (ref 22–29)
CREATININE: 0.7 mg/dL (ref 0.6–1.1)
Glucose: 101 mg/dl (ref 70–140)
Potassium: 4.3 mEq/L (ref 3.5–5.1)
Sodium: 138 mEq/L (ref 136–145)
Total Bilirubin: 0.26 mg/dL (ref 0.20–1.20)
Total Protein: 6.3 g/dL — ABNORMAL LOW (ref 6.4–8.3)

## 2014-07-13 LAB — CBC WITH DIFFERENTIAL/PLATELET
BASO%: 2.3 % — ABNORMAL HIGH (ref 0.0–2.0)
BASOS ABS: 0.1 10*3/uL (ref 0.0–0.1)
EOS%: 0.5 % (ref 0.0–7.0)
Eosinophils Absolute: 0 10*3/uL (ref 0.0–0.5)
HCT: 32 % — ABNORMAL LOW (ref 34.8–46.6)
HEMOGLOBIN: 10.6 g/dL — AB (ref 11.6–15.9)
LYMPH%: 38.5 % (ref 14.0–49.7)
MCH: 33.3 pg (ref 25.1–34.0)
MCHC: 33.1 g/dL (ref 31.5–36.0)
MCV: 100.6 fL (ref 79.5–101.0)
MONO#: 0.4 10*3/uL (ref 0.1–0.9)
MONO%: 18.6 % — ABNORMAL HIGH (ref 0.0–14.0)
NEUT#: 0.9 10*3/uL — ABNORMAL LOW (ref 1.5–6.5)
NEUT%: 40.1 % (ref 38.4–76.8)
PLATELETS: 119 10*3/uL — AB (ref 145–400)
RBC: 3.18 10*6/uL — ABNORMAL LOW (ref 3.70–5.45)
RDW: 18.2 % — ABNORMAL HIGH (ref 11.2–14.5)
WBC: 2.2 10*3/uL — ABNORMAL LOW (ref 3.9–10.3)
lymph#: 0.9 10*3/uL (ref 0.9–3.3)

## 2014-07-13 MED ORDER — TBO-FILGRASTIM 480 MCG/0.8ML ~~LOC~~ SOSY
480.0000 ug | PREFILLED_SYRINGE | Freq: Every day | SUBCUTANEOUS | Status: DC
  Administered 2014-07-13: 480 ug via SUBCUTANEOUS
  Filled 2014-07-13: qty 0.8

## 2014-07-13 MED ORDER — FILGRASTIM 480 MCG/0.8ML IJ SOLN: 480.0000 ug | Freq: Once | INTRAMUSCULAR | Status: DC

## 2014-07-13 NOTE — Patient Instructions (Signed)

## 2014-07-13 NOTE — Telephone Encounter (Signed)
[facility] PT DTR APPT SCHEDULE FOR OCT. PER AS NO INF 10/30 - [facility] 10/16 ONLY. S/W KAREN IN [place]. KAREN WILL GET W/DR Isidore Moos RE SEEING PT 10/13. KAREN WILL CONTACT PT - PT/DTR AWARE AND NUMBER CONFIRMED PRIOR TO LEAVING.

## 2014-07-13 NOTE — Progress Notes (Signed)
Hematology and Oncology Follow Up Visit Date of Visit: 07/13/2014  [name] [id number] 1922/06/04 80 y.o.  CC: [name], MDJohn, Barbara Oris, MD   Chief Complaint:   1. Left breast cancer causing bleeding. 2. Neutropenia (due to Ibrance despite dose reduction)   Principal Diagnosis: Locally advanced cancer of the left breast diagnosed with biopsy on 01/02/2009 although changes of breast cancer were apparently evident in November 2009. There was clinical lymph node involvement. Tumor stage was T4b N1 M0, stage IIIB.  Estrogen receptor was 100%, progesterone receptor 99%. Proliferative index was 45%. HER-2/neu was negative. The patient declined surgery, radiation and chemotherapy.   Prior Therapy: Femara was started on Feb 02, 2009, and continued through 12/14/2012. The patient had an excellent response to Femara; however, in November 2013 there was suggestion of slow progression involving the left breast. Based on progression, Faslodex 500 mg IM started on 12/14/2012 with initial loading dose and then  monthly IM injections. It was discontinued on 02/16/2014 due to progression.   Current therapy:  Femara 2.5 mg daily plus palbociclib 75 mg daily for 2 weeks on and one week off (dose modified because of neutropenia). .  She had neutropenia and was given granix x 2 on 07/24/ and 07/25 respectively.   Her dose was of palbociclib was decreased to 75 mg daily on 2 weeks on; one week off. Patient has been on Palbociclib since June 2015. Currently held again as her Epes 900 today and she will get two doses of G-CSF today and tomorrow.  Interim History: [name] was seen today for followup of her locally advanced cancer involving the left breast with diagnosis going back to March 2010.  The patient may have had small pulmonary metastases at the time of diagnosis.  She may also have a small focus of cancer in the right breast as per recent imaging studies.  She was last seen by Dr Juliann Mule on 05/18/2014.  04/20/2014.    Today, she is accompanied today by her daughter, [name].  She continues to feel entirely well, is asymptomatic, lives alone and is totally independent and active around her home.    She is c/o left breast having bloody discharge and more so in the morning. She dresses that twice a day and wash with alcohol. She does report decreased intermittent bleeding on her left breast only when she is on Ibrance. She is saying that since she started the Ibrance (palbociclib), she has noticed that the breast mass has shrunk, does not bleed that much and is softer. I spoke with Dr Isidore Moos today and SHE WILL SEE PATIENT TO CONSIDER A short course of palliative XRT to breast to reduce the bleeding.   SMOKING HISTORY: The patient has never smoked cigarettes.  Medications: I have reviewed the patient's current medications.  Current Outpatient Prescriptions  Medication Sig Dispense Refill  . aspirin 81 MG tablet Take 81 mg by mouth daily.       . cephALEXin (KEFLEX) 500 MG capsule Take 1 capsule (500 mg total) by mouth 2 (two) times daily.  20 capsule  0  . Cholecalciferol 5000 UNITS TABS Take 5,000 Units by mouth daily.      . Ferrous Sulfate (IRON) 325 (65 FE) MG TABS Take 325 mg by mouth daily.      . irbesartan (AVAPRO) 150 MG tablet Take 1 tablet by mouth 2 (two) times daily.      Marland Kitchen letrozole (FEMARA) 2.5 MG tablet Take 1 tablet (2.5 mg total) by  mouth daily.  90 tablet  2  . Multiple Vitamin (MULTIVITAMIN) tablet Take 1 tablet by mouth daily.      Marland Kitchen senna (SENOKOT) 8.6 MG tablet Take 1 tablet by mouth as needed.      . vitamin B-12 (CYANOCOBALAMIN) 100 MCG tablet Take 50 mcg by mouth daily.      . palbociclib (IBRANCE) 75 MG capsule Take whole with food.  Take one tablet by mouth for 2 weeks and off for one week every 21 days.  14 capsule  5   Current Facility-Administered Medications  Medication Dose Route Frequency Provider Last Rate Last Dose  . Tbo-Filgrastim (GRANIX) injection 480 mcg  480  mcg Subcutaneous Daily Sharayah Renfrow Marla Roe, MD   480 mcg at 07/13/14 1124   Allergies:  Allergies  Allergen Reactions  . Ace Inhibitors Itching  . Amlodipine Nausea Only and Rash    Leg cramps  . Lisinopril Rash   Hematology/Oncology Problem List: 1. Cystic masses involving the right breast with negative biopsy on 01/02/2009.  2. Iron-deficiency anemia requiring hospitalization, 4 units of blood and intravenous iron. The patient had positive stools but a negative colonoscopy on 02/28/2009. Feraheme 510 mg IV was given on 11/06/2011, 11/13/2011, 08/12/2012 and 08/19/2012.   Past Medical History, Surgical history, Social history, and Family History were reviewed and updated.  Review of Systems: Constitutional:  Negative for fever, chills, night sweats, anorexia, weight loss, pain. Cardiovascular: no chest pain or dyspnea on exertion Respiratory: no cough, shortness of breath, or wheezing Neurological: no TIA or stroke symptoms Dermatological: negative for lumps and skin lesion changes ENT: negative for - epistaxis Skin: Negative. Gastrointestinal: no abdominal pain, change in bowel habits, or black or bloody stools Genito-Urinary: no dysuria, trouble voiding, or hematuria Hematological and Lymphatic: negative for - bleeding problems or bruising Breast: negative for breast lumps Musculoskeletal: negative for - gait disturbance Remaining ROS negative.  Physical Exam: Blood pressure 199/80, pulse 79, temperature 98.4 F (36.9 C), temperature source Oral, resp. rate 17, height 5' 2"  (1.575 m), weight 113 lb 3.2 oz (51.347 kg), SpO2 99.00%. ECOG: 1 General appearance: alert, cooperative, appears stated age and no distress Head: Normocephalic, without obvious abnormality, atraumatic Neck: no adenopathy, supple, symmetrical, trachea midline and thyroid not enlarged, symmetric, no tenderness/mass/nodules Lymph nodes: Cervical adenopathy: None appreciated and Supraclavicular adenopathy:  None appreciated Heart:regular rate and rhythm, S1, S2 normal, no murmur, click, rub or gallop Lung:chest clear, no wheezing, rales, normal symmetric air entry, Heart exam - S1, S2 normal, no murmur, no gallop, rate regular Breast: not examined today   Abdomin: soft, non-tender, without masses or organomegaly EXT:No peripheral edema Neuro: No focal deficits  Lab Results:              Radiological Studies: 1. Chest x-ray on 06/27/2010 showed no acute cardiopulmonary process.  2. Digital diagnostic bilateral mammograms on 07/04/2010 showed the masses in both breasts.  3. Digital diagnostic bilateral mammograms carried out on 07/31/2011 showed a dense spiculated mass containing central biopsy clip  artifact in the slightly upper outer left breast with associated nipple and skin retraction without significant change since the most recent prior study of 07/04/2010. On mammogram, this mass measures approximately 2.7 x 2.5 x 2.9 cm. No new mass is identified in the left breast. On the right side, the circumscribed more superiorly and posteriorly positioned mass has decreased in size since 07/04/2010. On the mammogram, it currently measures 3.2 x 3.2 x 2.8 cm, whereas previously it measured 4.0  x 3.8 x 3.4 cm. The more irregular mass in the upper central right breast just anterior to the circumscribed mass is without significant change. It measured approximately 2.9 cm in greatest dimension and is felt to be stable. No new masses identified in the right breast.  4. Chest x-ray, 2 view, from 08/05/2012 showed a large hiatal hernia, but no acute cardiopulmonary disease.  5. Digital diagnostic bilateral mammogram and bilateral breast ultrasound were carried out on 08/12/2012. These studies were compared with the study done from 07/31/2011. There was suggestion of progression. A mass in the left breast that measured 3.3 cm on 11/08 had previously measured 2.6 cm by mammogram. There was another mass  seen in the lower central aspect of the left breast measuring 1.1 cm whereas previously on 07/31/2011 it measured 0.6 cm. Ultrasound was carried out on 08/12/2012. The dominant mass in the left breast measured 2.5 x 2.4 x 3.0 cm by ultrasound. The smaller lesion by ultrasound measured 1.1 x 0.7 x 0.9 cm. I do not believe we had an ultrasound last year for comparison. A lymph node was seen in the left axilla measuring 1.1 x 0.9 x 1.4 cm. There was obliteration of the fatty hilum. There were also by ultrasound masses in the right breast. A mass in the superior aspect of the right breast measured 2.4 x 2.6 x 0.6 cm. This mass was located 3 cm from the nipple. A second mass which was located 1 cm from the nipple measured 1.0 x 0.8 x 1.1 cm. It was felt that the known biopsy proven malignancy in the left breast was progressing. The masses in the right breast were decreasing.  6. Mammogram and ultrasound of the left breast from 08/12/2012 suggested progression compared with the prior mammogram of 07/31/2011. The mass which is seen in about the 3 o'clock position now measures about 3.3 cm as compared with 2.6 cm previously. There was another mass seen at around the 6 o'clock position that measures 1.1 cm, whereas previously it measured 0.6 cm. There was also felt to be a lymph node in the left axilla measuring about 1.4 cm in maximum dimension. There was some obliteration of the fatty hilum suggesting that this may, in fact, be replaced by cancer. Ultrasound exam of the right breast showed a mass in the superior aspect measuring 2.4 x 2.6 x 0.6 cm. The mass is located about 3 cm from the nipple. There was a second mass located about 1 cm from the nipple and this measured about 1.1 cm. The masses  in the right breast have decreased compared with prior studies, whereas the malignancy in the left breast seems to be progressing.  7. PET scan carried out on 09/09/2012 was compared with the prior PET scan from 01/04/2009.  Once again there are 2 lesions seen in the left breast. The larger lesion is located at about the 3 o'clock position. There is a smaller lesion at about 6 o'clock which may be new, as well as a left axillary lymph node. There was previously a dumbbell-shaped lesion in the right breast which was quite large. There is now a small residual focus corresponding to the upper part of the previous dumbbell lesion with some increased activity which raises concern that this residual lesion may be malignant. In reviewing the original PET scan from 01/04/2009, it appears that the patient may have had some small pulmonary metastases which had regressed on treatment with Femara. Some of the lesions are still present on  the most recent PET scan from 09/09/2012, but clearly are smaller.  8. Digital diagnostic mammogram of the left breast on 12/02/2012 shows progression compared with the prior study from 08/12/2012. The larger lesion at about 3 o'clock in the left breast now is felt to measure 3.8 cm in maximum dimension. The second lesion at about 6 o'clock measures 1.1 cm in maximum dimension.  9. Chest x-ray, 2 view, from 12/02/2012 showed some emphysematous changes, but no acute cardiopulmonary findings. There was a large hiatal hernia. 10. CT Chest w contrast on 03/16/2014 (personally reviewed by me).  Progressive enlargement of dominant left breast mass with multiple new breasts masses consistent with metastases.  Deep mass in the right breast has not significantly changed. Multiple new pulmonary nodules consistent with metastatic disease.  New hepatic lesions suspicious for metastatic disease                       Impression and Plan:  1. Left breast cancer, Stage IV.   - Ms. Pebley is doing quite well despite evidence of her progression by CT of chest obtained on 03/16/2014.  Her left breast appears to be smaller with decrease in the size of the mass. She is c/o more bleeding.   Clinically she is  doing well.  --Due to her disease progression, we discontinued faslodex monthly started femara 2.5 mg daily plus palbociclib based on (Finn, 2015). We had an extensive discussion regarding her progression with review of her CT of scan of the chest obtained on 06/12.  She started 125 mg daily for 21 days then 7 days off with her letrozole daily. Due to neutropenia, we decreased her palbociclib to 75 mg daily for 14 days then 7 days off (3 week cycle of palbociclib and femara is continuous). She starts it again on 06/18/2014.   --I am holding Palbociclib now for 3 weeks. I am giving 2 doses of neupogen and rechecking CBC next Friday. She will also get a Feraheme infusion next Friday. Neutropenic precautions discussed.  --Previously, we discussed extensively the indications, benefits and risks including but not limited to abnormal absolute lymphocyte count, neutropenia, leukopenia, thrombocytopenia, weakness, alopecia and fatigue and nausea. She understood these risks and agree to proceed (consent in the chart for palbociclib).  Median progression-free survival was 10.2 months (95% CI 5.7-12.6) for the letrozole group and 20.2 months (13.8-27.5) for the palbociclib plus letrozole group.   --We will follow her labs again on 07/06/2014 and have a MD visit. She does not want to have chemotherapy.  If further further progression we may consider exemestane or tamoxifen.   -- On prior visits, she did see Dr. Eppie Gibson for a radiation oncology consultation on 12/30/2012.  I spoke with her again to give some palliative XRT to left breast for indication of bleeding control.  --RTC in 3 weeks with CBC/CMET and check up.  All questions were answered. The patient knows to call the clinic with any problems, questions or concerns. We can certainly see the patient much sooner if necessary.   I spent 30 minutes counseling the patient face to face. The total time spent in the appointment was 35 minutes.  Bernadene Bell, MD Medical Hematologist/Oncologist [facility] Pager: [phone] Office No: [phone]

## 2014-07-14 ENCOUNTER — Ambulatory Visit (HOSPITAL_BASED_OUTPATIENT_CLINIC_OR_DEPARTMENT_OTHER): Payer: Medicare Other

## 2014-07-14 VITALS — BP 181/73 | HR 94 | Temp 98.0°F | Resp 18

## 2014-07-14 DIAGNOSIS — C773 Secondary and unspecified malignant neoplasm of axilla and upper limb lymph nodes: Secondary | ICD-10-CM

## 2014-07-14 DIAGNOSIS — Z5189 Encounter for other specified aftercare: Secondary | ICD-10-CM

## 2014-07-14 DIAGNOSIS — C50412 Malignant neoplasm of upper-outer quadrant of left female breast: Secondary | ICD-10-CM

## 2014-07-14 DIAGNOSIS — C50912 Malignant neoplasm of unspecified site of left female breast: Secondary | ICD-10-CM

## 2014-07-14 DIAGNOSIS — C50112 Malignant neoplasm of central portion of left female breast: Secondary | ICD-10-CM

## 2014-07-14 MED ORDER — FILGRASTIM 480 MCG/0.8ML IJ SOLN
480.0000 ug | Freq: Once | INTRAMUSCULAR | Status: AC
  Administered 2014-07-14: 480 ug via SUBCUTANEOUS

## 2014-07-14 NOTE — Patient Instructions (Signed)
Filgrastim, G-CSF injection  What is this medicine?  FILGRASTIM, G-CSF (fil GRA stim) is a granulocyte colony-stimulating factor that stimulates the growth of neutrophils, a type of white blood cell (WBC) important in the body's fight against infection. It is used to reduce the incidence of fever and infection in patients with certain types of cancer who are receiving chemotherapy that affects the bone marrow, to stimulate blood cell production for removal of WBCs from the body prior to a bone marrow transplantation, to reduce the incidence of fever and infection in patients who have severe chronic neutropenia, and to improve survival outcomes following high-dose radiation exposure that is toxic to the bone marrow.  This medicine may be used for other purposes; ask your health care provider or pharmacist if you have questions.  COMMON BRAND NAME(S): Neupogen  What should I tell my health care provider before I take this medicine?  They need to know if you have any of these conditions:  -latex allergy  -ongoing radiation therapy  -sickle cell disease  -an unusual or allergic reaction to filgrastim, pegfilgrastim, other medicines, foods, dyes, or preservatives  -pregnant or trying to get pregnant  -breast-feeding  How should I use this medicine?  This medicine is for injection under the skin. If you get this medicine at home, you will be taught how to prepare and give this medicine. Refer to the Instructions for Use that come with your medication packaging. Use exactly as directed. Take your medicine at regular intervals. Do not take your medicine more often than directed.  It is important that you put your used needles and syringes in a special sharps container. Do not put them in a trash can. If you do not have a sharps container, call your pharmacist or healthcare provider to get one.  Talk to your pediatrician regarding the use of this medicine in children. While this drug may be prescribed for children as young  as 7 months for selected conditions, precautions do apply.  Overdosage: If you think you have taken too much of this medicine contact a poison control center or emergency room at once.  NOTE: This medicine is only for you. Do not share this medicine with others.  What if I miss a dose?  It is important not to miss your dose. Call your doctor or health care professional if you miss a dose.  What may interact with this medicine?  This medicine may interact with the following medications:  -medicines that may cause a release of neutrophils, such as lithium  This list may not describe all possible interactions. Give your health care provider a list of all the medicines, herbs, non-prescription drugs, or dietary supplements you use. Also tell them if you smoke, drink alcohol, or use illegal drugs. Some items may interact with your medicine.  What should I watch for while using this medicine?  You may need blood work done while you are taking this medicine.  What side effects may I notice from receiving this medicine?  Side effects that you should report to your doctor or health care professional as soon as possible:  -allergic reactions like skin rash, itching or hives, swelling of the face, lips, or tongue  -dizziness or feeling faint  -fever  -pain, redness, or irritation at site where injected  -pinpoint red spots on the skin  -shortness of breath or breathing problems  -stomach or side pain, or pain at the shoulder  -swelling  -tiredness  -trouble passing urine  -unusual   bleeding or bruising  Side effects that usually do not require medical attention (report to your doctor or health care professional if they continue or are bothersome):  -bone pain  -headache  -muscle pain  This list may not describe all possible side effects. Call your doctor for medical advice about side effects. You may report side effects to FDA at 1-800-FDA-1088.  Where should I keep my medicine?  Keep out of the reach of children.  Store in a  refrigerator between 2 and 8 degrees C (36 and 46 degrees F). Do not freeze. Keep in carton to protect from light. Throw away this medicine if vials or syringes are left out of the refrigerator for more than 24 hours. Throw away any unused medicine after the expiration date.  NOTE: This sheet is a summary. It may not cover all possible information. If you have questions about this medicine, talk to your doctor, pharmacist, or health care provider.   2015, [employer]. (2014-01-05 17:00:01)

## 2014-07-16 ENCOUNTER — Telehealth: Payer: Self-pay | Admitting: *Deleted

## 2014-07-16 NOTE — Telephone Encounter (Signed)
Called to check on patient regarding call-a-nurse c/o back pain on 07/15/14. Patient denies pain at this time, states "I feel much better, no pain at all". Knows to call back with any questions or concerns.

## 2014-07-17 ENCOUNTER — Telehealth: Payer: Self-pay | Admitting: Hematology

## 2014-07-17 NOTE — Telephone Encounter (Signed)
returned pt daughter call...r/s appt per request...lvm with new time

## 2014-07-20 ENCOUNTER — Ambulatory Visit (HOSPITAL_BASED_OUTPATIENT_CLINIC_OR_DEPARTMENT_OTHER): Payer: Medicare Other

## 2014-07-20 ENCOUNTER — Other Ambulatory Visit (HOSPITAL_BASED_OUTPATIENT_CLINIC_OR_DEPARTMENT_OTHER): Payer: Medicare Other

## 2014-07-20 ENCOUNTER — Ambulatory Visit: Payer: Medicare Other

## 2014-07-20 VITALS — BP 175/65 | HR 75 | Temp 98.5°F | Resp 18

## 2014-07-20 DIAGNOSIS — C50112 Malignant neoplasm of central portion of left female breast: Secondary | ICD-10-CM

## 2014-07-20 DIAGNOSIS — C50912 Malignant neoplasm of unspecified site of left female breast: Secondary | ICD-10-CM

## 2014-07-20 DIAGNOSIS — D509 Iron deficiency anemia, unspecified: Secondary | ICD-10-CM | POA: Diagnosis not present

## 2014-07-20 LAB — CBC WITH DIFFERENTIAL/PLATELET
BASO%: 2.3 % — ABNORMAL HIGH (ref 0.0–2.0)
BASOS ABS: 0.1 10*3/uL (ref 0.0–0.1)
EOS ABS: 0.1 10*3/uL (ref 0.0–0.5)
EOS%: 1.5 % (ref 0.0–7.0)
HEMATOCRIT: 32.5 % — AB (ref 34.8–46.6)
HEMOGLOBIN: 10.4 g/dL — AB (ref 11.6–15.9)
LYMPH%: 22.5 % (ref 14.0–49.7)
MCH: 33 pg (ref 25.1–34.0)
MCHC: 32.1 g/dL (ref 31.5–36.0)
MCV: 103 fL — ABNORMAL HIGH (ref 79.5–101.0)
MONO#: 0.9 10*3/uL (ref 0.1–0.9)
MONO%: 19.3 % — ABNORMAL HIGH (ref 0.0–14.0)
NEUT#: 2.6 10*3/uL (ref 1.5–6.5)
NEUT%: 54.4 % (ref 38.4–76.8)
Platelets: 192 10*3/uL (ref 145–400)
RBC: 3.16 10*6/uL — ABNORMAL LOW (ref 3.70–5.45)
RDW: 20 % — ABNORMAL HIGH (ref 11.2–14.5)
WBC: 4.7 10*3/uL (ref 3.9–10.3)
lymph#: 1.1 10*3/uL (ref 0.9–3.3)

## 2014-07-20 MED ORDER — SODIUM CHLORIDE 0.9 % IV SOLN
Freq: Once | INTRAVENOUS | Status: AC
  Administered 2014-07-20: 15:00:00 via INTRAVENOUS

## 2014-07-20 MED ORDER — SODIUM CHLORIDE 0.9 % IV SOLN
1020.0000 mg | Freq: Once | INTRAVENOUS | Status: AC
  Administered 2014-07-20: 1020 mg via INTRAVENOUS
  Filled 2014-07-20: qty 34

## 2014-07-20 NOTE — Patient Instructions (Signed)

## 2014-07-25 ENCOUNTER — Ambulatory Visit: Payer: Medicare Other

## 2014-07-25 ENCOUNTER — Ambulatory Visit: Payer: Medicare Other | Admitting: Radiation Oncology

## 2014-07-26 NOTE — Progress Notes (Signed)
  Location of Breast Cancer: Left breast diagnosed Nov 2009, 2'O'clock  Histology per Pathology Report:   01/02/2009 1. BREAST, LEFT, NEEDLE BIOPSY: INVASIVE DUCTAL CARCINOMA.  2. LYMPH NODE, LEFT, NEEDLE CORE BIOPSY: METASTATIC DUCTAL CARCINOMA.  1. Spiculated mass 2 o' clock left breast.  2. Abnormal left axillary lymph node.   Receptor Status: ER(100%), PR (99%), Her2-neu (-)   Patient presented with locally advanced cancer of the left breast diagnosed with biopsy on 01/02/2009 although changes of breast cancer were apparently evident in November 2009. There was clinical lymph node involvement. Tumor stage was T4b N1 M0, stage IIIB. Estrogen receptor was 100%, progesterone receptor 99%. Proliferative index was 45%. HER-2/neu was negative. The patient declined surgery, radiation and chemotherapy.   Past/Anticipated interventions by surgeon, if any: Biopsy of Left Breast on 01/02/09  Past/Anticipated interventions by medical oncology, if any: Chemotherapy Dr Ralene Ok - Femara was started on Feb 02, 2009, and continued through 12/14/2012. The patient had an excellent response to Femara; however, in November 2013 there was suggestion of slow progression involving the left breast. Based on progression, Faslodex 500 mg IM started on 12/14/2012 with initial loading dose and then monthly IM injections. It was discontinued on 02/16/2014 due to progression.   Femara 2.5 mg daily plus palbociclib 75 mg daily for 2 weeks on and one week off (dose modified because of neutropenia). . She had neutropenia and was given granix x 2 on 07/24/ and 07/25 respectively. Her dose was of palbociclib was decreased to 75 mg daily on 2 weeks on; one week off. Patient has been on Palbociclib since June 2015  Lymphedema issues, if any:    no  Pain issues, if any: no  On 07/13/14 Patient  reported decreased intermittent bleeding on her left breast only when she is on Ibrance. She is saying that since she started the  Ibrance (palbociclib), she has noticed that the breast mass has shrunk, does not bleed that much and is softer. 07/27/14  Patient has been OFF Ibrance x 4 weeks and has noticed increased drainage, bleeding from left breast biopsy site. She cleanses area with either alcohol or soap and water, does not apply any ointments, lotions, creams, medications.      SAFETY ISSUES:  Prior radiation? No  Pacemaker/ICD? No  Possible current pregnancy? No  Is the patient on methotrexate? No  Current Complaints / other details: lives with daughter, cleans, mows yard  Never Smoker, No Alcohol, No Illicit Drug Use     Barbara Rhein, RN 07/26/2014,2:44 PM

## 2014-07-27 ENCOUNTER — Ambulatory Visit
Admission: RE | Admit: 2014-07-27 | Discharge: 2014-07-27 | Disposition: A | Discharge status: Home / Self Care | Payer: Medicare Other | Source: Ambulatory Visit | Attending: Radiation Oncology | Admitting: Radiation Oncology

## 2014-07-27 ENCOUNTER — Encounter: Payer: Self-pay | Admitting: Radiation Oncology

## 2014-07-27 VITALS — BP 148/82 | HR 84 | Temp 98.1°F | Resp 20 | Ht 62.0 in | Wt 113.7 lb

## 2014-07-27 DIAGNOSIS — C50112 Malignant neoplasm of central portion of left female breast: Secondary | ICD-10-CM

## 2014-07-27 DIAGNOSIS — Z510 Encounter for antineoplastic radiation therapy: Secondary | ICD-10-CM | POA: Diagnosis not present

## 2014-07-27 NOTE — Progress Notes (Signed)
Radiation Oncology         [phone]) [phone] ________________________________ Outpatient followup  Name: [name] MRN: [id number]  Date: 07/27/2014  DOB: [birth year]  [id number] [name], MD  Bernad[facility] [place], *   REFERRING PHYSICIAN: Glean Salvo, *  DIAGNOSIS: [id number] breast invasive ductal carcinoma, ER/PR positive HER-2/neu negative with pulmonary nodules and hepatic lesions    ICD-9-CM ICD-10-CM  1. Malignant neoplasm of central portion of female breast, left 174.1 C50.112     HISTORY OF PRESENT ILLNESS::[name] is a 80 y.o. female who returns for followup due to poor control of an ulcerating breast mass while on systemic therapy.    Since she saw me for consult in March 2014, Femara was switched to Faslodex 500 mg IM to address her progressive disease. But, it was discontinued on 02/16/2014 due to progression. She was then managed on Femara and palbociclib. This was complicated by neutropenia.   Recently, when she saw Dr. Lona Kettle, she c/o left breast having bloody discharge and more so in the morning. She dresses that twice a day and wash with alcohol Since she started the Ibrance (palbociclib), she has noticed that the breast mass has shrunk, does not bleed that much and is softer. Dr Arlys [name] spoke with me and we discussed a potential short course of palliative XRT to breast to reduce the bleeding.    She has stopped the Ibrance (palbociclib) and since noted more redness and drainage and soreness from her left breast which bothers her. No fevers or chills.  She is very active - mows the lawn, cleans the house.  PREVIOUS RADIATION THERAPY: No  PAST MEDICAL HISTORY:  has a past medical history of Anemia, unspecified; Malignant neoplasm of breast (female), unspecified site; Anemia, iron deficiency; Hiatal hernia; HTN (hypertension); Non-sustained ventricular tachycardia; Cholelithiasis; Breast cancer, left breast (10/28/2011); GERD (gastroesophageal reflux disease);  Pulmonary nodules/lesions, multiple (09/09/12); Use of fulvestrant (Faslodex) (12/14/12); Erythema (12/14/12); and Bleeding (12/14/12).    PAST SURGICAL HISTORY:No past surgical history on file.  FAMILY HISTORY: family history includes Blindness in her maternal grandmother. There is no history of Cancer.  SOCIAL HISTORY:  reports that she has never smoked. She has never used smokeless tobacco. She reports that she does not drink alcohol or use illicit drugs.  ALLERGIES: Ace inhibitors; Amlodipine; and Lisinopril  MEDICATIONS:  Current Outpatient Prescriptions  Medication Sig Dispense Refill  . aspirin 81 MG tablet Take 81 mg by mouth daily.       . Cholecalciferol 5000 UNITS TABS Take 5,000 Units by mouth daily.      . Ferrous Sulfate (IRON) 325 (65 FE) MG TABS Take 325 mg by mouth daily.      . irbesartan (AVAPRO) 150 MG tablet Take 1 tablet by mouth 2 (two) times daily.      Marland Kitchen letrozole (FEMARA) 2.5 MG tablet Take 1 tablet (2.5 mg total) by mouth daily.  90 tablet  2  . Multiple Vitamin (MULTIVITAMIN) tablet Take 1 tablet by mouth daily.      . palbociclib (IBRANCE) 75 MG capsule Take whole with food.  Take one tablet by mouth for 2 weeks and off for one week every 21 days.  14 capsule  5  . senna (SENOKOT) 8.6 MG tablet Take 1 tablet by mouth as needed.      . vitamin B-12 (CYANOCOBALAMIN) 100 MCG tablet Take 50 mcg by mouth daily.       No current facility-administered medications for this encounter.    REVIEW OF  SYSTEMS:  Notable for that above.   PHYSICAL EXAM:  height is 5' 2"  (1.575 m) and weight is 113 lb 11.2 oz (51.574 kg). Her oral temperature is 98.1 F (36.7 C). Her blood pressure is 148/82 and her pulse is 84. Her respiration is 20.   General: Alert and oriented, in no acute distress HEENT: Head is normocephalic. Pupils are equally round and reactive to light. Extraocular movements are intact. Oropharynx is clear. Neck: Neck is supple, no palpable cervical or  supraclavicular lymphadenopathy. Heart: Regular in rate and rhythm with no murmurs, rubs, or gallops. Chest: Clear to auscultation bilaterally, with no rhonchi, wheezes, or rales. Abdomen: Soft, nontender, nondistended, with no rigidity or guarding. Extremities: No cyanosis or edema. Lymphatics: see HEENT Skin: No concerning lesions. Musculoskeletal: symmetric strength and muscle tone throughout. Neurologic: Cranial nerves II through XII are grossly intact. No obvious focalities. Speech is fluent. Coordination is intact. Psychiatric: Judgment and insight are intact. Affect is appropriate. Breast Exam - Left breast notable for central ulcerative mass and multiple satellite lesions. No signs of mastitis or infection. Hard to assess axilla (ticklish)  ECOG = 1  0 - Asymptomatic (Fully active, able to carry on all predisease activities without restriction)  1 - Symptomatic but completely ambulatory (Restricted in physically strenuous activity but ambulatory and able to carry out work of a light or sedentary nature. For example, light housework, office work)  2 - Symptomatic, <50% in bed during the day (Ambulatory and capable of all self care but unable to carry out any work activities. Up and about more than 50% of waking hours)  3 - Symptomatic, >50% in bed, but not bedbound (Capable of only limited self-care, confined to bed or chair 50% or more of waking hours)  4 - Bedbound (Completely disabled. Cannot carry on any self-care. Totally confined to bed or chair)  5 - Death   Eustace Pen MM, Creech RH, Tormey DC, et al. [phone]). "Toxicity and response criteria of the [facility]". [place] Oncol. 5 (6): 649-55   LABORATORY DATA:  Lab Results  Component Value Date   WBC 4.7 07/20/2014   HGB 10.4* 07/20/2014   HCT 32.5* 07/20/2014   MCV 103.0* 07/20/2014   PLT 192 07/20/2014   CMP     Component Value Date/Time   NA 138 07/13/2014 1018   NA 139 03/18/2012 1339   K  4.3 07/13/2014 1018   K 4.3 03/18/2012 1339   CL 105 03/10/2013 1536   CL 105 03/18/2012 1339   CO2 28 07/13/2014 1018   CO2 27 03/18/2012 1339   GLUCOSE 101 07/13/2014 1018   GLUCOSE 102* 03/10/2013 1536   GLUCOSE 98 03/18/2012 1339   BUN 11.1 07/13/2014 1018   BUN 13 03/18/2012 1339   CREATININE 0.7 07/13/2014 1018   CREATININE 0.78 03/18/2012 1339   CALCIUM 8.9 07/13/2014 1018   CALCIUM 9.1 03/18/2012 1339   PROT 6.3* 07/13/2014 1018   PROT 6.5 03/18/2012 1339   ALBUMIN 3.4* 07/13/2014 1018   ALBUMIN 4.2 03/18/2012 1339   AST 20 07/13/2014 1018   AST 18 03/18/2012 1339   ALT 13 07/13/2014 1018   ALT 16 03/18/2012 1339   ALKPHOS 69 07/13/2014 1018   ALKPHOS 96 03/18/2012 1339   BILITOT 0.26 07/13/2014 1018   BILITOT 0.3 03/18/2012 1339   GFRNONAA >60 08/24/2008 0850   GFRAA  Value: >60        The eGFR has been calculated using the MDRD equation.  This calculation has not been validated in all clinical 08/24/2008 0850         RADIOGRAPHY: No results found.    IMPRESSION/PLAN: Stage IV Left breast cancer, ulcerative primary disease refractory to systemic therapy.  She understands her best chance of palliative/ local control would be mastectomy, but she is not interested in that. As an alternative, she is a good candidate for palliative RT.  We discussed the risks, benefits, and side effects of radiotherapy. Radiotherapy is unlikely to make the tumors in her breast go away, but will hopefully stunt their growth and ultimately decrease drainage. We discussed that radiation would take approximately 4 weeks to complete and that I would give the patient a few weeks to heal following surgery before starting treatment planning. We spoke about acute effects including skin irritation and fatigue as well as much less common late effects including lung and heart irritation. We spoke about the latest technology that is used to minimize the risk of late effects for breast cancer patients undergoing radiotherapy. No  guarantees of treatment were given. The patient is enthusiastic about proceeding with treatment. I look forward to participating in the patient's care.   Simulation to occur this AM. __________________________________________   Eppie Gibson, MD

## 2014-07-27 NOTE — Progress Notes (Signed)
  Radiation Oncology         [phone] ________________________________  Name: [name] MRN: [id number]  Date: 07/27/2014  DOB: 1922-04-07  SIMULATION AND TREATMENT PLANNING NOTE  Outpatient  DIAGNOSIS:     ICD-9-CM ICD-10-CM  1. Malignant neoplasm of central portion of female breast, left 174.1 C50.112    NARRATIVE:  The patient was brought to the [facility].  Identity was confirmed.  All relevant records and images related to the planned course of therapy were reviewed.  The patient freely provided informed written consent to proceed with treatment after reviewing the details related to the planned course of therapy. The consent form was witnessed and verified by the simulation staff.    Then, the patient was set-up in a stable reproducible  supine position for radiation therapy.  CT images were obtained.  Surface markings were placed.  The CT images were loaded into the planning software.    TREATMENT PLANNING NOTE: Treatment planning then occurred.  The radiation prescription was entered and confirmed.    A total of 3 medically necessary complex treatment devices were fabricated and supervised by me - vaclock for arm/head and opposed tangents with MLCs to block lungs and heart. I have requested : 3D Simulation  I have requested a DVH of the following structures: heart, lungs, lump cavity.    The patient will receive 40.05 Gy in 15 fraction to the left breast followed by a boost of 10 Gy in 5 fractions, likely with electrons.  Optical Surface Tracking Plan:  Since intensity modulated radiotherapy (IMRT) and 3D conformal radiation treatment methods are predicated on accurate and precise positioning for treatment, intrafraction motion monitoring is medically necessary to ensure accurate and safe treatment delivery. The ability to quantify intrafraction motion without excessive ionizing radiation dose can only be performed with optical surface tracking. Accordingly,  surface imaging offers the opportunity to obtain 3D measurements of patient position throughout IMRT and 3D treatments without excessive radiation exposure. I am ordering optical surface tracking for this patient's upcoming course of radiotherapy.  ________________________________   Reference:  Ursula Alert, J, et al. Surface imaging-based analysis of intrafraction motion for breast radiotherapy patients.Journal of [employer], n. 6, nov. 2014. ISSN [id number].  Available at: <http://www.jacmp.org/index.php/jacmp/article/view/4957>.   -----------------------------------  Eppie Gibson, MD

## 2014-07-27 NOTE — Addendum Note (Signed)
Encounter addended by: Arlyss Repress, RN on: 07/27/2014  9:26 AM<BR>     Documentation filed: Arn Medal VN

## 2014-07-27 NOTE — Progress Notes (Signed)
Please see the Nurse Progress Note in the MD Initial Consult Encounter for this patient. 

## 2014-08-03 ENCOUNTER — Telehealth: Payer: Self-pay | Admitting: Hematology

## 2014-08-03 ENCOUNTER — Encounter: Payer: Self-pay | Admitting: Hematology

## 2014-08-03 ENCOUNTER — Other Ambulatory Visit (HOSPITAL_BASED_OUTPATIENT_CLINIC_OR_DEPARTMENT_OTHER): Payer: Medicare Other

## 2014-08-03 ENCOUNTER — Ambulatory Visit (HOSPITAL_BASED_OUTPATIENT_CLINIC_OR_DEPARTMENT_OTHER): Payer: Medicare Other | Admitting: Hematology

## 2014-08-03 ENCOUNTER — Ambulatory Visit: Payer: Medicare Other | Admitting: Radiation Oncology

## 2014-08-03 VITALS — BP 130/84 | HR 84 | Temp 98.1°F | Resp 18 | Ht 62.0 in | Wt 114.0 lb

## 2014-08-03 DIAGNOSIS — C50912 Malignant neoplasm of unspecified site of left female breast: Secondary | ICD-10-CM

## 2014-08-03 DIAGNOSIS — Z510 Encounter for antineoplastic radiation therapy: Secondary | ICD-10-CM | POA: Diagnosis not present

## 2014-08-03 DIAGNOSIS — C7800 Secondary malignant neoplasm of unspecified lung: Secondary | ICD-10-CM | POA: Diagnosis not present

## 2014-08-03 DIAGNOSIS — C50412 Malignant neoplasm of upper-outer quadrant of left female breast: Secondary | ICD-10-CM

## 2014-08-03 DIAGNOSIS — C773 Secondary and unspecified malignant neoplasm of axilla and upper limb lymph nodes: Secondary | ICD-10-CM | POA: Diagnosis not present

## 2014-08-03 LAB — CBC WITH DIFFERENTIAL/PLATELET
BASO%: 0.4 % (ref 0.0–2.0)
BASOS ABS: 0 10*3/uL (ref 0.0–0.1)
EOS%: 1.1 % (ref 0.0–7.0)
Eosinophils Absolute: 0.1 10*3/uL (ref 0.0–0.5)
HEMATOCRIT: 32.2 % — AB (ref 34.8–46.6)
HGB: 10.5 g/dL — ABNORMAL LOW (ref 11.6–15.9)
LYMPH%: 23.7 % (ref 14.0–49.7)
MCH: 34.3 pg — AB (ref 25.1–34.0)
MCHC: 32.6 g/dL (ref 31.5–36.0)
MCV: 105.1 fL — ABNORMAL HIGH (ref 79.5–101.0)
MONO#: 0.5 10*3/uL (ref 0.1–0.9)
MONO%: 12 % (ref 0.0–14.0)
NEUT#: 2.8 10*3/uL (ref 1.5–6.5)
NEUT%: 62.8 % (ref 38.4–76.8)
PLATELETS: 229 10*3/uL (ref 145–400)
RBC: 3.06 10*6/uL — ABNORMAL LOW (ref 3.70–5.45)
RDW: 18.4 % — ABNORMAL HIGH (ref 11.2–14.5)
WBC: 4.5 10*3/uL (ref 3.9–10.3)
lymph#: 1.1 10*3/uL (ref 0.9–3.3)

## 2014-08-03 LAB — COMPREHENSIVE METABOLIC PANEL (CC13)
ALK PHOS: 70 U/L (ref 40–150)
ALT: 25 U/L (ref 0–55)
AST: 23 U/L (ref 5–34)
Albumin: 3.4 g/dL — ABNORMAL LOW (ref 3.5–5.0)
Anion Gap: 7 mEq/L (ref 3–11)
BUN: 10 mg/dL (ref 7.0–26.0)
CALCIUM: 8.9 mg/dL (ref 8.4–10.4)
CO2: 26 mEq/L (ref 22–29)
CREATININE: 0.7 mg/dL (ref 0.6–1.1)
Chloride: 106 mEq/L (ref 98–109)
Glucose: 116 mg/dl (ref 70–140)
Potassium: 4.1 mEq/L (ref 3.5–5.1)
Sodium: 139 mEq/L (ref 136–145)
Total Bilirubin: 0.3 mg/dL (ref 0.20–1.20)
Total Protein: 6.2 g/dL — ABNORMAL LOW (ref 6.4–8.3)

## 2014-08-03 NOTE — Telephone Encounter (Signed)
Pt confirmed labs/ov per 10/30 POF, gave pt AVS.... KJ °

## 2014-08-03 NOTE — Progress Notes (Signed)
Hematology and Oncology Follow Up Visit Date of Visit: 08/03/2014  [name] [id number] 07/07/1922 80 y.o.  CC: Velna Hatchet, MDJohn, [name], MD   Chief Complaint:   1. Left breast cancer causing bleeding. 2. Neutropenia Resolved (Ibrance on hold)   Principal Diagnosis: Locally advanced cancer of the left breast diagnosed with biopsy on 01/02/2009 although changes of breast cancer were apparently evident in November 2009. There was clinical lymph node involvement. Tumor stage was T4b N1 M0, stage IIIB.  Estrogen receptor was 100%, progesterone receptor 99%. Proliferative index was 45%. HER-2/neu was negative. The patient declined surgery, radiation and chemotherapy.   Prior Therapy: Femara was started on Feb 02, 2009, and continued through 12/14/2012. The patient had an excellent response to Femara; however, in November 2013 there was suggestion of slow progression involving the left breast. Based on progression, Faslodex 500 mg IM started on 12/14/2012 with initial loading dose and then  monthly IM injections. It was discontinued on 02/16/2014 due to progression.   Current therapy:  Femara 2.5 mg daily plus palbociclib 75 mg daily for 2 weeks on and one week off (dose modified because of neutropenia). .  She had neutropenia and was given granix x 2 on 07/24/ and 07/25 respectively.   Her dose was of palbociclib was decreased to 75 mg daily on 2 weeks on; one week off. Patient has been on Palbociclib since June 2015. Currently held again as her [place] 900 today and she will get two doses of G-CSF today and tomorrow. I will start Ibrance again post-completion of Radiation. She is on Femara now.  Interim History: [name] was seen today for followup of her locally advanced cancer involving the left breast with diagnosis going back to March 2010.  The patient may have had small pulmonary metastases at the time of diagnosis.  She may also have a small focus of cancer in the right breast as per  recent imaging studies.  She was last seen by Dr Juliann Mule on 05/18/2014. 04/20/2014.    Today, she is accompanied today by her daughter, [name].  She continues to feel entirely well, is asymptomatic, lives alone and is totally independent and active around her home.    She is c/o left breast having bloody discharge and more so in the morning. She dresses that twice a day and wash with alcohol. She does report decreased intermittent bleeding on her left breast only when she is on Ibrance. She is saying that since she started the Ibrance (palbociclib), she has noticed that the breast mass has shrunk, does not bleed that much and is softer. I spoke with Dr Isidore Moos last visit on 07/13/14 and patient was seen. They're planning a four-week course of radiation which will start on 08/06/2014. Patient was elevated reluctant about the duration of radiation but I explained to her that this is important and will hopefully control the bleeding problem from the left breast. I also told her not to take aspirin for pain but uses Tylenol instead. Her left breast is tender and it has a stinging sensation present.  Patient states that she has felt a whole lot better after Feraheme infusion which she received on 07/20/2014 and I'm planning to give it to her every 3-4 months based on her iron saturation.  SMOKING HISTORY: The patient has never smoked cigarettes.  Medications: I have reviewed the patient's current medications.  Current Outpatient Prescriptions  Medication Sig Dispense Refill  . aspirin 81 MG tablet Take 81 mg by mouth  daily.       . Cholecalciferol 5000 UNITS TABS Take 5,000 Units by mouth daily.      . Ferrous Sulfate (IRON) 325 (65 FE) MG TABS Take 325 mg by mouth daily.      . irbesartan (AVAPRO) 150 MG tablet Take 1 tablet by mouth 2 (two) times daily.      Marland Kitchen letrozole (FEMARA) 2.5 MG tablet Take 1 tablet (2.5 mg total) by mouth daily.  90 tablet  2  . Multiple Vitamin (MULTIVITAMIN) tablet Take 1 tablet by  mouth daily.      Marland Kitchen senna (SENOKOT) 8.6 MG tablet Take 1 tablet by mouth as needed.      . vitamin B-12 (CYANOCOBALAMIN) 100 MCG tablet Take 50 mcg by mouth daily.      . palbociclib (IBRANCE) 75 MG capsule Take whole with food.  Take one tablet by mouth for 2 weeks and off for one week every 21 days.  14 capsule  5   No current facility-administered medications for this visit.   Allergies:  Allergies  Allergen Reactions  . Ace Inhibitors Itching  . Amlodipine Nausea Only and Rash    Leg cramps  . Lisinopril Rash   Hematology/Oncology Problem List: 1. Cystic masses involving the right breast with negative biopsy on 01/02/2009.  2. Iron-deficiency anemia requiring hospitalization, 4 units of blood and intravenous iron. The patient had positive stools but a negative colonoscopy on 02/28/2009. Feraheme 510 mg IV was given on 11/06/2011, 11/13/2011, 08/12/2012 and 08/19/2012.   Past Medical History, Surgical history, Social history, and Family History were reviewed and updated.  Review of Systems: Constitutional:  Negative for fever, chills, night sweats, anorexia, weight loss, pain. Cardiovascular: no chest pain or dyspnea on exertion Respiratory: no cough, shortness of breath, or wheezing Neurological: no TIA or stroke symptoms Dermatological: negative for lumps and skin lesion changes ENT: negative for - epistaxis Skin: Negative. Gastrointestinal: no abdominal pain, change in bowel habits, or black or bloody stools Genito-Urinary: no dysuria, trouble voiding, or hematuria Hematological and Lymphatic: negative for - bleeding problems or bruising Breast: negative for breast lumps Musculoskeletal: negative for - gait disturbance Remaining ROS negative.  Physical Exam: Blood pressure 130/84, pulse 84, temperature 98.1 F (36.7 C), resp. rate 18, height 5' 2"  (1.575 m), weight 114 lb (51.71 kg), SpO2 100.00%. ECOG: 1 General appearance: alert, cooperative, appears stated age and no  distress Head: Normocephalic, without obvious abnormality, atraumatic Neck: no adenopathy, supple, symmetrical, trachea midline and thyroid not enlarged, symmetric, no tenderness/mass/nodules Lymph nodes: Cervical adenopathy: None appreciated and Supraclavicular adenopathy: None appreciated Heart:regular rate and rhythm, S1, S2 normal, no murmur, click, rub or gallop Lung:chest clear, no wheezing, rales, normal symmetric air entry, Heart exam - S1, S2 normal, no murmur, no gallop, rate regular Breast: left breast mass about 4-5 cm fungating and vascular   Abdomin: soft, non-tender, without masses or organomegaly EXT:No peripheral edema Neuro: No focal deficits  Lab Results:       Radiological Studies: 1. Chest x-ray on 06/27/2010 showed no acute cardiopulmonary process.  2. Digital diagnostic bilateral mammograms on 07/04/2010 showed the masses in both breasts.  3. Digital diagnostic bilateral mammograms carried out on 07/31/2011 showed a dense spiculated mass containing central biopsy clip  artifact in the slightly upper outer left breast with associated nipple and skin retraction without significant change since the most recent prior study of 07/04/2010. On mammogram, this mass measures approximately 2.7 x 2.5 x 2.9 cm. No new mass  is identified in the left breast. On the right side, the circumscribed more superiorly and posteriorly positioned mass has decreased in size since 07/04/2010. On the mammogram, it currently measures 3.2 x 3.2 x 2.8 cm, whereas previously it measured 4.0 x 3.8 x 3.4 cm. The more irregular mass in the upper central right breast just anterior to the circumscribed mass is without significant change. It measured approximately 2.9 cm in greatest dimension and is felt to be stable. No new masses identified in the right breast.  4. Chest x-ray, 2 view, from 08/05/2012 showed a large hiatal hernia, but no acute cardiopulmonary disease.  5. Digital diagnostic bilateral  mammogram and bilateral breast ultrasound were carried out on 08/12/2012. These studies were compared with the study done from 07/31/2011. There was suggestion of progression. A mass in the left breast that measured 3.3 cm on 11/08 had previously measured 2.6 cm by mammogram. There was another mass seen in the lower central aspect of the left breast measuring 1.1 cm whereas previously on 07/31/2011 it measured 0.6 cm. Ultrasound was carried out on 08/12/2012. The dominant mass in the left breast measured 2.5 x 2.4 x 3.0 cm by ultrasound. The smaller lesion by ultrasound measured 1.1 x 0.7 x 0.9 cm. I do not believe we had an ultrasound last year for comparison. A lymph node was seen in the left axilla measuring 1.1 x 0.9 x 1.4 cm. There was obliteration of the fatty hilum. There were also by ultrasound masses in the right breast. A mass in the superior aspect of the right breast measured 2.4 x 2.6 x 0.6 cm. This mass was located 3 cm from the nipple. A second mass which was located 1 cm from the nipple measured 1.0 x 0.8 x 1.1 cm. It was felt that the known biopsy proven malignancy in the left breast was progressing. The masses in the right breast were decreasing.  6. Mammogram and ultrasound of the left breast from 08/12/2012 suggested progression compared with the prior mammogram of 07/31/2011. The mass which is seen in about the 3 o'clock position now measures about 3.3 cm as compared with 2.6 cm previously. There was another mass seen at around the 6 o'clock position that measures 1.1 cm, whereas previously it measured 0.6 cm. There was also felt to be a lymph node in the left axilla measuring about 1.4 cm in maximum dimension. There was some obliteration of the fatty hilum suggesting that this may, in fact, be replaced by cancer. Ultrasound exam of the right breast showed a mass in the superior aspect measuring 2.4 x 2.6 x 0.6 cm. The mass is located about 3 cm from the nipple. There was a second mass located  about 1 cm from the nipple and this measured about 1.1 cm. The masses  in the right breast have decreased compared with prior studies, whereas the malignancy in the left breast seems to be progressing.  7. PET scan carried out on 09/09/2012 was compared with the prior PET scan from 01/04/2009. Once again there are 2 lesions seen in the left breast. The larger lesion is located at about the 3 o'clock position. There is a smaller lesion at about 6 o'clock which may be new, as well as a left axillary lymph node. There was previously a dumbbell-shaped lesion in the right breast which was quite large. There is now a small residual focus corresponding to the upper part of the previous dumbbell lesion with some increased activity which raises concern that  this residual lesion may be malignant. In reviewing the original PET scan from 01/04/2009, it appears that the patient may have had some small pulmonary metastases which had regressed on treatment with Femara. Some of the lesions are still present on the most recent PET scan from 09/09/2012, but clearly are smaller.  8. Digital diagnostic mammogram of the left breast on 12/02/2012 shows progression compared with the prior study from 08/12/2012. The larger lesion at about 3 o'clock in the left breast now is felt to measure 3.8 cm in maximum dimension. The second lesion at about 6 o'clock measures 1.1 cm in maximum dimension.  9. Chest x-ray, 2 view, from 12/02/2012 showed some emphysematous changes, but no acute cardiopulmonary findings. There was a large hiatal hernia. 10. CT Chest w contrast on 03/16/2014 (personally reviewed by me).  Progressive enlargement of dominant left breast mass with multiple new breasts masses consistent with metastases.  Deep mass in the right breast has not significantly changed. Multiple new pulmonary nodules consistent with metastatic disease.  New hepatic lesions suspicious for metastatic  disease                       Impression and Plan:  1. Left breast cancer, Stage IV.   - Ms. Kasparek is doing quite well despite evidence of her progression by CT of chest obtained on 03/16/2014.  Her left breast appears to be smaller with decrease in the size of the mass. She is c/o more bleeding.   Clinically she is doing well.  --Due to her disease progression, we discontinued faslodex monthly started femara 2.5 mg daily plus palbociclib based on (Finn, 2015). We had an extensive discussion regarding her progression with review of her CT of scan of the chest obtained on 06/12.  She started 125 mg daily for 21 days then 7 days off with her letrozole daily. Due to neutropenia, we decreased her palbociclib to 75 mg daily for 14 days then 7 days off (3 week cycle of palbociclib and femara is continuous). She starts it again on 06/18/2014.   --I am holding Palbociclib now for 3 weeks. She got a Feraheme infusion.    --Previously, we discussed extensively the indications, benefits and risks including but not limited to abnormal absolute lymphocyte count, neutropenia, leukopenia, thrombocytopenia, weakness, alopecia and fatigue and nausea. She understood these risks and agree to proceed (consent in the chart for palbociclib).  Median progression-free survival was 10.2 months (95% CI 5.7-12.6) for the letrozole group and 20.2 months (13.8-27.5) for the palbociclib plus letrozole group.   --We will follow her labs again on 07/06/2014 and have a MD visit. She does not want to have chemotherapy.  If further further progression we may consider exemestane or tamoxifen.   -- On prior visits, she did see Dr. Eppie Gibson for a radiation oncology consultation on 12/30/2012.  I spoke with her again to give some palliative XRT to left breast for indication of bleeding control. That will start on 08/06/2014.  --RTC in 4 weeks with CBC/CMET and check up.  All questions were answered. The patient  knows to call the clinic with any problems, questions or concerns. We can certainly see the patient much sooner if necessary.   I spent 25 minutes counseling the patient face to face. The total time spent in the appointment was 35 minutes.  Bernadene Bell, MD Medical Hematologist/Oncologist [place] Pager: [phone] Office No: [phone]

## 2014-08-06 ENCOUNTER — Inpatient Hospital Stay
Admission: RE | Admit: 2014-08-06 | Discharge: 2014-08-06 | Disposition: A | Discharge status: Home / Self Care | Payer: Medicare Other | Source: Ambulatory Visit | Attending: Radiation Oncology | Admitting: Radiation Oncology

## 2014-08-06 ENCOUNTER — Ambulatory Visit
Admission: RE | Admit: 2014-08-06 | Discharge: 2014-08-06 | Disposition: A | Discharge status: Home / Self Care | Payer: Medicare Other | Source: Ambulatory Visit | Attending: Radiation Oncology | Admitting: Radiation Oncology

## 2014-08-06 ENCOUNTER — Encounter: Payer: Self-pay | Admitting: Radiation Oncology

## 2014-08-06 VITALS — BP 136/88 | HR 80 | Temp 97.6°F | Resp 12 | Wt 116.5 lb

## 2014-08-06 DIAGNOSIS — C50112 Malignant neoplasm of central portion of left female breast: Secondary | ICD-10-CM

## 2014-08-06 DIAGNOSIS — Z510 Encounter for antineoplastic radiation therapy: Secondary | ICD-10-CM | POA: Diagnosis not present

## 2014-08-06 NOTE — Progress Notes (Signed)
   Weekly Management Note:  Outpatient    ICD-9-CM ICD-10-CM   1. Malignant neoplasm of central portion of female breast, left 174.1 C50.112     Current Dose:  2.67 Gy  Projected Dose: 50.05 Gy   Narrative:  The patient presents for routine under treatment assessment.  CBCT/MVCT images/Port film x-rays were reviewed.  The chart was checked. No new complaints  Physical Findings:  weight is 116 lb 8 oz (52.844 kg). Her oral temperature is 97.6 F (36.4 C). Her blood pressure is 136/88 and her pulse is 80. Her respiration is 12 and oxygen saturation is 100%.  NAD  Impression:  The patient is tolerating radiotherapy.  Plan:  Continue radiotherapy as planned.   ________________________________   Eppie Gibson, M.D.

## 2014-08-06 NOTE — Progress Notes (Signed)
Simulation Verification Note  The patient was brought to the treatment unit and placed in the planned treatment position. The clinical setup was verified. Then port films were obtained and uploaded to the radiation oncology medical record software.  The treatment beams were carefully compared against the planned radiation fields. The position location and shape of the radiation fields was reviewed. They targeted volume of tissue appears to be appropriately covered by the radiation beams. Organs at risk appear to be excluded as planned.  Based on my personal review, I approved the simulation verification. The patient's treatment will proceed as planned.  -----------------------------------  Aldina Porta, MD  

## 2014-08-06 NOTE — Progress Notes (Addendum)
She is currently in no pain.  Pt left breast positive for dryness and pruritus, multiple open wounds noted on breast.  Pt reports periods of bleeding for the large wound which is approx 2 inches in size.  The patient eats a regular, healthy diet.. Pt reports using the automatic BP machine registers a false reading.  BP taken manually.

## 2014-08-07 ENCOUNTER — Ambulatory Visit: Admission: RE | Admit: 2014-08-07 | Payer: Medicare Other | Source: Ambulatory Visit

## 2014-08-07 ENCOUNTER — Ambulatory Visit: Payer: Medicare Other | Admitting: Radiation Oncology

## 2014-08-08 ENCOUNTER — Ambulatory Visit
Admission: RE | Admit: 2014-08-08 | Discharge: 2014-08-08 | Disposition: A | Discharge status: Home / Self Care | Payer: Medicare Other | Source: Ambulatory Visit | Attending: Radiation Oncology | Admitting: Radiation Oncology

## 2014-08-08 DIAGNOSIS — Z510 Encounter for antineoplastic radiation therapy: Secondary | ICD-10-CM | POA: Diagnosis not present

## 2014-08-09 ENCOUNTER — Ambulatory Visit
Admission: RE | Admit: 2014-08-09 | Discharge: 2014-08-09 | Disposition: A | Discharge status: Home / Self Care | Payer: Medicare Other | Source: Ambulatory Visit | Attending: Radiation Oncology | Admitting: Radiation Oncology

## 2014-08-09 DIAGNOSIS — Z510 Encounter for antineoplastic radiation therapy: Secondary | ICD-10-CM | POA: Diagnosis not present

## 2014-08-10 ENCOUNTER — Ambulatory Visit
Admission: RE | Admit: 2014-08-10 | Discharge: 2014-08-10 | Disposition: A | Discharge status: Home / Self Care | Payer: Medicare Other | Source: Ambulatory Visit | Attending: Radiation Oncology | Admitting: Radiation Oncology

## 2014-08-10 DIAGNOSIS — Z510 Encounter for antineoplastic radiation therapy: Secondary | ICD-10-CM | POA: Diagnosis not present

## 2014-08-10 NOTE — Addendum Note (Signed)
Encounter addended by: Deirdre Evener, RN on: 08/10/2014  1:33 PM<BR>     Documentation filed: Inpatient Patient Education

## 2014-08-13 ENCOUNTER — Ambulatory Visit
Admission: RE | Admit: 2014-08-13 | Discharge: 2014-08-13 | Disposition: A | Discharge status: Home / Self Care | Payer: Medicare Other | Source: Ambulatory Visit | Attending: Radiation Oncology | Admitting: Radiation Oncology

## 2014-08-13 ENCOUNTER — Encounter: Payer: Self-pay | Admitting: Radiation Oncology

## 2014-08-13 VITALS — BP 180/84 | HR 74 | Temp 97.9°F | Resp 10 | Wt 118.4 lb

## 2014-08-13 DIAGNOSIS — Z510 Encounter for antineoplastic radiation therapy: Secondary | ICD-10-CM | POA: Diagnosis not present

## 2014-08-13 DIAGNOSIS — C50112 Malignant neoplasm of central portion of left female breast: Secondary | ICD-10-CM

## 2014-08-13 NOTE — Progress Notes (Signed)
   Weekly Management Note:  outpatient    ICD-9-CM ICD-10-CM   1. Malignant neoplasm of central portion of female breast, left 174.1 C50.112     Current Dose:  13.35 Gy  Projected Dose: 50.05 Gy   Narrative:  The patient presents for routine under treatment assessment.  CBCT/MVCT images/Port film x-rays were reviewed.  The chart was checked. No changes noted in tumor over chest  Physical Findings:  weight is 118 lb 6.4 oz (53.706 kg). Her oral temperature is 97.9 F (36.6 C). Her blood pressure is 180/84 and her pulse is 74. Her respiration is 10 and oxygen saturation is 99%.  exam relatively stable, left breast  Impression:  The patient is tolerating radiotherapy.  Plan:  Continue radiotherapy as planned.   ________________________________   Eppie Gibson, M.D.

## 2014-08-13 NOTE — Progress Notes (Signed)
She is currently in no pain.  Pt left breast Noted three area that are a dark green crust and a large approx 2x2 wound that is red and tender, pt reports it continues to bleed. She continues to apply Radiaplex and is keeping the large wound dry. Pt refused to let her breast be open to air and insist on wearing a bra.  She reports the air causes more discomfort.  She reports the pad/drsg that she wears is getting completely saturated approx 3 times a day.  She reports she is getting frustrated the radiation therapy is not working, she thought she would see results by now. Gave pt some non stick pads. The patient eats a regular, healthy diet.Marland Kitchen

## 2014-08-14 ENCOUNTER — Ambulatory Visit
Admission: RE | Admit: 2014-08-14 | Discharge: 2014-08-14 | Disposition: A | Discharge status: Home / Self Care | Payer: Medicare Other | Source: Ambulatory Visit | Attending: Radiation Oncology | Admitting: Radiation Oncology

## 2014-08-14 DIAGNOSIS — Z510 Encounter for antineoplastic radiation therapy: Secondary | ICD-10-CM | POA: Diagnosis not present

## 2014-08-15 ENCOUNTER — Ambulatory Visit
Admission: RE | Admit: 2014-08-15 | Discharge: 2014-08-15 | Disposition: A | Discharge status: Home / Self Care | Payer: Medicare Other | Source: Ambulatory Visit | Attending: Radiation Oncology | Admitting: Radiation Oncology

## 2014-08-15 DIAGNOSIS — Z510 Encounter for antineoplastic radiation therapy: Secondary | ICD-10-CM | POA: Diagnosis not present

## 2014-08-16 ENCOUNTER — Ambulatory Visit
Admission: RE | Admit: 2014-08-16 | Discharge: 2014-08-16 | Disposition: A | Discharge status: Home / Self Care | Payer: Medicare Other | Source: Ambulatory Visit | Attending: Radiation Oncology | Admitting: Radiation Oncology

## 2014-08-16 DIAGNOSIS — Z510 Encounter for antineoplastic radiation therapy: Secondary | ICD-10-CM | POA: Diagnosis not present

## 2014-08-17 ENCOUNTER — Ambulatory Visit
Admission: RE | Admit: 2014-08-17 | Discharge: 2014-08-17 | Disposition: A | Discharge status: Home / Self Care | Payer: Medicare Other | Source: Ambulatory Visit | Attending: Radiation Oncology | Admitting: Radiation Oncology

## 2014-08-17 DIAGNOSIS — Z510 Encounter for antineoplastic radiation therapy: Secondary | ICD-10-CM | POA: Diagnosis not present

## 2014-08-20 ENCOUNTER — Encounter: Payer: Self-pay | Admitting: Radiation Oncology

## 2014-08-20 ENCOUNTER — Ambulatory Visit
Admission: RE | Admit: 2014-08-20 | Discharge: 2014-08-20 | Disposition: A | Discharge status: Home / Self Care | Payer: Medicare Other | Source: Ambulatory Visit | Attending: Radiation Oncology | Admitting: Radiation Oncology

## 2014-08-20 VITALS — BP 148/84 | HR 84 | Temp 97.7°F | Resp 10 | Wt 115.5 lb

## 2014-08-20 DIAGNOSIS — C50112 Malignant neoplasm of central portion of left female breast: Secondary | ICD-10-CM

## 2014-08-20 DIAGNOSIS — Z510 Encounter for antineoplastic radiation therapy: Secondary | ICD-10-CM | POA: Diagnosis not present

## 2014-08-20 NOTE — Progress Notes (Signed)
She is currently in no pain.  Pt left breast  positive for breast tenderness, noted multiple areas that are open.  Unchanged.  Large wound opened over nipple area, continues to drain red blood. No swelling.

## 2014-08-20 NOTE — Progress Notes (Signed)
   Weekly Management Note:  outpatient    ICD-9-CM ICD-10-CM   1. Malignant neoplasm of central portion of female breast, left 174.1 C50.112     Current Dose:  26.7 Gy  Projected Dose: 50.05 Gy   Narrative:  The patient presents for routine under treatment assessment.  CBCT/MVCT images/Port film x-rays were reviewed.  The chart was checked. Bleeding slowing down from tumor  Physical Findings:  weight is 115 lb 8 oz (52.39 kg). Her oral temperature is 97.7 F (36.5 C). Her blood pressure is 148/84 and her pulse is 84. Her respiration is 10 and oxygen saturation is 100%.  breast exam relatively stable  Impression:  The patient is tolerating radiotherapy.  Plan:  Continue radiotherapy as planned.   ________________________________   Eppie Gibson, M.D.

## 2014-08-21 ENCOUNTER — Ambulatory Visit
Admission: RE | Admit: 2014-08-21 | Discharge: 2014-08-21 | Disposition: A | Discharge status: Home / Self Care | Payer: Medicare Other | Source: Ambulatory Visit | Attending: Radiation Oncology | Admitting: Radiation Oncology

## 2014-08-21 DIAGNOSIS — Z510 Encounter for antineoplastic radiation therapy: Secondary | ICD-10-CM | POA: Diagnosis not present

## 2014-08-22 ENCOUNTER — Ambulatory Visit
Admission: RE | Admit: 2014-08-22 | Discharge: 2014-08-22 | Disposition: A | Discharge status: Home / Self Care | Payer: Medicare Other | Source: Ambulatory Visit | Attending: Radiation Oncology | Admitting: Radiation Oncology

## 2014-08-22 DIAGNOSIS — Z510 Encounter for antineoplastic radiation therapy: Secondary | ICD-10-CM | POA: Diagnosis not present

## 2014-08-23 ENCOUNTER — Ambulatory Visit
Admission: RE | Admit: 2014-08-23 | Discharge: 2014-08-23 | Disposition: A | Discharge status: Home / Self Care | Payer: Medicare Other | Source: Ambulatory Visit | Attending: Radiation Oncology | Admitting: Radiation Oncology

## 2014-08-23 DIAGNOSIS — Z510 Encounter for antineoplastic radiation therapy: Secondary | ICD-10-CM | POA: Diagnosis not present

## 2014-08-24 ENCOUNTER — Encounter: Payer: Self-pay | Admitting: Radiation Oncology

## 2014-08-24 ENCOUNTER — Ambulatory Visit
Admission: RE | Admit: 2014-08-24 | Discharge: 2014-08-24 | Disposition: A | Discharge status: Home / Self Care | Payer: Medicare Other | Source: Ambulatory Visit | Attending: Radiation Oncology | Admitting: Radiation Oncology

## 2014-08-24 ENCOUNTER — Telehealth: Payer: Self-pay | Admitting: Hematology

## 2014-08-24 VITALS — BP 142/84 | HR 79 | Temp 98.0°F | Ht 62.0 in | Wt 115.4 lb

## 2014-08-24 DIAGNOSIS — Z510 Encounter for antineoplastic radiation therapy: Secondary | ICD-10-CM | POA: Diagnosis not present

## 2014-08-24 DIAGNOSIS — C50112 Malignant neoplasm of central portion of left female breast: Secondary | ICD-10-CM

## 2014-08-24 NOTE — Progress Notes (Signed)
   Weekly Management Note:  outpatient    ICD-9-CM ICD-10-CM   1. Malignant neoplasm of central portion of female breast, left 174.1 C50.112     Current Dose: 37.38 Gy  Projected Dose: 50.05 Gy   Narrative:  The patient presents for routine under treatment assessment.  CBCT/MVCT images/Port film x-rays were reviewed.  The chart was checked. Further response from RT - she is pleased  Physical Findings:  height is 5\' 2"  (1.575 m) and weight is 115 lb 6.4 oz (52.345 kg). Her temperature is 98 F (36.7 C). Her blood pressure is 142/84 and her pulse is 79. Her oxygen saturation is 98%.  breast exam - superficial crusting sloughed off from tumors  Impression:  The patient is responding to radiotherapy.  Plan:  Continue radiotherapy as planned.   ________________________________   Eppie Gibson, M.D.

## 2014-08-24 NOTE — Progress Notes (Signed)
[name] in today without any voiced concerns regarding radiation therapy to her breast. Denies any skin changes  Denies fatigue.

## 2014-08-24 NOTE — Telephone Encounter (Signed)
per stacy moved 11/27 appt from [facility] to [place]. appt d/t remain the same. lmonvm informing pt and mailed schedule.

## 2014-08-26 ENCOUNTER — Ambulatory Visit
Admission: RE | Admit: 2014-08-26 | Discharge: 2014-08-26 | Disposition: A | Discharge status: Home / Self Care | Payer: Medicare Other | Source: Ambulatory Visit | Attending: Radiation Oncology | Admitting: Radiation Oncology

## 2014-08-26 DIAGNOSIS — Z510 Encounter for antineoplastic radiation therapy: Secondary | ICD-10-CM | POA: Diagnosis not present

## 2014-08-27 ENCOUNTER — Ambulatory Visit
Admission: RE | Admit: 2014-08-27 | Discharge: 2014-08-27 | Disposition: A | Discharge status: Home / Self Care | Payer: Medicare Other | Source: Ambulatory Visit | Attending: Radiation Oncology | Admitting: Radiation Oncology

## 2014-08-27 ENCOUNTER — Encounter: Payer: Self-pay | Admitting: Radiation Oncology

## 2014-08-27 ENCOUNTER — Ambulatory Visit: Payer: Medicare Other

## 2014-08-27 VITALS — BP 152/90 | HR 100 | Temp 97.9°F | Ht 62.0 in | Wt 117.1 lb

## 2014-08-27 DIAGNOSIS — C50112 Malignant neoplasm of central portion of left female breast: Secondary | ICD-10-CM

## 2014-08-27 DIAGNOSIS — Z510 Encounter for antineoplastic radiation therapy: Secondary | ICD-10-CM | POA: Diagnosis not present

## 2014-08-27 NOTE — Progress Notes (Signed)
Ms. Zolman has received 16 fractions to her left breast.  She denies any pain. The open area on  The lateral side of her breast is without any drainage and the ope area on the medial side of her breast has dried blood on the periphery. Erythema noted throughout the treatment field. Wearing sports bra.

## 2014-08-27 NOTE — Progress Notes (Signed)
Simulation Verification Note    ICD-9-CM ICD-10-CM   1. Malignant neoplasm of central portion of female breast, left 174.1 C50.112     The patient was brought to the treatment unit and placed in the planned treatment position. The clinical setup was verified. The position, location, and shape of the electron light field was reviewed. The targeted tissue appears to be appropriately covered by the beam, which is en face with the patient's skin. The patient's treatment will proceed as planned.  -----------------------------------  Eppie Gibson, MD

## 2014-08-27 NOTE — Progress Notes (Signed)
   Weekly Management Note:  outpatient    ICD-9-CM ICD-10-CM   1. Malignant neoplasm of central portion of female breast, left 174.1 C50.112     Current Dose: 42.5 Gy  Projected Dose: 50.05 Gy   Narrative:  The patient presents for routine under treatment assessment.  CBCT/MVCT images/Port film x-rays were reviewed.  The chart was checked. Further response from RT - she is still pleased  Physical Findings:  height is 5\' 2"  (1.575 m) and weight is 117 lb 1.6 oz (53.116 kg). Her temperature is 97.9 F (36.6 C). Her blood pressure is 152/90 and her pulse is 100. Her oxygen saturation is 100%.  breast exam - similar to prior. Minimal residual crusting.  ulcerative tumors without signs of infection  Impression:  The patient is responding to radiotherapy.  Plan:  Continue radiotherapy as planned.   ________________________________   Eppie Gibson, M.D.

## 2014-08-28 ENCOUNTER — Ambulatory Visit: Payer: Medicare Other

## 2014-08-28 ENCOUNTER — Ambulatory Visit
Admission: RE | Admit: 2014-08-28 | Discharge: 2014-08-28 | Disposition: A | Discharge status: Home / Self Care | Payer: Medicare Other | Source: Ambulatory Visit | Attending: Radiation Oncology | Admitting: Radiation Oncology

## 2014-08-28 DIAGNOSIS — Z510 Encounter for antineoplastic radiation therapy: Secondary | ICD-10-CM | POA: Diagnosis not present

## 2014-08-29 ENCOUNTER — Ambulatory Visit
Admission: RE | Admit: 2014-08-29 | Discharge: 2014-08-29 | Disposition: A | Discharge status: Home / Self Care | Payer: Medicare Other | Source: Ambulatory Visit | Attending: Radiation Oncology | Admitting: Radiation Oncology

## 2014-08-29 ENCOUNTER — Encounter: Payer: Self-pay | Admitting: Radiation Oncology

## 2014-08-29 ENCOUNTER — Ambulatory Visit: Payer: Medicare Other

## 2014-08-29 DIAGNOSIS — Z510 Encounter for antineoplastic radiation therapy: Secondary | ICD-10-CM | POA: Diagnosis not present

## 2014-08-31 ENCOUNTER — Other Ambulatory Visit: Payer: Self-pay | Admitting: *Deleted

## 2014-08-31 ENCOUNTER — Other Ambulatory Visit (HOSPITAL_BASED_OUTPATIENT_CLINIC_OR_DEPARTMENT_OTHER): Payer: Medicare Other

## 2014-08-31 ENCOUNTER — Encounter: Payer: Self-pay | Admitting: Hematology

## 2014-08-31 ENCOUNTER — Ambulatory Visit (HOSPITAL_BASED_OUTPATIENT_CLINIC_OR_DEPARTMENT_OTHER): Payer: Medicare Other | Admitting: Hematology

## 2014-08-31 ENCOUNTER — Telehealth: Payer: Self-pay | Admitting: Hematology

## 2014-08-31 ENCOUNTER — Ambulatory Visit: Payer: Medicare Other

## 2014-08-31 VITALS — BP 188/90 | HR 62 | Temp 97.9°F | Resp 20 | Ht 62.0 in | Wt 117.0 lb

## 2014-08-31 DIAGNOSIS — C50912 Malignant neoplasm of unspecified site of left female breast: Secondary | ICD-10-CM

## 2014-08-31 DIAGNOSIS — C773 Secondary and unspecified malignant neoplasm of axilla and upper limb lymph nodes: Secondary | ICD-10-CM

## 2014-08-31 DIAGNOSIS — D649 Anemia, unspecified: Secondary | ICD-10-CM

## 2014-08-31 DIAGNOSIS — C50412 Malignant neoplasm of upper-outer quadrant of left female breast: Secondary | ICD-10-CM

## 2014-08-31 DIAGNOSIS — C7800 Secondary malignant neoplasm of unspecified lung: Secondary | ICD-10-CM

## 2014-08-31 DIAGNOSIS — C787 Secondary malignant neoplasm of liver and intrahepatic bile duct: Secondary | ICD-10-CM

## 2014-08-31 DIAGNOSIS — I10 Essential (primary) hypertension: Secondary | ICD-10-CM

## 2014-08-31 LAB — CBC WITH DIFFERENTIAL/PLATELET
BASO%: 1 % (ref 0.0–2.0)
Basophils Absolute: 0 10*3/uL (ref 0.0–0.1)
EOS ABS: 0.1 10*3/uL (ref 0.0–0.5)
EOS%: 1.9 % (ref 0.0–7.0)
HCT: 34.2 % — ABNORMAL LOW (ref 34.8–46.6)
HGB: 11.1 g/dL — ABNORMAL LOW (ref 11.6–15.9)
LYMPH%: 12.8 % — ABNORMAL LOW (ref 14.0–49.7)
MCH: 33.6 pg (ref 25.1–34.0)
MCHC: 32.3 g/dL (ref 31.5–36.0)
MCV: 104 fL — ABNORMAL HIGH (ref 79.5–101.0)
MONO#: 0.6 10*3/uL (ref 0.1–0.9)
MONO%: 14.4 % — ABNORMAL HIGH (ref 0.0–14.0)
NEUT%: 69.9 % (ref 38.4–76.8)
NEUTROS ABS: 3 10*3/uL (ref 1.5–6.5)
Platelets: 180 10*3/uL (ref 145–400)
RBC: 3.29 10*6/uL — AB (ref 3.70–5.45)
RDW: 15 % — AB (ref 11.2–14.5)
WBC: 4.3 10*3/uL (ref 3.9–10.3)
lymph#: 0.6 10*3/uL — ABNORMAL LOW (ref 0.9–3.3)

## 2014-08-31 LAB — COMPREHENSIVE METABOLIC PANEL (CC13)
ALK PHOS: 72 U/L (ref 40–150)
ALT: 16 U/L (ref 0–55)
ANION GAP: 8 meq/L (ref 3–11)
AST: 20 U/L (ref 5–34)
Albumin: 3.3 g/dL — ABNORMAL LOW (ref 3.5–5.0)
BUN: 13.8 mg/dL (ref 7.0–26.0)
CO2: 27 meq/L (ref 22–29)
Calcium: 9 mg/dL (ref 8.4–10.4)
Chloride: 103 mEq/L (ref 98–109)
Creatinine: 0.7 mg/dL (ref 0.6–1.1)
GLUCOSE: 108 mg/dL (ref 70–140)
POTASSIUM: 4.6 meq/L (ref 3.5–5.1)
Sodium: 137 mEq/L (ref 136–145)
TOTAL PROTEIN: 6 g/dL — AB (ref 6.4–8.3)
Total Bilirubin: 0.22 mg/dL (ref 0.20–1.20)

## 2014-08-31 LAB — HOLD TUBE, BLOOD BANK

## 2014-08-31 MED ORDER — PALBOCICLIB 75 MG PO CAPS: 75.0000 mg | ORAL_CAPSULE | Freq: Every day | ORAL | Status: DC

## 2014-08-31 NOTE — Telephone Encounter (Signed)
Was unable to leave a full VM due to it cutting off. Mailed cal for Dec appts.

## 2014-08-31 NOTE — Progress Notes (Signed)
Patient states she has had flu vaccine this fall.  She is not sure of the date, but will check her records and let us know.

## 2014-08-31 NOTE — Progress Notes (Signed)
Hematology and Oncology Follow Up Visit Date of Visit: 08/03/2014  [name] [id number] 1921-10-16 80 y.o.  CC: Velna Hatchet, MDHolwerda, Nicki Reaper, MD   Oncology history and prior treatment   Malignant neoplasm of central portion of female breast   01/02/2009 Initial Diagnosis Malignant neoplasm of left breast, [id number], stage IIIB. ER 100% (+), PR 90%(+), HER2 (-), Ki67 45%. Pt declined surgery , RT and chemo.    02/02/2009 - 12/14/2012 Anti-estrogen oral therapy 02/02/2009-12/14/2012: Femara, with good response, stopped on local progression    12/14/2012 - 02/13/2014 Anti-estrogen oral therapy Faslodex, stopped due to progression    OTHER RELATED ISSUES: 1. Cystic masses involving the right breast with negative biopsy on 01/02/2009.  2. Iron-deficiency anemia requiring hospitalization, 4 units of blood and intravenous iron. The patient had positive stools but a negative colonoscopy on 02/28/2009. Feraheme 510 mg IV was given on 11/06/2011, 11/13/2011, 08/12/2012 and 08/19/2012 and 07/20/2014.   Current therapy:   1. Femara 2.5 mg daily plus palbociclib started in June 2015. Palbociclib dose reduced to 75 mg daily for 2 weeks on and one week off due to neutropenia (no infection), and it has been held since Oct due to radiation.  2. Palliative radiation to left breast: started 08/06/2014, plan to finish 09/04/14.   Current disease distribution: Left breast (primary), small pulmonary metastases, small focus in right breast, new 1.5cm liver lesion (03/2014 scan)  Chief complain: Follow up breast cancer   Interim History:   [name] returns for follow-up and she is accompanied today by her daughter, [name].  She was previously seen by Dr. Lona Kettle, who has left the practice. She was transitioned to me.  She continues doing well with radiation and Femara. She is going to finish her radiation next Tuesday, 09/04/2014. She has been tolerated it well without significant problems. She reports bleeding and  discharge from her left breast cancer is getting less since she started radiation. She denies any other symptoms. She has good energy and appetite, weight is stable lately  SMOKING HISTORY: The patient has never smoked cigarettes.  Medications: I have reviewed the patient's current medications.  Current Outpatient Prescriptions  Medication Sig Dispense Refill  . aspirin 81 MG tablet Take 81 mg by mouth daily.     . Cholecalciferol 5000 UNITS TABS Take 5,000 Units by mouth daily.    . Ferrous Sulfate (IRON) 325 (65 FE) MG TABS Take 325 mg by mouth daily.    . irbesartan (AVAPRO) 150 MG tablet Take 1 tablet by mouth 2 (two) times daily.    Marland Kitchen letrozole (FEMARA) 2.5 MG tablet Take 1 tablet (2.5 mg total) by mouth daily. 90 tablet 2  . Multiple Vitamin (MULTIVITAMIN) tablet Take 1 tablet by mouth daily.    Marland Kitchen senna (SENOKOT) 8.6 MG tablet Take 1 tablet by mouth as needed.    . vitamin B-12 (CYANOCOBALAMIN) 100 MCG tablet Take 50 mcg by mouth daily.    Derrill Memo ON 09/10/2014] palbociclib (IBRANCE) 75 MG capsule Take 1 capsule (75 mg total) by mouth daily with breakfast. Take whole with food, for 14 days then off 7 days 14 capsule 5   No current facility-administered medications for this visit.   Allergies:  Allergies  Allergen Reactions  . Ace Inhibitors Itching  . Amlodipine Nausea Only and Rash    Leg cramps  . Lisinopril Rash    Past Medical History, Surgical history, Social history, and Family History were reviewed and updated.  Review of Systems: Constitutional:  Negative  for fever, chills, night sweats, anorexia, weight loss, pain. Cardiovascular: no chest pain or dyspnea on exertion Respiratory: no cough, shortness of breath, or wheezing Neurological: no TIA or stroke symptoms Dermatological: negative for lumps and skin lesion changes ENT: negative for - epistaxis Skin: Negative. Gastrointestinal: no abdominal pain, change in bowel habits, or black or bloody  stools Genito-Urinary: no dysuria, trouble voiding, or hematuria Hematological and Lymphatic: negative for - bleeding problems or bruising Breast: (+) left breat mass with open wound and bloody discharge Musculoskeletal: negative for - gait disturbance Remaining ROS negative.  Physical Exam: Blood pressure 188/90, pulse 62, temperature 97.9 F (36.6 C), temperature source Oral, resp. rate 20, height 5' 2"  (1.575 m), weight 117 lb (53.071 kg). ECOG: 1 General appearance: alert, cooperative, appears stated age and no distress Head: Normocephalic, without obvious abnormality, atraumatic Neck: no adenopathy, supple, symmetrical, trachea midline and thyroid not enlarged, symmetric, no tenderness/mass/nodules Lymph nodes: Cervical adenopathy: None appreciated and Supraclavicular adenopathy: None appreciated Heart:regular rate and rhythm, S1, S2 normal, no murmur, click, rub or gallop Lung:chest clear, no wheezing, rales, normal symmetric air entry, Heart exam - S1, S2 normal, no murmur, no gallop, rate regular Breast: left breast mass about 4-5 cm fungating and vascular, with a 2cm open wound, no significant discharge. Right breast exam revealed no mass or skin changes  Abdomin: soft, non-tender, without masses or organomegaly EXT:No peripheral edema Neuro: No focal deficits  Lab Results:  Results for DONDA, FRIEDLI (MRN [id number]) as of 08/31/2014 15:17  Ref. Range 07/20/2014 13:42 08/03/2014 08:30 08/03/2014 08:30 08/31/2014 08:14 08/31/2014 08:15  Sodium Latest Range: 136-145 mEq/L   139  137  Potassium Latest Range: 3.5-5.1 mEq/L   4.1  4.6  Chloride Latest Range: 96-112 mEq/L   106  103  CO2 Latest Range: 19-32 mEq/L   26  27  BUN Latest Range: 7.0-26.0 mg/dL   10.0  13.8  Creatinine Latest Range: 0.50-1.10 mg/dL   0.7  0.7  Calcium Latest Range: 8.4-10.5 mg/dL   8.9  9.0  Glucose Latest Range: 70-99 mg/dl   116  108  Anion gap Latest Range: 3-11 mEq/L   7  8  Alkaline Phosphatase  Latest Range: 39-117 U/L   70  72  Albumin Latest Range: 3.5-5.2 g/dL   3.4 (L)  3.3 (L)  AST Latest Range: 0-37 U/L   23  20  ALT Latest Range: 0-35 U/L   25  16  Total Protein Latest Range: 6.4-8.3 g/dL   6.2 (L)  6.0 (L)  Total Bilirubin Latest Range: 0.3-1.2 mg/dL   0.30  0.22  WBC No range found 4.7 4.5  4.3   RBC No range found 3.16 (L) 3.06 (L)  3.29 (L)   Hemoglobin No range found 10.4 (L) 10.5 (L)  11.1 (L)   HCT No range found 32.5 (L) 32.2 (L)  34.2 (L)   MCV No range found 103.0 (H) 105.1 (H)  104.0 (H)   MCH Latest Range: 25.1-34.0 pg 33.0 34.3 (H)  33.6   MCHC No range found 32.1 32.6  32.3   RDW No range found 20.0 (H) 18.4 (H)  15.0 (H)   Platelets No range found 192 229  180       Radiological Studies: 1. Chest x-ray on 06/27/2010 showed no acute cardiopulmonary process.  2. Digital diagnostic bilateral mammograms on 07/04/2010 showed the masses in both breasts.  3. Digital diagnostic bilateral mammograms carried out on 07/31/2011 showed a dense spiculated mass containing  central biopsy clip  artifact in the slightly upper outer left breast with associated nipple and skin retraction without significant change since the most recent prior study of 07/04/2010. On mammogram, this mass measures approximately 2.7 x 2.5 x 2.9 cm. No new mass is identified in the left breast. On the right side, the circumscribed more superiorly and posteriorly positioned mass has decreased in size since 07/04/2010. On the mammogram, it currently measures 3.2 x 3.2 x 2.8 cm, whereas previously it measured 4.0 x 3.8 x 3.4 cm. The more irregular mass in the upper central right breast just anterior to the circumscribed mass is without significant change. It measured approximately 2.9 cm in greatest dimension and is felt to be stable. No new masses identified in the right breast.  4. Chest x-ray, 2 view, from 08/05/2012 showed a large hiatal hernia, but no acute cardiopulmonary disease.  5. Digital  diagnostic bilateral mammogram and bilateral breast ultrasound were carried out on 08/12/2012. These studies were compared with the study done from 07/31/2011. There was suggestion of progression. A mass in the left breast that measured 3.3 cm on 11/08 had previously measured 2.6 cm by mammogram. There was another mass seen in the lower central aspect of the left breast measuring 1.1 cm whereas previously on 07/31/2011 it measured 0.6 cm. Ultrasound was carried out on 08/12/2012. The dominant mass in the left breast measured 2.5 x 2.4 x 3.0 cm by ultrasound. The smaller lesion by ultrasound measured 1.1 x 0.7 x 0.9 cm. I do not believe we had an ultrasound last year for comparison. A lymph node was seen in the left axilla measuring 1.1 x 0.9 x 1.4 cm. There was obliteration of the fatty hilum. There were also by ultrasound masses in the right breast. A mass in the superior aspect of the right breast measured 2.4 x 2.6 x 0.6 cm. This mass was located 3 cm from the nipple. A second mass which was located 1 cm from the nipple measured 1.0 x 0.8 x 1.1 cm. It was felt that the known biopsy proven malignancy in the left breast was progressing. The masses in the right breast were decreasing.  6. Mammogram and ultrasound of the left breast from 08/12/2012 suggested progression compared with the prior mammogram of 07/31/2011. The mass which is seen in about the 3 o'clock position now measures about 3.3 cm as compared with 2.6 cm previously. There was another mass seen at around the 6 o'clock position that measures 1.1 cm, whereas previously it measured 0.6 cm. There was also felt to be a lymph node in the left axilla measuring about 1.4 cm in maximum dimension. There was some obliteration of the fatty hilum suggesting that this may, in fact, be replaced by cancer. Ultrasound exam of the right breast showed a mass in the superior aspect measuring 2.4 x 2.6 x 0.6 cm. The mass is located about 3 cm from the nipple. There was a  second mass located about 1 cm from the nipple and this measured about 1.1 cm. The masses  in the right breast have decreased compared with prior studies, whereas the malignancy in the left breast seems to be progressing.  7. PET scan carried out on 09/09/2012 was compared with the prior PET scan from 01/04/2009. Once again there are 2 lesions seen in the left breast. The larger lesion is located at about the 3 o'clock position. There is a smaller lesion at about 6 o'clock which may be new, as well as  a left axillary lymph node. There was previously a dumbbell-shaped lesion in the right breast which was quite large. There is now a small residual focus corresponding to the upper part of the previous dumbbell lesion with some increased activity which raises concern that this residual lesion may be malignant. In reviewing the original PET scan from 01/04/2009, it appears that the patient may have had some small pulmonary metastases which had regressed on treatment with Femara. Some of the lesions are still present on the most recent PET scan from 09/09/2012, but clearly are smaller.  8. Digital diagnostic mammogram of the left breast on 12/02/2012 shows progression compared with the prior study from 08/12/2012. The larger lesion at about 3 o'clock in the left breast now is felt to measure 3.8 cm in maximum dimension. The second lesion at about 6 o'clock measures 1.1 cm in maximum dimension.  9. Chest x-ray, 2 view, from 12/02/2012 showed some emphysematous changes, but no acute cardiopulmonary findings. There was a large hiatal hernia. 10. CT Chest w contrast on 03/16/2014 (personally reviewed by me).  Progressive enlargement of dominant left breast mass with multiple new breasts masses consistent with metastases.  Deep mass in the right breast has not significantly changed. Multiple new pulmonary nodules consistent with metastatic disease.  New hepatic lesions suspicious for metastatic disease   Impression and  Plan:  1. Left breast cancer with mets to liver and lungs, Stage IV.  -she is doing well overall, responding to palliative radiation to left breast, with less bloody discharge. -Continue Femara 2.5 mg daily. We'll restart palpable cystic palbociclib 75 mg daily for 14 days then 7 days off, 1 week after she completes radiation, i.e., 09/10/14. Refill was called in to [facility]. Due to her prior history of neutropenia on palbo, we'll watch his CBC and differential weekly. -We'll repeat her staging scan in 2 months to give out at her response to Femara and palbo.   2. Anemia, history of iron deficient anemia -Her last iron level was high, I'll repeat her iron study next visit. If her ferritin level remains high, I'll hold IV Feraheme. -No need for blood transfusion now.  3. Hypertension -Her BP was high when she checked in today. Per her daughter her blood pressure was normal at home. She knows to monitor her blood pressure at home.   I spent 25 minutes counseling the patient face to face. The total time spent in the appointment was 40 minutes.  Truitt Merle MD  08/31/2014

## 2014-09-03 ENCOUNTER — Telehealth: Payer: Self-pay | Admitting: Hematology

## 2014-09-03 ENCOUNTER — Ambulatory Visit
Admission: RE | Admit: 2014-09-03 | Discharge: 2014-09-03 | Disposition: A | Discharge status: Home / Self Care | Payer: Medicare Other | Source: Ambulatory Visit | Attending: Radiation Oncology | Admitting: Radiation Oncology

## 2014-09-03 ENCOUNTER — Encounter: Payer: Self-pay | Admitting: Radiation Oncology

## 2014-09-03 VITALS — BP 170/88 | HR 84 | Temp 98.0°F | Ht 62.0 in | Wt 117.0 lb

## 2014-09-03 DIAGNOSIS — Z510 Encounter for antineoplastic radiation therapy: Secondary | ICD-10-CM | POA: Diagnosis not present

## 2014-09-03 DIAGNOSIS — C50112 Malignant neoplasm of central portion of left female breast: Secondary | ICD-10-CM

## 2014-09-03 NOTE — Progress Notes (Signed)
   Weekly Management Note:  Outpatient    ICD-9-CM ICD-10-CM   1. Malignant neoplasm of central portion of female breast, left 174.1 C50.112     Current Dose:  48.05 Gy  Projected Dose: 50.05 Gy   Narrative:  The patient presents for routine under treatment assessment.  CBCT/MVCT images/Port film x-rays were reviewed.  The chart was checked. Skin is redder over breast, but tumors haven't bleed in 2 days  Physical Findings:  height is 5\' 2"  (1.575 m) and weight is 117 lb (53.071 kg). Her temperature is 98 F (36.7 C). Her blood pressure is 170/88 and her pulse is 84.  moist ulceration without bleeding over main left breast tumors. Dry erythema over left breast  Impression:  The patient is tolerating radiotherapy.  Plan:  Continue radiotherapy as planned. F/u in 5-6 wks. Apply biafine to erythematous dry skin  ________________________________   Eppie Gibson, M.D.

## 2014-09-03 NOTE — Telephone Encounter (Signed)
pt dtr called and r/s 12/14 appts to 10/12/14. dtr has new d/t.

## 2014-09-03 NOTE — Progress Notes (Signed)
[name] has received 19 fractions to her left breast.  Ulcerated area are without any drainage today.  Note bright erythema of the breast.  Firm to touch with reported tenderness. [name] denies any fatigue at this time.  Accompanied by her daughter.

## 2014-09-04 ENCOUNTER — Ambulatory Visit: Payer: Medicare Other

## 2014-09-05 ENCOUNTER — Ambulatory Visit
Admission: RE | Admit: 2014-09-05 | Discharge: 2014-09-05 | Disposition: A | Discharge status: Home / Self Care | Payer: Medicare Other | Source: Ambulatory Visit | Attending: Radiation Oncology | Admitting: Radiation Oncology

## 2014-09-05 ENCOUNTER — Ambulatory Visit: Payer: Medicare Other

## 2014-09-05 DIAGNOSIS — Z510 Encounter for antineoplastic radiation therapy: Secondary | ICD-10-CM | POA: Diagnosis not present

## 2014-09-06 ENCOUNTER — Ambulatory Visit: Payer: Medicare Other

## 2014-09-06 ENCOUNTER — Encounter: Payer: Self-pay | Admitting: Radiation Oncology

## 2014-09-06 NOTE — Progress Notes (Signed)
  Radiation Oncology         [phone] ________________________________  Name: Myrtha Tonkovich MRN: [id number]  Date: 09/06/2014  DOB: 1922/08/13  End of Treatment Note  Diagnosis:   [id number] Left breast invasive ductal carcinoma, ER/PR positive HER-2/neu negative with pulmonary nodules and hepatic lesions     Indication for treatment:  palliative       Radiation treatment dates:   08/06/2014-09/06/2014  Site/dose:    1) left breast / 40.05 Gy in 15 fractions 2) left breast boost / 10 Gy in 5 fractions  Beams/energy:    1) opposed tangents / 6MV 2) electron boost / 15 MeV  Narrative: The patient tolerated radiation treatment relatively well with resolution of bleeding and decreased drainage from her breast tumors.  Plan: The patient has completed radiation treatment. The patient will return to radiation oncology clinic for routine followup in one month. I advised them to call or return sooner if they have any questions or concerns related to their recovery or treatment.  -----------------------------------  Eppie Gibson, MD

## 2014-09-17 ENCOUNTER — Ambulatory Visit: Payer: Medicare Other | Admitting: Hematology

## 2014-09-17 ENCOUNTER — Other Ambulatory Visit: Payer: Medicare Other

## 2014-09-17 NOTE — Progress Notes (Signed)
Electron Holiday representative Note  Diagnosis: Breast Cancer   The patient's CT images from her initial simulation were reviewed to plan her boost treatment to her L breast tumors.  Measurements were made regarding the size and depth of the surgical bed. The boost to the lumpectomy cavity will be delivered with 15 MeV electrons; 10 Gy in 5 fractions has been prescribed to the 100% isodose line.   A special port plan was reviewed and approved.  A custom electron cut-out will be used for her boost field.    -----------------------------------  Eppie Gibson, MD

## 2014-09-26 ENCOUNTER — Encounter: Payer: Self-pay | Admitting: Nurse Practitioner

## 2014-09-26 ENCOUNTER — Ambulatory Visit (HOSPITAL_COMMUNITY)
Admission: RE | Admit: 2014-09-26 | Discharge: 2014-09-26 | Disposition: A | Discharge status: Home / Self Care | Payer: Medicare Other | Source: Ambulatory Visit | Attending: Nurse Practitioner | Admitting: Nurse Practitioner

## 2014-09-26 ENCOUNTER — Telehealth: Payer: Self-pay | Admitting: *Deleted

## 2014-09-26 ENCOUNTER — Other Ambulatory Visit: Payer: Self-pay | Admitting: *Deleted

## 2014-09-26 ENCOUNTER — Ambulatory Visit (HOSPITAL_COMMUNITY): Admission: RE | Admit: 2014-09-26 | Payer: Medicare Other | Source: Ambulatory Visit

## 2014-09-26 ENCOUNTER — Ambulatory Visit (HOSPITAL_BASED_OUTPATIENT_CLINIC_OR_DEPARTMENT_OTHER): Payer: Medicare Other | Admitting: Nurse Practitioner

## 2014-09-26 ENCOUNTER — Ambulatory Visit (HOSPITAL_BASED_OUTPATIENT_CLINIC_OR_DEPARTMENT_OTHER): Payer: Medicare Other | Admitting: Lab

## 2014-09-26 ENCOUNTER — Other Ambulatory Visit: Payer: Self-pay | Admitting: Nurse Practitioner

## 2014-09-26 VITALS — BP 158/76 | HR 83 | Temp 97.6°F | Resp 18 | Ht 62.0 in | Wt 113.1 lb

## 2014-09-26 DIAGNOSIS — C50112 Malignant neoplasm of central portion of left female breast: Secondary | ICD-10-CM

## 2014-09-26 DIAGNOSIS — J90 Pleural effusion, not elsewhere classified: Secondary | ICD-10-CM | POA: Diagnosis not present

## 2014-09-26 DIAGNOSIS — M4806 Spinal stenosis, lumbar region: Secondary | ICD-10-CM | POA: Insufficient documentation

## 2014-09-26 DIAGNOSIS — K8020 Calculus of gallbladder without cholecystitis without obstruction: Secondary | ICD-10-CM | POA: Diagnosis not present

## 2014-09-26 DIAGNOSIS — C50919 Malignant neoplasm of unspecified site of unspecified female breast: Secondary | ICD-10-CM | POA: Diagnosis not present

## 2014-09-26 DIAGNOSIS — M47896 Other spondylosis, lumbar region: Secondary | ICD-10-CM | POA: Insufficient documentation

## 2014-09-26 DIAGNOSIS — D61810 Antineoplastic chemotherapy induced pancytopenia: Secondary | ICD-10-CM

## 2014-09-26 DIAGNOSIS — M4854XA Collapsed vertebra, not elsewhere classified, thoracic region, initial encounter for fracture: Secondary | ICD-10-CM | POA: Insufficient documentation

## 2014-09-26 DIAGNOSIS — C50912 Malignant neoplasm of unspecified site of left female breast: Secondary | ICD-10-CM

## 2014-09-26 DIAGNOSIS — T451X5A Adverse effect of antineoplastic and immunosuppressive drugs, initial encounter: Secondary | ICD-10-CM

## 2014-09-26 DIAGNOSIS — C7951 Secondary malignant neoplasm of bone: Secondary | ICD-10-CM | POA: Diagnosis not present

## 2014-09-26 DIAGNOSIS — M5489 Other dorsalgia: Secondary | ICD-10-CM

## 2014-09-26 DIAGNOSIS — K449 Diaphragmatic hernia without obstruction or gangrene: Secondary | ICD-10-CM | POA: Insufficient documentation

## 2014-09-26 DIAGNOSIS — IMO0002 Reserved for concepts with insufficient information to code with codable children: Secondary | ICD-10-CM

## 2014-09-26 DIAGNOSIS — M5136 Other intervertebral disc degeneration, lumbar region: Secondary | ICD-10-CM | POA: Insufficient documentation

## 2014-09-26 DIAGNOSIS — S21009A Unspecified open wound of unspecified breast, initial encounter: Secondary | ICD-10-CM | POA: Insufficient documentation

## 2014-09-26 DIAGNOSIS — M549 Dorsalgia, unspecified: Secondary | ICD-10-CM | POA: Diagnosis present

## 2014-09-26 DIAGNOSIS — C787 Secondary malignant neoplasm of liver and intrahepatic bile duct: Secondary | ICD-10-CM | POA: Insufficient documentation

## 2014-09-26 DIAGNOSIS — S21002A Unspecified open wound of left breast, initial encounter: Secondary | ICD-10-CM

## 2014-09-26 LAB — CBC WITH DIFFERENTIAL/PLATELET
BASO%: 0.9 % (ref 0.0–2.0)
BASOS ABS: 0 10*3/uL (ref 0.0–0.1)
EOS%: 0.9 % (ref 0.0–7.0)
Eosinophils Absolute: 0 10*3/uL (ref 0.0–0.5)
HEMATOCRIT: 32.1 % — AB (ref 34.8–46.6)
HEMOGLOBIN: 10.6 g/dL — AB (ref 11.6–15.9)
LYMPH%: 35.1 % (ref 14.0–49.7)
MCH: 33.2 pg (ref 25.1–34.0)
MCHC: 33 g/dL (ref 31.5–36.0)
MCV: 100.6 fL (ref 79.5–101.0)
MONO#: 0.2 10*3/uL (ref 0.1–0.9)
MONO%: 8.6 % (ref 0.0–14.0)
NEUT#: 1.2 10*3/uL — ABNORMAL LOW (ref 1.5–6.5)
NEUT%: 54.5 % (ref 38.4–76.8)
Platelets: 50 10*3/uL — ABNORMAL LOW (ref 145–400)
RBC: 3.19 10*6/uL — ABNORMAL LOW (ref 3.70–5.45)
RDW: 13.4 % (ref 11.2–14.5)
WBC: 2.2 10*3/uL — ABNORMAL LOW (ref 3.9–10.3)
lymph#: 0.8 10*3/uL — ABNORMAL LOW (ref 0.9–3.3)

## 2014-09-26 LAB — COMPREHENSIVE METABOLIC PANEL (CC13)
ALT: 14 U/L (ref 0–55)
AST: 19 U/L (ref 5–34)
Albumin: 3.3 g/dL — ABNORMAL LOW (ref 3.5–5.0)
Alkaline Phosphatase: 76 U/L (ref 40–150)
Anion Gap: 10 mEq/L (ref 3–11)
BUN: 20.3 mg/dL (ref 7.0–26.0)
CALCIUM: 8.9 mg/dL (ref 8.4–10.4)
CO2: 26 mEq/L (ref 22–29)
Chloride: 103 mEq/L (ref 98–109)
Creatinine: 1 mg/dL (ref 0.6–1.1)
EGFR: 51 mL/min/{1.73_m2} — ABNORMAL LOW (ref >90)
Glucose: 97 mg/dl (ref 70–140)
Potassium: 4.4 mEq/L (ref 3.5–5.1)
Sodium: 139 mEq/L (ref 136–145)
Total Protein: 6.2 g/dL — ABNORMAL LOW (ref 6.4–8.3)

## 2014-09-26 MED ORDER — GADOBENATE DIMEGLUMINE 529 MG/ML IV SOLN
10.0000 mL | Freq: Once | INTRAVENOUS | Status: AC | PRN
  Administered 2014-09-26: 10 mL via INTRAVENOUS

## 2014-09-26 MED ORDER — HYDROCODONE-ACETAMINOPHEN 5-325 MG PO TABS: 1.0000 | ORAL_TABLET | Freq: Four times a day (QID) | ORAL | Status: DC | PRN

## 2014-09-26 NOTE — Progress Notes (Signed)
will   SYMPTOM MANAGEMENT CLINIC   HPI: [name] 80 y.o. female diagnosed with breast cancer; with metastasis to both lung and liver.  Patient is status post radiation therapy completed on 09/04/2014.  Currently undergoing both letrozole and Ibrance oral therapy.     Patient called the cancer Center today requesting urgent care visit.  She is complaining of acute onset severe mid to lower back pain for approximately 1-2 weeks.  She denies any known injury or trauma.  She denies any neurological changes whatsoever.  She has been taken ibuprofen 800 mg intermittently with only minimal effectiveness.  Patient had re-initiated the Ibrance  oral therapy within this last week or so; but does help the past 3 days due to occasional nausea and acute onset back pain.  Patient denies any GI symptoms whatsoever.  She also denies any UTI symptoms.  She denies any recent fevers or chills.    HPI  ROS  Past Medical History  Diagnosis Date  . Anemia, unspecified   . Malignant neoplasm of breast (female), unspecified site   . Anemia, iron deficiency   . Hiatal hernia   . HTN (hypertension)   . Non-sustained ventricular tachycardia   . Cholelithiasis   . Breast cancer, left breast 10/28/2011  . GERD (gastroesophageal reflux disease)   . Pulmonary nodules/lesions, multiple 09/09/12    PET SCAN  . Use of fulvestrant (Faslodex) 12/14/12  . Erythema 12/14/12    Left breast - Underside  . Bleeding 12/14/12     Left Breast - Pt. Report bleeing and Stinging    History reviewed. No pertinent past surgical history.  has BLOOD IN STOOL, OCCULT; Anemia, iron deficiency; Preventative health care; Hiatal hernia; HTN (hypertension); Non-sustained ventricular tachycardia; Cholelithiasis; GERD (gastroesophageal reflux disease); Rash; Unspecified deficiency anemia; Malignant neoplasm of central portion of female breast; Pancytopenia due to antineoplastic chemotherapy; Breast wound; and Compression fracture on her  problem list.     is allergic to ace inhibitors; amlodipine; and lisinopril.    Medication List       This list is accurate as of: 09/26/14  6:13 PM.  Always use your most recent med list.               aspirin 81 MG tablet  Take 81 mg by mouth daily.     Cholecalciferol 5000 UNITS Tabs  Take 5,000 Units by mouth daily.     HYDROcodone-acetaminophen 5-325 MG per tablet  Commonly known as:  NORCO/VICODIN  Take 1-2 tablets by mouth every 6 (six) hours as needed for moderate pain.     irbesartan 150 MG tablet  Commonly known as:  AVAPRO  Take 1 tablet by mouth 2 (two) times daily.     Iron 325 (65 FE) MG Tabs  Take 325 mg by mouth daily.     letrozole 2.5 MG tablet  Commonly known as:  FEMARA  Take 1 tablet (2.5 mg total) by mouth daily.     multivitamin tablet  Take 1 tablet by mouth daily.     palbociclib 75 MG capsule  Commonly known as:  IBRANCE  Take 1 capsule (75 mg total) by mouth daily with breakfast. Take whole with food, for 14 days then off 7 days     senna 8.6 MG tablet  Commonly known as:  SENOKOT  Take 1 tablet by mouth as needed.     vitamin B-12 100 MCG tablet  Commonly known as:  CYANOCOBALAMIN  Take 50 mcg by mouth daily.  PHYSICAL EXAMINATION  Blood pressure 158/76, pulse 83, temperature 97.6 F (36.4 C), temperature source Oral, resp. rate 18, height 5' 2"  (1.575 m), weight 113 lb 1.6 oz (51.302 kg), SpO2 98 %.  Physical Exam  Constitutional: She is oriented to person, place, and time. Vital signs are normal. She appears malnourished. She appears unhealthy. She appears cachectic.  HENT:  Head: Normocephalic and atraumatic.  Mouth/Throat: Oropharyngeal exudate present.  Eyes: Conjunctivae and EOM are normal. Pupils are equal, round, and reactive to light. Right eye exhibits no discharge. Left eye exhibits no discharge. No scleral icterus.  Neck: Normal range of motion. Neck supple. No JVD present. No tracheal deviation present. No  thyromegaly present.  Cardiovascular: Normal rate, regular rhythm, normal heart sounds and intact distal pulses.   Pulmonary/Chest: Effort normal and breath sounds normal. No stridor. No respiratory distress. She has no wheezes. She has no rales. She exhibits no tenderness.  Abdominal: Soft. Bowel sounds are normal. She exhibits no distension and no mass. There is no tenderness. There is no rebound and no guarding.  Musculoskeletal: Normal range of motion. She exhibits tenderness. She exhibits no edema.  Patient has palpable tenderness to thoracic and lumbar region.  Patient observed with full range of motion; but does report some difficulty instability when walking due to the severe pain.  No evidence of injury or trauma on exam.  Lymphadenopathy:    She has no cervical adenopathy.  Neurological: She is alert and oriented to person, place, and time.  Skin: Skin is warm and dry. No rash noted. There is erythema. There is pallor.  Patient's left breast with a large, open wound noted approximate 2 to 3:00 region.  Also has a healing lesion directly above the open wound at approximately the 11:00 area.  Has an additional crusted/scabbed region at approximately the 9:00 region as well.  Entire left breast and left chest wall with erythema status post radiation therapy.  There is no edema or red streaks noted.  Psychiatric: Affect normal.  Nursing note and vitals reviewed.   LABORATORY DATA:. Clinical Support on 09/26/2014  Component Date Value Ref Range Status  . WBC 09/26/2014 2.2* 3.9 - 10.3 10e3/uL Final  . NEUT# 09/26/2014 1.2* 1.5 - 6.5 10e3/uL Final  . HGB 09/26/2014 10.6* 11.6 - 15.9 g/dL Final  . HCT 09/26/2014 32.1* 34.8 - 46.6 % Final  . Platelets 09/26/2014 50* 145 - 400 10e3/uL Final  . MCV 09/26/2014 100.6  79.5 - 101.0 fL Final  . MCH 09/26/2014 33.2  25.1 - 34.0 pg Final  . MCHC 09/26/2014 33.0  31.5 - 36.0 g/dL Final  . RBC 09/26/2014 3.19* 3.70 - 5.45 10e6/uL Final  . RDW  09/26/2014 13.4  11.2 - 14.5 % Final  . lymph# 09/26/2014 0.8* 0.9 - 3.3 10e3/uL Final  . MONO# 09/26/2014 0.2  0.1 - 0.9 10e3/uL Final  . Eosinophils Absolute 09/26/2014 0.0  0.0 - 0.5 10e3/uL Final  . Basophils Absolute 09/26/2014 0.0  0.0 - 0.1 10e3/uL Final  . NEUT% 09/26/2014 54.5  38.4 - 76.8 % Final  . LYMPH% 09/26/2014 35.1  14.0 - 49.7 % Final  . MONO% 09/26/2014 8.6  0.0 - 14.0 % Final  . EOS% 09/26/2014 0.9  0.0 - 7.0 % Final  . BASO% 09/26/2014 0.9  0.0 - 2.0 % Final  . Sodium 09/26/2014 139  136 - 145 mEq/L Final  . Potassium 09/26/2014 4.4  3.5 - 5.1 mEq/L Final  . Chloride 09/26/2014 103  98 -  109 mEq/L Final  . CO2 09/26/2014 26  22 - 29 mEq/L Final  . Glucose 09/26/2014 97  70 - 140 mg/dl Final  . BUN 09/26/2014 20.3  7.0 - 26.0 mg/dL Final  . Creatinine 09/26/2014 1.0  0.6 - 1.1 mg/dL Final  . Total Bilirubin 09/26/2014 <0.20  0.20 - 1.20 mg/dL Final  . Alkaline Phosphatase 09/26/2014 76  40 - 150 U/L Final  . AST 09/26/2014 19  5 - 34 U/L Final  . ALT 09/26/2014 14  0 - 55 U/L Final  . Total Protein 09/26/2014 6.2* 6.4 - 8.3 g/dL Final  . Albumin 09/26/2014 3.3* 3.5 - 5.0 g/dL Final  . Calcium 09/26/2014 8.9  8.4 - 10.4 mg/dL Final  . Anion Gap 09/26/2014 10  3 - 11 mEq/L Final  . EGFR 09/26/2014 51* >90 ml/min/1.73 m2 Final   eGFR is calculated using the CKD-EPI Creatinine Equation (2009)     RADIOGRAPHIC STUDIES: Mr Thoracic Spine W Wo Contrast  09/26/2014   CLINICAL DATA:  Breast cancer. Back pain. Negative for fall or injury.  EXAM: MRI THORACIC AND LUMBAR SPINE WITHOUT AND WITH CONTRAST  TECHNIQUE: Multiplanar and multiecho pulse sequences of the thoracic and lumbar spine were obtained without and with intravenous contrast.  CONTRAST:  30m MULTIHANCE GADOBENATE DIMEGLUMINE 529 MG/ML IV SOLN  COMPARISON:  CT chest 03/16/2014  FINDINGS: MR THORACIC SPINE FINDINGS  Mild compression fracture of T12. Mild enhancement of the inferior vertebral body. This is most  likely a pathologic fracture due to metastatic disease. No epidural tumor or cord compression. No other fracture in the thoracic spine.  Heterogeneous bone marrow signal on the right at C7 and T1 consistent with metastatic disease.  Negative for cord compression. No cord lesion identified. Small pleural effusions bilaterally left greater than right. Large hiatal hernia.  Cholelithiasis.  2.0 cm right liver lesion consistent with metastatic disease as noted on prior CT.  MR LUMBAR SPINE FINDINGS  Mild to moderate compression fracture T12 which appears recent and likely due to metastatic disease. No lumbar fracture.  Multiple bone marrow lesions are seen in the lumbar spine consistent with metastatic disease. These are present that the L2, L3, L4, L5, and S1. These lesions show decreased signal on T1 and enhance postcontrast. No epidural tumor.  L2-3: Disc degeneration and spondylosis with mild spinal stenosis  L3-4:  Disc and facet degeneration with mild spinal stenosis  L4-5: Disc degeneration and spondylosis. Mild facet degeneration and mild spinal stenosis  L5-S1:  Mild degenerative change.  IMPRESSION: Acute or subacute fracture of T12 which is likely pathologic due to metastatic disease. No cord compression.  Lesions on the right involving C7 and T1 vertebral bodies consistent with metastatic disease. Multiple lumbar metastatic deposits also identified without lumbar fracture.  2 cm liver lesion consistent with metastatic disease .  Cholelithiasis.  These results were called by telephone at the time of interpretation on 09/26/2014 at 5:50 pm to [facility] , who verbally acknowledged these results.   Electronically Signed   By: CFranchot GalloM.D.   On: 09/26/2014 17:51   Mr Lumbar Spine W Wo Contrast  09/26/2014   CLINICAL DATA:  Breast cancer. Back pain. Negative for fall or injury.  EXAM: MRI THORACIC AND LUMBAR SPINE WITHOUT AND WITH CONTRAST  TECHNIQUE: Multiplanar and multiecho pulse sequences of  the thoracic and lumbar spine were obtained without and with intravenous contrast.  CONTRAST:  153mMULTIHANCE GADOBENATE DIMEGLUMINE 529 MG/ML IV SOLN  COMPARISON:  CT chest 03/16/2014  FINDINGS: MR THORACIC SPINE FINDINGS  Mild compression fracture of T12. Mild enhancement of the inferior vertebral body. This is most likely a pathologic fracture due to metastatic disease. No epidural tumor or cord compression. No other fracture in the thoracic spine.  Heterogeneous bone marrow signal on the right at C7 and T1 consistent with metastatic disease.  Negative for cord compression. No cord lesion identified. Small pleural effusions bilaterally left greater than right. Large hiatal hernia.  Cholelithiasis.  2.0 cm right liver lesion consistent with metastatic disease as noted on prior CT.  MR LUMBAR SPINE FINDINGS  Mild to moderate compression fracture T12 which appears recent and likely due to metastatic disease. No lumbar fracture.  Multiple bone marrow lesions are seen in the lumbar spine consistent with metastatic disease. These are present that the L2, L3, L4, L5, and S1. These lesions show decreased signal on T1 and enhance postcontrast. No epidural tumor.  L2-3: Disc degeneration and spondylosis with mild spinal stenosis  L3-4:  Disc and facet degeneration with mild spinal stenosis  L4-5: Disc degeneration and spondylosis. Mild facet degeneration and mild spinal stenosis  L5-S1:  Mild degenerative change.  IMPRESSION: Acute or subacute fracture of T12 which is likely pathologic due to metastatic disease. No cord compression.  Lesions on the right involving C7 and T1 vertebral bodies consistent with metastatic disease. Multiple lumbar metastatic deposits also identified without lumbar fracture.  2 cm liver lesion consistent with metastatic disease .  Cholelithiasis.  These results were called by telephone at the time of interpretation on 09/26/2014 at 5:50 pm to [facility] NP , who verbally acknowledged these  results.   Electronically Signed   By: Franchot Gallo M.D.   On: 09/26/2014 17:51    ASSESSMENT/PLAN:    Back pain    Breast wound Patient is status post left breast biopsy and lumpectomy.  Patient states that her previous biopsy surgical site ruptured; and she has an open wound to the left breast at approximately the 2 to 3:00 region.  Patient also has a healing lesion at approximately the 11:00 area as well.  Patient has a 1 cm in diameter lesion that is crusting and scabbed.  Entire left breast and chest wall area with erythema status post radiation therapy.  Patient states that the open breast wound does continue to drain slightly; but continues to improve.  Advised patient would obtain a wound care center referral for further evaluation and management/treatment of open breast wound.  Malignant neoplasm of central portion of female breast Patient completed radiation therapy on 09/04/2014.  She continues to take letrozole on a daily basis.  She initiated the Ibrance oral therapy within the past 2 weeks; but has been holding this oral medication for the past 3 days due to occasional nausea and new onset severe back pain.  Patient has plans to return for labs and a follow-up visit on 10/12/2014.   Pancytopenia due to antineoplastic chemotherapy Chemotherapy-induced pancytopenia noted today with an ANC of 1.2 at the count of 50.  Hemoglobin remained stable at 10.6.  Briefly reviewed all neutropenia guidelines with both patient and her daughter again today.  Patient denies any worsening issues with either easy bleeding or bruising at present.  Compression fracture Patient complaining of mid to lower back pain acute onset approximately 1-2 weeks ago.  Denies any known injury or trauma to the back.  Denies any neurological changes whatsoever.  Has been taking Motrin 800 mg with only minimal  effectiveness.  Patient had palpable tenderness to both the thoracic and lumbar regions on exam.  Stat MRI  of the thoracic and lumbar regions obtained this afternoon revealed multiple bone metastasis to the C7, T1 areas; as well as multiple areas of the lumbar spine.  He was also noted that patient has a T12 compression fracture.  There was no cord compression.  Called and spoke to patient's daughter [name]; and reviewed all MRI results with her.  Patient was given hydrocodone prescription for pain control earlier today.  Advised patient we would call her back first thing in the morning for review of plan and upcoming appointments.  Also advised patient's daughter to have mother go directed to the emergency department overnight if she develops any worsening symptoms whatsoever.     Patient stated understanding of all instructions; and was in agreement with this plan of care. The patient knows to call the clinic with any problems, questions or concerns.   Review/collaboration with Dr. Burr Medico (on call MD- Dr. Lindi Adie) regarding all aspects of patient's visit today.   Total time spent with patient was  40 minutes;  with greater than 75  percent of that time spent in face to face counseling regardher symptoms, review of the MRI results,coordination of care and follow up.  Disclaimer: This note was dictated with voice recognition software. Similar sounding words can inadvertently be transcribed and may not be corrected upon review.   Drue Second, NP 09/26/2014

## 2014-09-26 NOTE — Assessment & Plan Note (Signed)
Patient complaining of mid to lower back pain acute onset approximately 1-2 weeks ago.  Denies any known injury or trauma to the back.  Denies any neurological changes whatsoever.  Has been taking Motrin 800 mg with only minimal effectiveness.  Patient had palpable tenderness to both the thoracic and lumbar regions on exam.  Stat MRI of the thoracic and lumbar regions obtained this afternoon revealed multiple bone metastasis to the C7, T1 areas; as well as multiple areas of the lumbar spine.  He was also noted that patient has a T12 compression fracture.  There was no cord compression.  Called and spoke to patient's daughter [name]; and reviewed all MRI results with her.  Patient was given hydrocodone prescription for pain control earlier today.  Advised patient we would call her back first thing in the morning for review of plan and upcoming appointments.  Also advised patient's daughter to have mother go directed to the emergency department overnight if she develops any worsening symptoms whatsoever.

## 2014-09-26 NOTE — Assessment & Plan Note (Signed)
Chemotherapy-induced pancytopenia noted today with an ANC of 1.2 at the count of 50.  Hemoglobin remained stable at 10.6.  Briefly reviewed all neutropenia guidelines with both patient and her daughter again today.  Patient denies any worsening issues with either easy bleeding or bruising at present.

## 2014-09-26 NOTE — Telephone Encounter (Signed)
Daughter [name] called and left message stating that pt has severe lower back pain bilaterally for about 1 week now.   Spoke with [name], and was informed that pt completed radiation about 2 weeks ago.  Pt has had severe lower back pain bilaterally for about a week.  Pt had taken Ibuprofen 800mg  without relief.  Pt also vomited once.  Pt had stopped taking Ibrance since last Fri 09/21/14 -  3 days before her 14 days cycle completion - to see if the pain would go away.  However, pt still has severe pain.   [name] stated pt was able to eat and drink fine now.  [name] did not think pt is dehydrated.  Instructed [name] to bring pt in today at 1230pm to see Jenny Reichmann, NP symptom management.  [name] voiced understanding.  Onc Tx sent to scheduler. [name]'s  Cell  Phone    [phone].

## 2014-09-26 NOTE — Assessment & Plan Note (Signed)
Patient is status post left breast biopsy and lumpectomy.  Patient states that her previous biopsy surgical site ruptured; and she has an open wound to the left breast at approximately the 2 to 3:00 region.  Patient also has a healing lesion at approximately the 11:00 area as well.  Patient has a 1 cm in diameter lesion that is crusting and scabbed.  Entire left breast and chest wall area with erythema status post radiation therapy.  Patient states that the open breast wound does continue to drain slightly; but continues to improve.  Advised patient would obtain a wound care center referral for further evaluation and management/treatment of open breast wound.

## 2014-09-26 NOTE — Assessment & Plan Note (Signed)
Patient completed radiation therapy on 09/04/2014.  She continues to take letrozole on a daily basis.  She initiated the Ibrance oral therapy within the past 2 weeks; but has been holding this oral medication for the past 3 days due to occasional nausea and new onset severe back pain.  Patient has plans to return for labs and a follow-up visit on 10/12/2014.

## 2014-09-27 ENCOUNTER — Telehealth: Payer: Self-pay | Admitting: Hematology

## 2014-09-27 ENCOUNTER — Telehealth: Payer: Self-pay | Admitting: Nurse Practitioner

## 2014-09-27 NOTE — Telephone Encounter (Signed)
Called to check on patient; but no answer.  Left voicemail on both home and cell phones.

## 2014-09-27 NOTE — Telephone Encounter (Signed)
Labs added wkly per 12/23 POF, mailed sch to pt.... KJ

## 2014-10-02 ENCOUNTER — Encounter: Payer: Self-pay | Admitting: *Deleted

## 2014-10-03 ENCOUNTER — Telehealth: Payer: Self-pay | Admitting: Hematology

## 2014-10-03 ENCOUNTER — Telehealth: Payer: Self-pay | Admitting: *Deleted

## 2014-10-03 ENCOUNTER — Other Ambulatory Visit: Payer: Self-pay | Admitting: Hematology

## 2014-10-03 MED ORDER — TRAMADOL HCL 50 MG PO TABS: 25.0000 mg | ORAL_TABLET | Freq: Four times a day (QID) | ORAL | Status: DC | PRN

## 2014-10-03 NOTE — Telephone Encounter (Signed)
Spoke with pt Daught [name] and she does not want to keep the lab appt for 10/04/14 due to having so much  back pain. pt will keep the 10/12/14 appt with lab and YF. Transfer pt to [place] nurse vm to speak with her about pain and meds.

## 2014-10-03 NOTE — Telephone Encounter (Signed)
Received call from daughter, [name] asking [place] to check with pharmacy for ibrance script stating that she had trouble contacting [facility] pharm.  Called pharm & script ready.  Message left for daughter to p/u script.  She also asked about refilling vicodin or is there something that would not make her dizzy that might work better.  She rates her pain as # 8-9.  Note to Dr Burr Medico.

## 2014-10-03 NOTE — Telephone Encounter (Signed)
Dr. Burr Medico ordered tramadol for pt.  Script sent to [place] daughter notified.

## 2014-10-04 ENCOUNTER — Telehealth: Payer: Self-pay | Admitting: *Deleted

## 2014-10-04 ENCOUNTER — Other Ambulatory Visit: Payer: Medicare Other

## 2014-10-04 NOTE — Telephone Encounter (Signed)
Called to check on pt to see how she was doing with her pain.  Spoke with daughter, [name] & pt is taking vicodin bu doesn't like how it makes her feel.  She has not started the ultram yet but planned to try tomorrow.  Informed that she could take both if needed & to make sure that she takes with food.  Daughter expressed understanding.

## 2014-10-09 ENCOUNTER — Other Ambulatory Visit: Payer: Self-pay | Admitting: *Deleted

## 2014-10-09 ENCOUNTER — Telehealth: Payer: Self-pay | Admitting: *Deleted

## 2014-10-09 DIAGNOSIS — M5489 Other dorsalgia: Secondary | ICD-10-CM

## 2014-10-09 DIAGNOSIS — C50112 Malignant neoplasm of central portion of left female breast: Secondary | ICD-10-CM

## 2014-10-09 MED ORDER — HYDROCODONE-ACETAMINOPHEN 5-325 MG PO TABS: 1.0000 | ORAL_TABLET | Freq: Four times a day (QID) | ORAL | Status: DC | PRN

## 2014-10-09 NOTE — Telephone Encounter (Signed)
Received message from daughter Oliva Bustard requesting refill of Hydrocodone for pt.  Spoke with [name], and was informed that pt has one tablet of Vicodin left - pt takes it for pain in lower back and spine.  Per [name],  Tramadol does not help pt much at all.  Pt seemed to have relief with Vicodin even though pt did not like drowsiness effect with this med.  Message to Dr. Burr Medico for review. [name]'s  Work  Designer, industrial/product     [phone].

## 2014-10-10 ENCOUNTER — Telehealth: Payer: Self-pay | Admitting: *Deleted

## 2014-10-10 ENCOUNTER — Other Ambulatory Visit: Payer: Self-pay | Admitting: *Deleted

## 2014-10-10 MED ORDER — ONDANSETRON HCL 8 MG PO TABS: 8.0000 mg | ORAL_TABLET | Freq: Three times a day (TID) | ORAL | Status: DC | PRN

## 2014-10-10 MED ORDER — LORAZEPAM 0.5 MG PO TABS: ORAL_TABLET | ORAL | Status: DC

## 2014-10-10 NOTE — Telephone Encounter (Addendum)
Received vm call from [name]/Hospice[facility] stating they received a call from daughter for Hospice services for mother.  [name] would like to know if she is hospice appropriate-6 mo or less prognosis & if so would Dr Burr Medico be attending & would she like symptom management by Hospice staff MD.  Note to Dr Burr Medico.  Informed Hospice that we would like to see pt 10/12/13 to discuss 1st.   Pt's daughter called & reports that pt is not doing well.  She states pt's BP is up & pt is nauseated.  She thought that Hospice could help her b/c she has been told they are good at managing nausea & pain. She has had her mother on clear liquids/Propel water to see if that would help her nausea.  She has also given her pepto-bismol which stayed down but nausea came right back.  She has had severe back pain for 2 wks & nausea last 3-4 days.  She reports that her BP is up 214/114 & came down to 188/88 but has climbed back up to 194/98.  She is taking avapro for her BP.  She is taking ibrance & femara but states that she vomits everything.  [name] /[facility] is her PCP.  Daughter is not sure pt can make it in Friday with everything going on right now.  She is taking tramadol & vicodin for pain.  Talked with daughter again & pt had a good BM last HS & doesn't feel constipated.  We were concerned for blockage but hopefully this is not the case.  We will call in [name] if zofran doesn't work & have given instructions to daughter & asked her to call us back tomorrow with update.  We will hold off on Hospice for now.

## 2014-10-10 NOTE — Addendum Note (Signed)
Addended by: Jesse Fall on: 10/10/2014 03:34 PM   Modules accepted: Orders

## 2014-10-11 ENCOUNTER — Telehealth: Payer: Self-pay | Admitting: *Deleted

## 2014-10-11 NOTE — Telephone Encounter (Signed)
Received call from pt's daughter stating that pt had a good night.  She reports that the compazine didn't help pt's nausea much so she took the ativan & that helped & she slept well & ate some this am & st up in the chair.  They believe they can make appt tomorrow based on this report.  Message to Dr Burr Medico.

## 2014-10-12 ENCOUNTER — Ambulatory Visit (HOSPITAL_BASED_OUTPATIENT_CLINIC_OR_DEPARTMENT_OTHER): Admitting: Hematology

## 2014-10-12 ENCOUNTER — Ambulatory Visit: Payer: Medicare Other | Attending: Radiation Oncology | Admitting: Radiation Oncology

## 2014-10-12 ENCOUNTER — Telehealth: Payer: Self-pay | Admitting: *Deleted

## 2014-10-12 ENCOUNTER — Other Ambulatory Visit (HOSPITAL_BASED_OUTPATIENT_CLINIC_OR_DEPARTMENT_OTHER): Payer: Medicare Other

## 2014-10-12 VITALS — BP 135/80 | HR 86 | Temp 97.6°F | Resp 17 | Ht 62.0 in | Wt 113.0 lb

## 2014-10-12 DIAGNOSIS — C50912 Malignant neoplasm of unspecified site of left female breast: Secondary | ICD-10-CM

## 2014-10-12 DIAGNOSIS — C50412 Malignant neoplasm of upper-outer quadrant of left female breast: Secondary | ICD-10-CM

## 2014-10-12 DIAGNOSIS — C7800 Secondary malignant neoplasm of unspecified lung: Secondary | ICD-10-CM

## 2014-10-12 DIAGNOSIS — I10 Essential (primary) hypertension: Secondary | ICD-10-CM

## 2014-10-12 DIAGNOSIS — C787 Secondary malignant neoplasm of liver and intrahepatic bile duct: Secondary | ICD-10-CM | POA: Diagnosis not present

## 2014-10-12 DIAGNOSIS — C7951 Secondary malignant neoplasm of bone: Secondary | ICD-10-CM | POA: Diagnosis not present

## 2014-10-12 DIAGNOSIS — D509 Iron deficiency anemia, unspecified: Secondary | ICD-10-CM | POA: Diagnosis not present

## 2014-10-12 DIAGNOSIS — M5489 Other dorsalgia: Secondary | ICD-10-CM

## 2014-10-12 DIAGNOSIS — C50112 Malignant neoplasm of central portion of left female breast: Secondary | ICD-10-CM

## 2014-10-12 LAB — CBC WITH DIFFERENTIAL/PLATELET
BASO%: 0.6 % (ref 0.0–2.0)
BASOS ABS: 0 10*3/uL (ref 0.0–0.1)
EOS ABS: 0 10*3/uL (ref 0.0–0.5)
EOS%: 0.1 % (ref 0.0–7.0)
HCT: 36.5 % (ref 34.8–46.6)
HEMOGLOBIN: 12 g/dL (ref 11.6–15.9)
LYMPH#: 0.7 10*3/uL — AB (ref 0.9–3.3)
LYMPH%: 18.1 % (ref 14.0–49.7)
MCH: 33 pg (ref 25.1–34.0)
MCHC: 32.9 g/dL (ref 31.5–36.0)
MCV: 100.3 fL (ref 79.5–101.0)
MONO#: 0.2 10*3/uL (ref 0.1–0.9)
MONO%: 3.7 % (ref 0.0–14.0)
NEUT%: 77.5 % — ABNORMAL HIGH (ref 38.4–76.8)
NEUTROS ABS: 3.2 10*3/uL (ref 1.5–6.5)
Platelets: 223 10*3/uL (ref 145–400)
RBC: 3.64 10*6/uL — AB (ref 3.70–5.45)
RDW: 13.8 % (ref 11.2–14.5)
WBC: 4.1 10*3/uL (ref 3.9–10.3)

## 2014-10-12 LAB — COMPREHENSIVE METABOLIC PANEL (CC13)
ALBUMIN: 3.4 g/dL — AB (ref 3.5–5.0)
ALT: 10 U/L (ref 0–55)
AST: 20 U/L (ref 5–34)
Alkaline Phosphatase: 105 U/L (ref 40–150)
Anion Gap: 7 mEq/L (ref 3–11)
BUN: 17.1 mg/dL (ref 7.0–26.0)
CHLORIDE: 93 meq/L — AB (ref 98–109)
CO2: 30 meq/L — AB (ref 22–29)
CREATININE: 0.8 mg/dL (ref 0.6–1.1)
Calcium: 9.1 mg/dL (ref 8.4–10.4)
EGFR: 62 mL/min/{1.73_m2} — ABNORMAL LOW (ref >90)
Glucose: 162 mg/dl — ABNORMAL HIGH (ref 70–140)
Potassium: 3.9 mEq/L (ref 3.5–5.1)
Sodium: 130 mEq/L — ABNORMAL LOW (ref 136–145)
TOTAL PROTEIN: 6.5 g/dL (ref 6.4–8.3)
Total Bilirubin: 0.3 mg/dL (ref 0.20–1.20)

## 2014-10-12 LAB — IRON AND TIBC CHCC
%SAT: 27 % (ref 21–57)
Iron: 68 ug/dL (ref 41–142)
TIBC: 256 ug/dL (ref 236–444)
UIBC: 188 ug/dL (ref 120–384)

## 2014-10-12 LAB — FERRITIN CHCC: FERRITIN: 320 ng/mL — AB (ref 9–269)

## 2014-10-12 MED ORDER — LORAZEPAM 0.5 MG PO TABS: ORAL_TABLET | ORAL | Status: DC

## 2014-10-12 MED ORDER — DEXAMETHASONE 4 MG PO TABS
4.0000 mg | ORAL_TABLET | Freq: Two times a day (BID) | ORAL | Status: DC
Start: 2014-10-12 — End: 2014-10-19

## 2014-10-12 MED ORDER — HYDROCODONE-ACETAMINOPHEN 5-325 MG PO TABS: 1.0000 | ORAL_TABLET | Freq: Four times a day (QID) | ORAL | Status: DC | PRN

## 2014-10-12 NOTE — Progress Notes (Signed)
Hematology and Oncology Follow Up Visit Date of Visit: 08/03/2014  [name] [id number] 01-23-1922 81 y.o.  CC: [name], MDHolwerda, Barbara Reaper, MD   DIAGNOSIS: metastatic breast cancer    Malignant neoplasm of central portion of female breast   01/02/2009 Initial Diagnosis Malignant neoplasm of left breast, [id number], stage IIIB. ER 100% (+), PR 90%(+), HER2 (-), Ki67 45%. Pt declined surgery , RT and chemo.    02/02/2009 - 12/14/2012 Anti-estrogen oral therapy 02/02/2009-12/14/2012: Femara, with good response, stopped on local progression    12/14/2012 - 02/13/2014 Anti-estrogen oral therapy Faslodex, stopped due to progression    03/05/2014 Treatment Plan Change Femara 2.87m daily and palbociclib (dose reduction due to neutropenia)   03/19/2014 Progression PET scan showed lung and liver mets    08/06/2014 - 09/04/2014 Radiation Therapy palliative RT to left breast    09/26/2014 Progression Pt developed back pain, MRI thoracic and lumbar spine showed multiple bone mets and T12 compression fracture.      OTHER RELATED ISSUES: 1. Cystic masses involving the right breast with negative biopsy on 01/02/2009.  2. Iron-deficiency anemia requiring hospitalization, 4 units of blood and intravenous iron. The patient had positive stools but a negative colonoscopy on 02/28/2009. Feraheme 510 mg IV was given on 11/06/2011, 11/13/2011, 08/12/2012 and 08/19/2012 and 07/20/2014.   Current therapy:   1. Femara 2.5 mg daily plus palbociclib started in June 2015. Palbociclib dose reduced to 75 mg daily for 2 weeks on and one week off due to neutropenia (no infection), and it was from Oct to Dec 2015 due to radiation.   Current disease distribution: Left breast (primary), small pulmonary metastases, small focus in right breast, new 1.5cm liver lesion (03/2014 scan), and bone (MRI spine on 09/26/14)  Chief complain: Follow up breast cancer   Interim History:   Barbara CSaezreturns for follow-up and she is accompanied  today by her daughter, [name]  She developed worsening low back pain in the middle of December. She was seen by our symptom management clinic nurse practitioner Cindee on 09/26/2014. A stat thoracic and lumbar MRI showed multiple bone metastases, and acute or subacute T12 compression fracture, no cord compression. She was started on pain medication Vicodin.  Her daughter called a few days ago that her back pain got much worse, she was hypertensive with SBP 180-200, nauseated, not able to sleep and eat well. I called in Compazine and Ativan, and Ativan helped significantly for her symptoms. She has stopped Femara and Palbo. Her daughter also called hospice and discussed with them over the phone. She comes in today with her daughter to discuss hospice.  Past Medical History  Diagnosis Date  . Anemia, unspecified   . Malignant neoplasm of breast (female), unspecified site   . Anemia, iron deficiency   . Hiatal hernia   . HTN (hypertension)   . Non-sustained ventricular tachycardia   . Cholelithiasis   . Breast cancer, left breast 10/28/2011  . GERD (gastroesophageal reflux disease)   . Pulmonary nodules/lesions, multiple 09/09/12    PET SCAN  . Use of fulvestrant (Faslodex) 12/14/12  . Erythema 12/14/12    Left breast - Underside  . Bleeding 12/14/12     Left Breast - Pt. Report bleeing and Stinging  . History of radiation therapy 08/06/14-09/06/14    left breast 40.05 Gy 15 fx, left breast boost/10Gy 5 fx     Medications: I have reviewed the patient's current medications.  Current Outpatient Prescriptions  Medication Sig Dispense Refill  .  aspirin 81 MG tablet Take 81 mg by mouth daily.     . Cholecalciferol 5000 UNITS TABS Take 5,000 Units by mouth daily.    . Ferrous Sulfate (IRON) 325 (65 FE) MG TABS Take 325 mg by mouth daily.    Marland Kitchen HYDROcodone-acetaminophen (NORCO/VICODIN) 5-325 MG per tablet Take 1-2 tablets by mouth every 6 (six) hours as needed for moderate pain. 30 tablet 0  .  irbesartan (AVAPRO) 150 MG tablet Take 1 tablet by mouth 2 (two) times daily.    Marland Kitchen letrozole (FEMARA) 2.5 MG tablet Take 1 tablet (2.5 mg total) by mouth daily. 90 tablet 2  . LORazepam (ATIVAN) 0.5 MG tablet 1 tab po or sl q 4-6 h prn nausea/vomiting 15 tablet 0  . Multiple Vitamin (MULTIVITAMIN) tablet Take 1 tablet by mouth daily.    . ondansetron (ZOFRAN) 8 MG tablet Take 1 tablet (8 mg total) by mouth every 8 (eight) hours as needed for nausea or vomiting. 30 tablet 0  . palbociclib (IBRANCE) 75 MG capsule Take 1 capsule (75 mg total) by mouth daily with breakfast. Take whole with food, for 14 days then off 7 days 14 capsule 5  . senna (SENOKOT) 8.6 MG tablet Take 1 tablet by mouth as needed.    . traMADol (ULTRAM) 50 MG tablet Take 0.5 tablets (25 mg total) by mouth every 6 (six) hours as needed. 30 tablet 1  . vitamin B-12 (CYANOCOBALAMIN) 100 MCG tablet Take 50 mcg by mouth daily.     No current facility-administered medications for this visit.   Allergies:  Allergies  Allergen Reactions  . Ace Inhibitors Itching  . Amlodipine Nausea Only and Rash    Leg cramps  . Lisinopril Rash    Past Medical History, Surgical history, Social history, and Family History were reviewed and updated.  Review of Systems: Constitutional:  Negative for fever, chills, night sweats, (+) anorexia, weight loss and back pain. Cardiovascular: no chest pain or dyspnea on exertion Respiratory: no cough, shortness of breath, or wheezing Neurological: no TIA or stroke symptoms Dermatological: negative for lumps and skin lesion changes ENT: negative for - epistaxis Skin: Negative. Gastrointestinal: no abdominal pain, change in bowel habits, or black or bloody stools Genito-Urinary: no dysuria, trouble voiding, or hematuria Hematological and Lymphatic: negative for - bleeding problems or bruising Breast: (+) left breat mass with open wound and bloody discharge Musculoskeletal: negative for - gait  disturbance Remaining ROS negative.  Physical Exam: Blood pressure 135/80, pulse 86, temperature 97.6 F (36.4 C), temperature source Oral, resp. rate 17, height 5' 2"  (1.575 m), weight 113 lb (51.256 kg), SpO2 98 %. ECOG: 3 General appearance: alert, cooperative, appears stated age and no distress Head: Normocephalic, without obvious abnormality, atraumatic Neck: no adenopathy, supple, symmetrical, trachea midline and thyroid not enlarged, symmetric, no tenderness/mass/nodules Lymph nodes: Cervical adenopathy: None appreciated and Supraclavicular adenopathy: None appreciated Heart:regular rate and rhythm, S1, S2 normal, no murmur, click, rub or gallop Lung:chest clear, no wheezing, rales, normal symmetric air entry, Heart exam - S1, S2 normal, no murmur, no gallop, rate regular Breast: not exam today  Abdomin: soft, non-tender, without masses or organomegaly EXT:No peripheral edema Neuro: No focal deficits  Lab Results: CBC Latest Ref Rng 10/12/2014 09/26/2014 08/31/2014  WBC 3.9 - 10.3 10e3/uL 4.1 2.2(L) 4.3  Hemoglobin 11.6 - 15.9 g/dL 12.0 10.6(L) 11.1(L)  Hematocrit 34.8 - 46.6 % 36.5 32.1(L) 34.2(L)  Platelets 145 - 400 10e3/uL 223 50(L) 180    CMP Latest Ref  Rng 10/12/2014 09/26/2014 08/31/2014  Glucose 70 - 140 mg/dl 162(H) 97 108  BUN 7.0 - 26.0 mg/dL 17.1 20.3 13.8  Creatinine 0.6 - 1.1 mg/dL 0.8 1.0 0.7  Sodium 136 - 145 mEq/L 130(L) 139 137  Potassium 3.5 - 5.1 mEq/L 3.9 4.4 4.6  Chloride 98 - 107 mEq/L - - -  CO2 22 - 29 mEq/L 30(H) 26 27  Calcium 8.4 - 10.4 mg/dL 9.1 8.9 9.0  Total Protein 6.4 - 8.3 g/dL 6.5 6.2(L) 6.0(L)  Total Bilirubin 0.20 - 1.20 mg/dL 0.30 <0.20 0.22  Alkaline Phos 40 - 150 U/L 105 76 72  AST 5 - 34 U/L 20 19 20   ALT 0 - 55 U/L 10 14 16        Radiological Studies: 1. Chest x-ray on 06/27/2010 showed no acute cardiopulmonary process.  2. Digital diagnostic bilateral mammograms on 07/04/2010 showed the masses in both breasts.  3. Digital  diagnostic bilateral mammograms carried out on 07/31/2011 showed a dense spiculated mass containing central biopsy clip  artifact in the slightly upper outer left breast with associated nipple and skin retraction without significant change since the most recent prior study of 07/04/2010. On mammogram, this mass measures approximately 2.7 x 2.5 x 2.9 cm. No new mass is identified in the left breast. On the right side, the circumscribed more superiorly and posteriorly positioned mass has decreased in size since 07/04/2010. On the mammogram, it currently measures 3.2 x 3.2 x 2.8 cm, whereas previously it measured 4.0 x 3.8 x 3.4 cm. The more irregular mass in the upper central right breast just anterior to the circumscribed mass is without significant change. It measured approximately 2.9 cm in greatest dimension and is felt to be stable. No new masses identified in the right breast.  4. Chest x-ray, 2 view, from 08/05/2012 showed a large hiatal hernia, but no acute cardiopulmonary disease.  5. Digital diagnostic bilateral mammogram and bilateral breast ultrasound were carried out on 08/12/2012. These studies were compared with the study done from 07/31/2011. There was suggestion of progression. A mass in the left breast that measured 3.3 cm on 11/08 had previously measured 2.6 cm by mammogram. There was another mass seen in the lower central aspect of the left breast measuring 1.1 cm whereas previously on 07/31/2011 it measured 0.6 cm. Ultrasound was carried out on 08/12/2012. The dominant mass in the left breast measured 2.5 x 2.4 x 3.0 cm by ultrasound. The smaller lesion by ultrasound measured 1.1 x 0.7 x 0.9 cm. I do not believe we had an ultrasound last year for comparison. A lymph node was seen in the left axilla measuring 1.1 x 0.9 x 1.4 cm. There was obliteration of the fatty hilum. There were also by ultrasound masses in the right breast. A mass in the superior aspect of the right breast measured 2.4 x 2.6  x 0.6 cm. This mass was located 3 cm from the nipple. A second mass which was located 1 cm from the nipple measured 1.0 x 0.8 x 1.1 cm. It was felt that the known biopsy proven malignancy in the left breast was progressing. The masses in the right breast were decreasing.  6. Mammogram and ultrasound of the left breast from 08/12/2012 suggested progression compared with the prior mammogram of 07/31/2011. The mass which is seen in about the 3 o'clock position now measures about 3.3 cm as compared with 2.6 cm previously. There was another mass seen at around the 6 o'clock position that measures 1.1  cm, whereas previously it measured 0.6 cm. There was also felt to be a lymph node in the left axilla measuring about 1.4 cm in maximum dimension. There was some obliteration of the fatty hilum suggesting that this may, in fact, be replaced by cancer. Ultrasound exam of the right breast showed a mass in the superior aspect measuring 2.4 x 2.6 x 0.6 cm. The mass is located about 3 cm from the nipple. There was a second mass located about 1 cm from the nipple and this measured about 1.1 cm. The masses  in the right breast have decreased compared with prior studies, whereas the malignancy in the left breast seems to be progressing.  7. PET scan carried out on 09/09/2012 was compared with the prior PET scan from 01/04/2009. Once again there are 2 lesions seen in the left breast. The larger lesion is located at about the 3 o'clock position. There is a smaller lesion at about 6 o'clock which may be new, as well as a left axillary lymph node. There was previously a dumbbell-shaped lesion in the right breast which was quite large. There is now a small residual focus corresponding to the upper part of the previous dumbbell lesion with some increased activity which raises concern that this residual lesion may be malignant. In reviewing the original PET scan from 01/04/2009, it appears that the patient may have had some small  pulmonary metastases which had regressed on treatment with Femara. Some of the lesions are still present on the most recent PET scan from 09/09/2012, but clearly are smaller.  8. Digital diagnostic mammogram of the left breast on 12/02/2012 shows progression compared with the prior study from 08/12/2012. The larger lesion at about 3 o'clock in the left breast now is felt to measure 3.8 cm in maximum dimension. The second lesion at about 6 o'clock measures 1.1 cm in maximum dimension.  9. Chest x-ray, 2 view, from 12/02/2012 showed some emphysematous changes, but no acute cardiopulmonary findings. There was a large hiatal hernia. 10. CT Chest w contrast on 03/16/2014 (personally reviewed by me).  Progressive enlargement of dominant left breast mass with multiple new breasts masses consistent with metastases.  Deep mass in the right breast has not significantly changed. Multiple new pulmonary nodules consistent with metastatic disease.  New hepatic lesions suspicious for metastatic disease  MR thoracic and lumbar spine 09/26/2014 IMPRESSION: Acute or subacute fracture of T12 which is likely pathologic due to metastatic disease. No cord compression.  Lesions on the right involving C7 and T1 vertebral bodies consistent with metastatic disease. Multiple lumbar metastatic deposits also identified without lumbar fracture.  2 cm liver lesion consistent with metastatic disease .  Cholelithiasis.  Impression and Plan:  1. Left breast cancer with mets to liver and lungs and bones, Stage IV.  -Due to her rapid disease progression, resistance to treatment, her advanced age, I think hospice is appropriate. I discussed palliative radiation spinal bone metastases, patient declined at this point. Patient and her daughter has opted for hospice care. I'll make a formal referral today. -Stop Femara and Palbo.   2. Cancer-related symptom management -I give her refills of Ativan and Vicodin today for her pain,  anxiety and insomnia. -I recommend her to try dexamethasone for anorexia and nausea.  -I'll work with hospice team for her symptom management  2. Anemia, history of iron deficient anemia -No need for blood transfusion now.  3. Hypertension -much improved. She will continue medication.   Plan: -hospice referral  -  refill ativan and vicodin -add dexamethasone 59m daily for 1 week for pain, nausea and anorexia   I spent 25 minutes counseling the patient face to face. The total time spent in the appointment was 30 minutes.  FTruitt MerleMD  10/12/2014

## 2014-10-12 NOTE — Telephone Encounter (Signed)
Called [facility] & gave approval for Hospice.  Dr. Burr Medico will be attending & OK for symptom management.

## 2014-10-13 ENCOUNTER — Encounter: Payer: Self-pay | Admitting: Hematology

## 2014-10-17 ENCOUNTER — Telehealth: Payer: Self-pay | Admitting: *Deleted

## 2014-10-17 ENCOUNTER — Other Ambulatory Visit: Payer: Self-pay | Admitting: *Deleted

## 2014-10-17 DIAGNOSIS — C50119 Malignant neoplasm of central portion of unspecified female breast: Secondary | ICD-10-CM

## 2014-10-17 MED ORDER — PROCHLORPERAZINE MALEATE 10 MG PO TABS: 10.0000 mg | ORAL_TABLET | Freq: Three times a day (TID) | ORAL | Status: AC | PRN

## 2014-10-17 NOTE — Telephone Encounter (Signed)
Received call from [name], RN @ [facility] re:  Pt had requested DNR, and hospice md had signed the form.  Pt now has DNR in place.  However, pt has been taking Vicodin for pain and Lorazepam for nausea.  Pt has relief from pain and nausea with these meds, but pt just feels too drowsy.   Pt informed [name] that Zofran did not help with nausea, and Tramadol did not help with pain. Pt also has reflux.   [name] wanted to know if there is anything else that pt could take for pain and nausea that would not make pt too drowsy. [facility]  Phone     [phone].

## 2014-10-17 NOTE — Telephone Encounter (Signed)
Spoke with [name], RN @ [facility] and informed her re:  Per Dr. Burr Medico, try Dexamethasone for now to see if nausea is controlled and also to help with appetite.  Compazine 10mg  called in to pt's pharmacy.  Instructed [name] to make sure pt does not take Compazine and Ativan together.   If nausea is not controlled by Decadron, pt can take Compazine.  Pt needs to stop taking Ativan for now.  [name] understood that Decadron can be refilled for pt if it helps with nausea/vomiting.

## 2014-10-19 ENCOUNTER — Other Ambulatory Visit: Payer: Self-pay | Admitting: *Deleted

## 2014-10-19 ENCOUNTER — Other Ambulatory Visit: Payer: Medicare Other

## 2014-10-19 MED ORDER — OMEPRAZOLE 20 MG PO CPDR: 20.0000 mg | DELAYED_RELEASE_CAPSULE | Freq: Every day | ORAL | Status: DC

## 2014-10-19 MED ORDER — DEXAMETHASONE 4 MG PO TABS: 4.0000 mg | ORAL_TABLET | Freq: Two times a day (BID) | ORAL | Status: DC

## 2014-10-23 ENCOUNTER — Telehealth: Payer: Self-pay | Admitting: *Deleted

## 2014-10-23 DIAGNOSIS — C50112 Malignant neoplasm of central portion of left female breast: Secondary | ICD-10-CM

## 2014-10-23 DIAGNOSIS — M5489 Other dorsalgia: Secondary | ICD-10-CM

## 2014-10-23 MED ORDER — HYDROCODONE-ACETAMINOPHEN 5-325 MG PO TABS: 1.0000 | ORAL_TABLET | Freq: Four times a day (QID) | ORAL | Status: DC | PRN

## 2014-10-23 MED ORDER — DEXAMETHASONE 4 MG PO TABS: 4.0000 mg | ORAL_TABLET | Freq: Two times a day (BID) | ORAL | Status: DC

## 2014-10-23 NOTE — Telephone Encounter (Signed)
Received call from [facility] RN/[facility] stating pt is loving the decadron & 1 wk script sent in for 4 mg bid with meals on 10/19/14 & is worried due to possible bad weather if she can get a 2 wk supply or more.  She also request a refill on her vicodin 5/325.  She states the combination of the decadron & vicodin is working for her.  Note to Dr Burr Medico.

## 2014-10-24 ENCOUNTER — Telehealth: Payer: Self-pay | Admitting: *Deleted

## 2014-10-24 NOTE — Telephone Encounter (Signed)
Called [name] this am & to let her know that scripts were sent to [facility] for this pt last eve.

## 2014-10-26 ENCOUNTER — Other Ambulatory Visit: Payer: Medicare Other

## 2014-10-29 ENCOUNTER — Encounter: Payer: Self-pay | Admitting: Hematology

## 2014-10-29 NOTE — Progress Notes (Signed)
Faxed daughter's fmla form to [facility]

## 2014-11-02 ENCOUNTER — Other Ambulatory Visit: Payer: Medicare Other

## 2014-11-08 ENCOUNTER — Other Ambulatory Visit: Payer: Self-pay | Admitting: *Deleted

## 2014-11-08 ENCOUNTER — Telehealth: Payer: Self-pay | Admitting: *Deleted

## 2014-11-08 DIAGNOSIS — C50119 Malignant neoplasm of central portion of unspecified female breast: Secondary | ICD-10-CM

## 2014-11-08 MED ORDER — LORAZEPAM 0.5 MG PO TABS: ORAL_TABLET | ORAL | Status: DC

## 2014-11-08 NOTE — Telephone Encounter (Signed)
Received call from [name], RN @ [facility] re:  Pt needs refill of Lorazepam 0.5mg  PRN for nausea/vomiting.  Pt uses [facility] on [address]. [facility]  Phone     [phone].

## 2014-11-12 ENCOUNTER — Telehealth: Payer: Self-pay | Admitting: *Deleted

## 2014-11-12 DIAGNOSIS — M5489 Other dorsalgia: Secondary | ICD-10-CM

## 2014-11-12 DIAGNOSIS — C50112 Malignant neoplasm of central portion of left female breast: Secondary | ICD-10-CM

## 2014-11-12 MED ORDER — HYDROCODONE-ACETAMINOPHEN 5-325 MG PO TABS: 1.0000 | ORAL_TABLET | Freq: Four times a day (QID) | ORAL | Status: DC | PRN

## 2014-11-12 NOTE — Telephone Encounter (Signed)
Received vm call this am from daughter, [name] asking for refill on pt's vicodin.  She states pt takes 2 q 5-6 hours & will take last two today.  Med will be refilled.

## 2014-11-20 ENCOUNTER — Telehealth: Payer: Self-pay | Admitting: *Deleted

## 2014-11-20 DIAGNOSIS — C50112 Malignant neoplasm of central portion of left female breast: Secondary | ICD-10-CM

## 2014-11-20 DIAGNOSIS — M5489 Other dorsalgia: Secondary | ICD-10-CM

## 2014-11-20 MED ORDER — HYDROCODONE-ACETAMINOPHEN 5-325 MG PO TABS: 1.0000 | ORAL_TABLET | Freq: Four times a day (QID) | ORAL | Status: AC | PRN

## 2014-11-20 NOTE — Telephone Encounter (Signed)
Received call from Teresa/RN/[facility] for refill on vicodin & would like at least a 2 wk supply stating pt is taking 2 q 6 h.  Verified with Dr Burr Medico & OK to refill. Script faxed to [facility] @ [phone].

## 2014-11-27 ENCOUNTER — Encounter: Payer: Self-pay | Admitting: *Deleted

## 2014-11-27 ENCOUNTER — Other Ambulatory Visit: Payer: Self-pay | Admitting: *Deleted

## 2014-11-27 DIAGNOSIS — C50119 Malignant neoplasm of central portion of unspecified female breast: Secondary | ICD-10-CM

## 2014-11-27 MED ORDER — DEXAMETHASONE 4 MG PO TABS: 4.0000 mg | ORAL_TABLET | Freq: Two times a day (BID) | ORAL | Status: DC

## 2014-11-27 MED ORDER — LORAZEPAM 0.5 MG PO TABS: ORAL_TABLET | ORAL | Status: DC

## 2014-11-27 NOTE — Progress Notes (Signed)
[name] with [facility] called requesting refills for lorazepam and decadron for Ms. Seki.  Those were called to [facility] in [place] ([place]), both for a two week supply. Informed Hospice Nurse of same.

## 2014-12-06 ENCOUNTER — Other Ambulatory Visit: Payer: Self-pay | Admitting: *Deleted

## 2014-12-06 DIAGNOSIS — C50119 Malignant neoplasm of central portion of unspecified female breast: Secondary | ICD-10-CM

## 2014-12-06 MED ORDER — OXYCODONE HCL 5 MG PO TABA: ORAL_TABLET | ORAL | Status: AC

## 2014-12-06 NOTE — Telephone Encounter (Signed)
Call received inquiring about patient's pain and use of Norco 5-325 mg.  Taking 2 tablets every four hours.  Dr. Burr Medico notified.  Verbal order received and rad back for Oxycodone 5 mg tablets.  Will fax script pharmacy

## 2014-12-07 ENCOUNTER — Telehealth: Payer: Self-pay | Admitting: *Deleted

## 2014-12-07 NOTE — Telephone Encounter (Signed)
Hospice called in refill for patient's omeprazole, but they have to call in the OTC.  Just FYI

## 2014-12-18 ENCOUNTER — Telehealth: Payer: Self-pay | Admitting: *Deleted

## 2014-12-18 ENCOUNTER — Other Ambulatory Visit: Payer: Self-pay | Admitting: Hematology

## 2014-12-18 DIAGNOSIS — C50119 Malignant neoplasm of central portion of unspecified female breast: Secondary | ICD-10-CM

## 2014-12-18 NOTE — Telephone Encounter (Signed)
Called & spoke wit [facility] & informed that Dr Burr Medico is fine with Hospice MD to manage pt's meds for long acting pain med & also OK to rf dexamethasone & lorazepam.  She will check with MD & call back if this is a problem.

## 2014-12-18 NOTE — Telephone Encounter (Signed)
[name] with [facility] called about [name].  She said that she was started on oxycodone 5 mg 1-2 tabs q 4-6 hours as needed beginning 12/09/14.  She is taking the oxycodone every 4 hours and it is controlling her pain, however the patient is requesting a long acting pain reliever so that she does not have to take it every 4 hours.  [name] said that the Hospice physicians would be glad to manage her medications, if Dr. Burr Medico would like, but that has not been arranged thus far. The patient also needs refills for dexamethasone and lorazepam.  The last ones prescribed were for a 2 week supply only.  Please call [name] at [phone], if Dr. Burr Medico would like Hospice to manage meds.  Otherwise, the patient is requesting Rx go to [address] in [place].

## 2014-12-19 ENCOUNTER — Other Ambulatory Visit: Payer: Self-pay | Admitting: *Deleted

## 2015-01-10 ENCOUNTER — Other Ambulatory Visit: Payer: Self-pay | Admitting: Hematology

## 2015-01-10 DIAGNOSIS — C50112 Malignant neoplasm of central portion of left female breast: Secondary | ICD-10-CM

## 2015-01-10 MED ORDER — OMEPRAZOLE 20 MG PO CPDR: 20.0000 mg | DELAYED_RELEASE_CAPSULE | Freq: Every day | ORAL | Status: AC

## 2015-01-17 ENCOUNTER — Other Ambulatory Visit: Payer: Self-pay | Admitting: Hematology

## 2015-01-17 DIAGNOSIS — C50919 Malignant neoplasm of unspecified site of unspecified female breast: Secondary | ICD-10-CM

## 2015-03-15 ENCOUNTER — Telehealth: Payer: Self-pay | Admitting: *Deleted

## 2015-03-18 ENCOUNTER — Telehealth: Payer: Self-pay | Admitting: Hematology

## 2015-03-18 NOTE — Telephone Encounter (Signed)
Received death certificate 6/85/99

## 2015-04-05 NOTE — Telephone Encounter (Signed)
VM message from [place] @ [facility]/ to report that pt died at home 03/31/15 @ 7:27 am

## 2015-04-05 NOTE — Telephone Encounter (Signed)
Notified HIM

## 2015-04-05 DEATH — deceased

## 2015-12-20 ENCOUNTER — Other Ambulatory Visit: Payer: Self-pay | Admitting: Nurse Practitioner

## 2016-03-06 IMAGING — CT CT CHEST W/ CM
2 of 3 series · 15 of 36 positions shown, 18 images · IV contrast (OMNIPAQUE)
Comparison: PET-CT 09/19/2012.

CLINICAL DATA: Restaging left breast cancer diagnosed in 0959.
Femara therapy in progress.

EXAM:
CT CHEST WITH CONTRAST
TECHNIQUE: Multidetector CT imaging of the chest was performed during
intravenous contrast administration.
CONTRAST:  80mL OMNIPAQUE IOHEXOL 300 MG/ML  SOLN

[Series 2: chest with st · axial · 0.62mm/px · z∈[-280,-65]mm · 12 of 51 slices shown, 15 images]
[im 4/51  mediastinal]
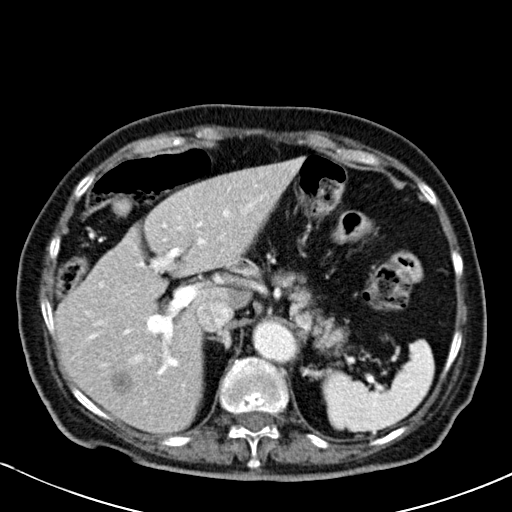
[im 4/51  lung]
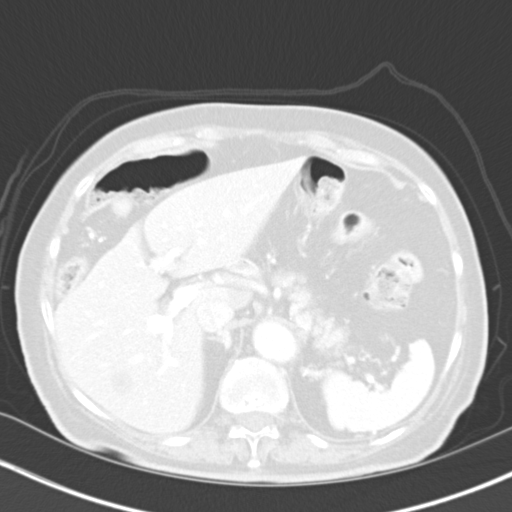
[im 8/51  lung]
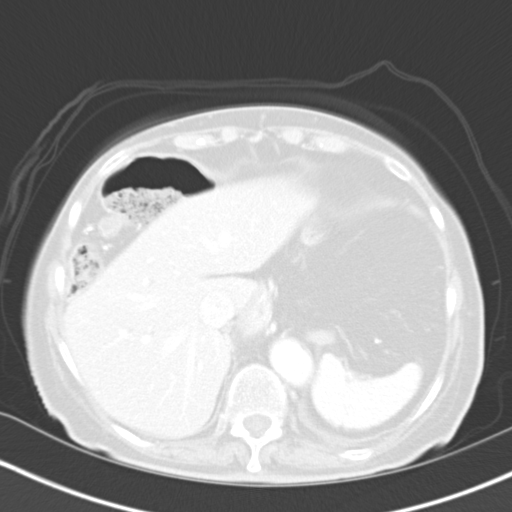
[im 12/51  lung]
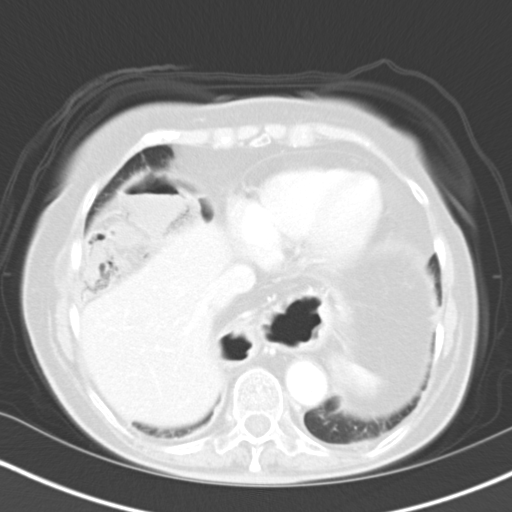
[im 15/51  lung]
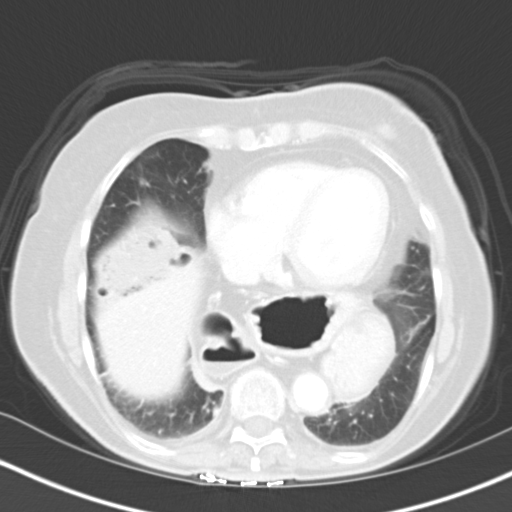
[im 19/51  mediastinal]
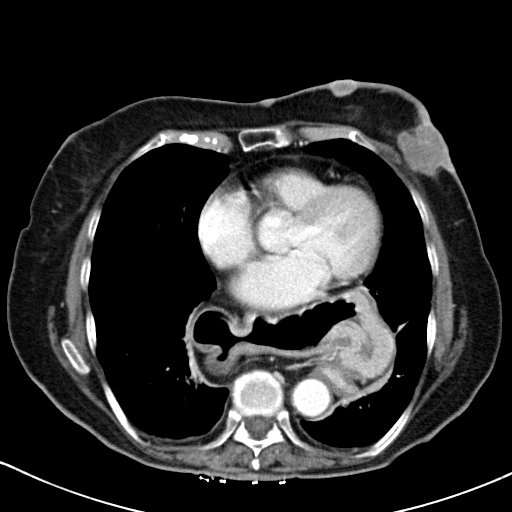
[im 19/51  lung]
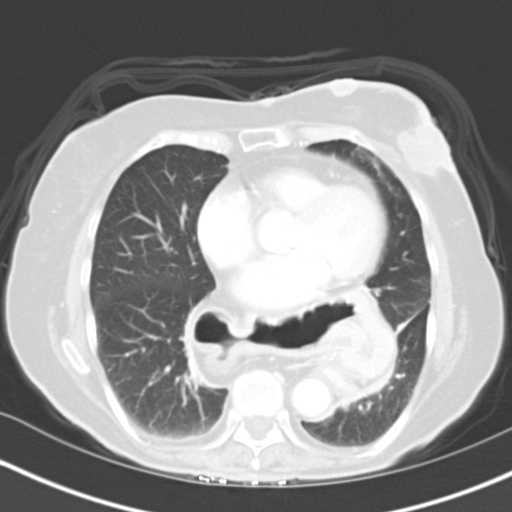
[im 23/51  lung]
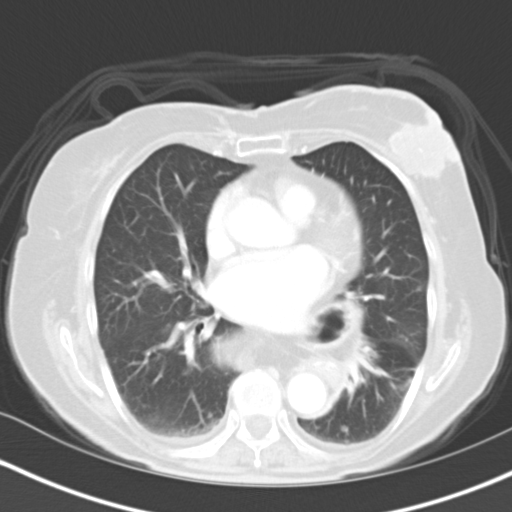
[im 28/51  lung]
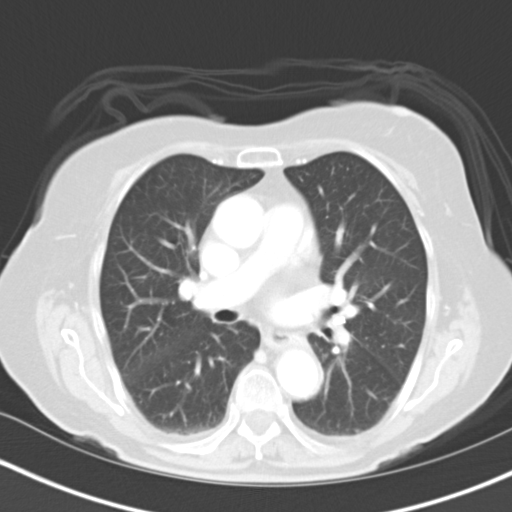
[im 32/51  lung]
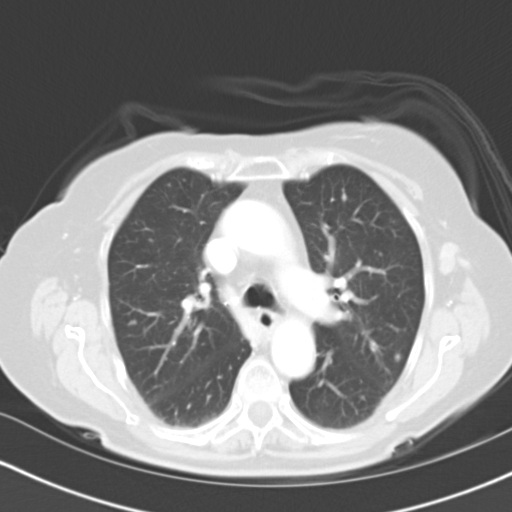
[im 36/51  mediastinal]
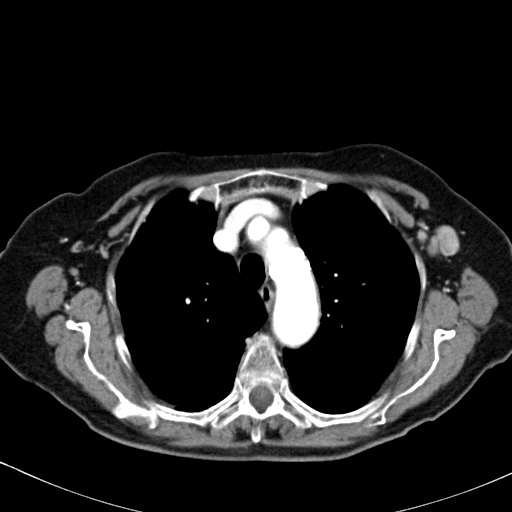
[im 36/51  lung]
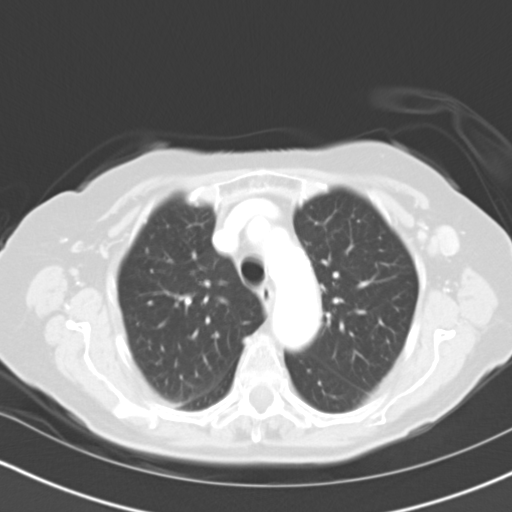
[im 39/51  lung]
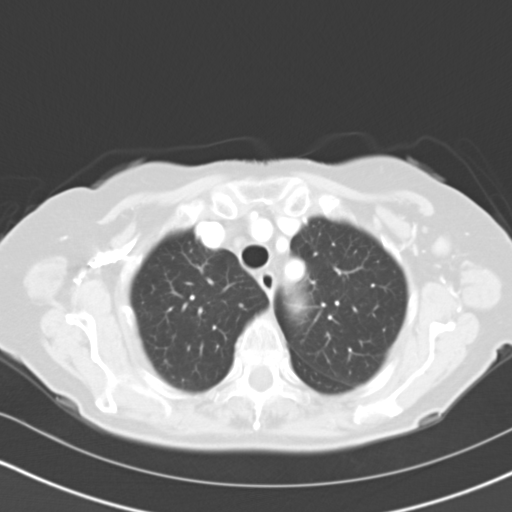
[im 43/51  lung]
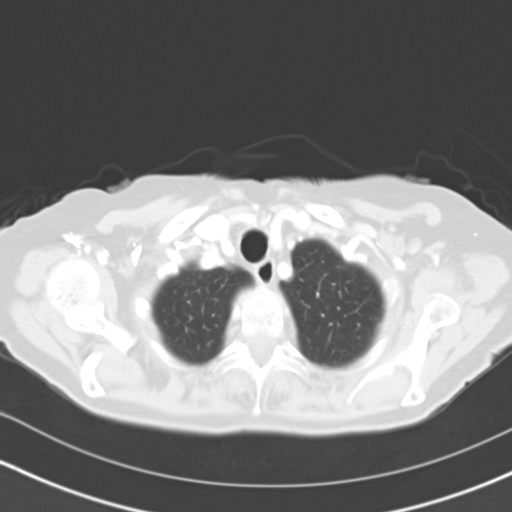
[im 47/51  lung]
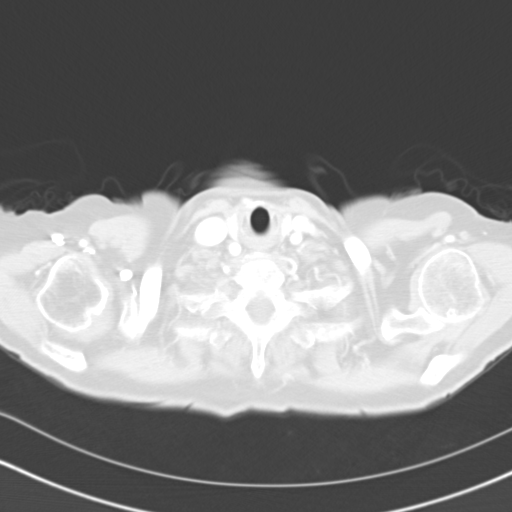

[Series 602: <mpr thick range> · coronal · 0.62mm/px · 3 of 78 slices shown]
[im 16/78  lung]
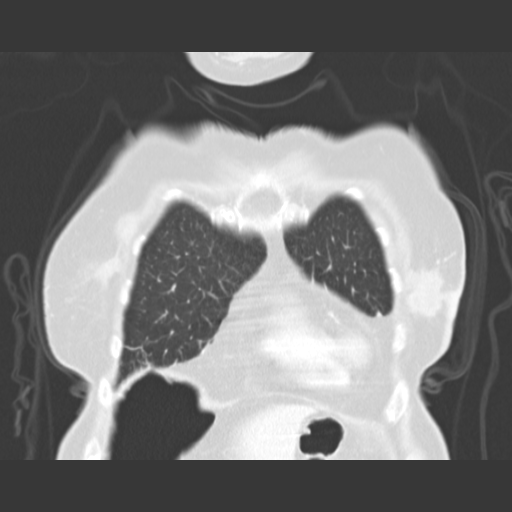
[im 31/78  lung]
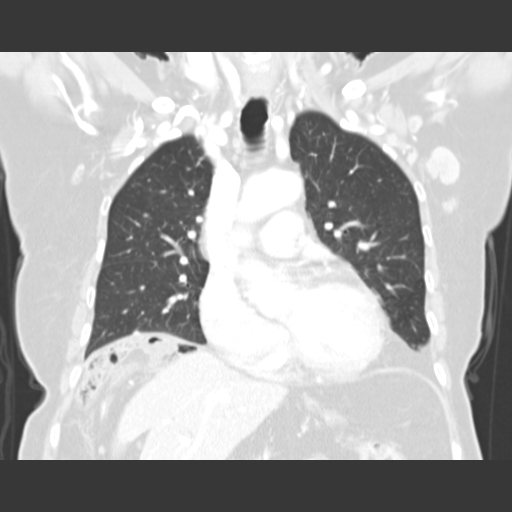
[im 47/78  lung]
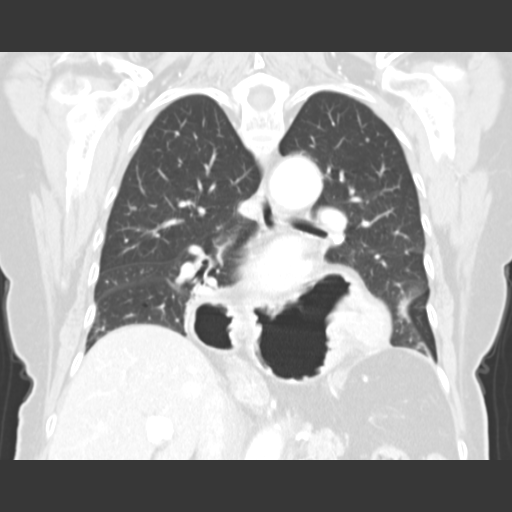

[15 of 36 positions shown; findings below may reference images not displayed]

FINDINGS: There has been progressive enlargement of the irregular subareolar
mass within the left breast, now measuring up to 4.2 x 3.4 cm on
image 31. There are multiple new masses within the left breast
separate from the dominant mass. In addition, there are new enlarged
left axillary lymph nodes measuring up to 2.2 x 1.9 cm on image 15.
There is no internal mammary, mediastinal, hilar or right axillary
lymphadenopathy.

There is a suggested irregular mass deep within the right breast
based on the coronal and sagittal images. However, this does not
appear significantly changed from the prior PET-CT based on the
axial CT images. This was mildly hypermetabolic on prior PET-CT.

The heart size is stable. There is stable atherosclerosis of the
aorta, great vessels and coronary arteries. There is a large hiatal
hernia containing most of the stomach.

No significant pleural or pericardial effusion is present. There has
been interval development of multiple small pulmonary nodules
consistent with metastatic disease. Representative nodules include a
4 mm right upper lobe nodule on image 18, a 9 mm nodule along the
right major fissure on image 28 and a 4 mm left upper lobe nodule on
image 21.

Images through the upper abdomen demonstrated apparent new
ill-defined low-density lesion posteriorly in the right hepatic lobe
measuring 1.5 cm on image 48, suspicious for metastatic disease. No
other liver lesions are apparent. There is no adrenal mass.

There are no worrisome osseous findings.
IMPRESSION: 1. Progressive enlargement of dominant left breast mass with
multiple new breasts masses consistent with metastases.
2. Deep mass in the right breast has not significantly changed.
3. Multiple new pulmonary nodules consistent with metastatic
disease.
4. New hepatic lesions suspicious for metastatic disease.
